# Patient Record
Sex: Male | Born: 1945
Health system: Southern US, Community
[De-identification: ages and names within clinical notes are randomized; demographics above are authoritative.]

## PROBLEM LIST (undated history)

## (undated) DIAGNOSIS — R918 Other nonspecific abnormal finding of lung field: Secondary | ICD-10-CM

## (undated) DIAGNOSIS — R911 Solitary pulmonary nodule: Secondary | ICD-10-CM

## (undated) DIAGNOSIS — C3490 Malignant neoplasm of unspecified part of unspecified bronchus or lung: Secondary | ICD-10-CM

## (undated) DIAGNOSIS — R112 Nausea with vomiting, unspecified: Secondary | ICD-10-CM

## (undated) DIAGNOSIS — Z9889 Other specified postprocedural states: Secondary | ICD-10-CM

## (undated) DIAGNOSIS — I509 Heart failure, unspecified: Secondary | ICD-10-CM

## (undated) DIAGNOSIS — J449 Chronic obstructive pulmonary disease, unspecified: Secondary | ICD-10-CM

## (undated) HISTORY — PX: BACK SURGERY: SHX140

## (undated) HISTORY — DX: Solitary pulmonary nodule: R91.1

## (undated) HISTORY — DX: Other nonspecific abnormal finding of lung field: R91.8

## (undated) HISTORY — DX: Malignant neoplasm of unspecified part of unspecified bronchus or lung: C34.90

---

## 2010-07-06 ENCOUNTER — Other Ambulatory Visit: Payer: Self-pay | Admitting: Family Medicine

## 2010-07-06 DIAGNOSIS — R918 Other nonspecific abnormal finding of lung field: Secondary | ICD-10-CM

## 2010-07-12 ENCOUNTER — Encounter (HOSPITAL_COMMUNITY)
Admission: RE | Admit: 2010-07-12 | Discharge: 2010-07-12 | Disposition: A | Discharge status: Home / Self Care | Payer: Medicare Other | Source: Ambulatory Visit | Attending: Family Medicine | Admitting: Family Medicine

## 2010-07-12 DIAGNOSIS — J984 Other disorders of lung: Secondary | ICD-10-CM | POA: Insufficient documentation

## 2010-07-12 DIAGNOSIS — R918 Other nonspecific abnormal finding of lung field: Secondary | ICD-10-CM

## 2010-07-12 LAB — GLUCOSE, CAPILLARY: Glucose-Capillary: 118 mg/dL — ABNORMAL HIGH (ref 70–99)

## 2010-07-12 MED ORDER — FLUDEOXYGLUCOSE F - 18 (FDG) INJECTION
15.0000 | Freq: Once | INTRAVENOUS | Status: AC | PRN
  Administered 2010-07-12: 15 via INTRAVENOUS

## 2010-07-18 ENCOUNTER — Encounter: Payer: Self-pay | Admitting: Thoracic Surgery

## 2010-07-19 ENCOUNTER — Encounter (INDEPENDENT_AMBULATORY_CARE_PROVIDER_SITE_OTHER): Payer: Medicare Other | Admitting: Thoracic Surgery

## 2010-07-19 DIAGNOSIS — D381 Neoplasm of uncertain behavior of trachea, bronchus and lung: Secondary | ICD-10-CM

## 2010-07-20 NOTE — Letter (Signed)
July 19, 2010  Ernestina Penna, MD [address] [place], [place] [address]  Re:  KAY, RICCIUTI               DOB:  May 30, 1945  Dear Roe Coombs,  I appreciate the opportunity of seeing the patient.  This 65 year old patient was found to have a left lower lobe nodule that is spiculated and approximately 10 cm positive in size.  This was seen on CT scan. There was some other small nodules in the base of the left lower lobe and right lower lobe.  He underwent a PET scan and the left lower lobe nodule was positive, the other nodules being negative.  The 8-mm nodule had an uptake value of 4.6.  He is referred here for evaluation.  He still smokes quarter pack of cigarettes a day.  He has had no hemoptysis, fever, chills, or excessive sputum.  He says he can barely get up a flight of stairs.  MEDICATIONS: 1. He is on Celebrex 200 mg a day. 2. Spiriva 18 mcg a day. 3. Ventolin. 4. Cymbalta 30 mg daily. 5. Flexeril p.r.n. 6. Aspirin.  He has no allergies.  He has severe COPD.  FAMILY HISTORY:  Negative.  SOCIAL HISTORY:  Single, has four children.  Smokes one half to one pack a day, does not drink alcohol on a regular basis.  REVIEW OF SYSTEMS:  He has had some weight loss. CARDIAC:  He has severe shortness of breath.  No angina. PULMONARY:  See history of present illness. GI:  No reflux. GU:  No kidney disease, dysuria, or frequent urination. VASCULAR:  No claudication, DVT, TIAs. NEUROLOGICAL:  He has got headaches. MUSCULOSKELETAL:  Arthritis. PSYCHIATRIC:  Anxiety. EYE AND ENT:  No changes in eyesight or hearing. HEMATOLOGICAL:  No problems with bleeding, clotting disorders, or anemia.  On physical exam, he is a thin Caucasian male in no acute distress.  His blood pressure is 132/84, pulse 92, respirations 20, sats were 97%. Head, eyes, ears, nose, and throat are unremarkable.  Neck is supple without thyromegaly.  He has no supraclavicular or axillary adenopathy. Chest:   Distant breath sounds with some bilateral wheezes.  Heart: Regular sinus rhythm.  Abdomen is soft.  No hepatosplenomegaly. Extremities:  Pulses 2+.  There is no clubbing or edema. Neurologically, he is oriented x3.  His sensory and motor is intact. Cranial nerves intact.  I think that probably he has an early lung cancer.  The problem would be getting a diagnosis of this without the surgery.  I will plan to get pulmonary function tests with DLCO and a brain scan and then we will see him back again next week for further evaluation.  Ines Bloomer, M.D. Electronically Signed  DPB/MEDQ  D:  07/19/2010  T:  07/20/2010  Job:  [id number]  cc:   Belva Agee, Nurse Practitioner

## 2010-07-26 ENCOUNTER — Ambulatory Visit (HOSPITAL_COMMUNITY)
Admission: RE | Admit: 2010-07-26 | Discharge: 2010-07-26 | Disposition: A | Discharge status: Home / Self Care | Payer: Medicare Other | Source: Ambulatory Visit | Attending: Thoracic Surgery | Admitting: Thoracic Surgery

## 2010-07-26 ENCOUNTER — Ambulatory Visit (INDEPENDENT_AMBULATORY_CARE_PROVIDER_SITE_OTHER): Payer: Medicare Other | Admitting: Thoracic Surgery

## 2010-07-26 ENCOUNTER — Ambulatory Visit: Payer: Medicare Other | Admitting: Thoracic Surgery

## 2010-07-26 DIAGNOSIS — J984 Other disorders of lung: Secondary | ICD-10-CM | POA: Insufficient documentation

## 2010-07-26 DIAGNOSIS — D381 Neoplasm of uncertain behavior of trachea, bronchus and lung: Secondary | ICD-10-CM

## 2010-07-27 NOTE — Assessment & Plan Note (Signed)
OFFICE VISIT  ELIAB, CLOSSON DOB:  1946/01/05                                        July 26, 2010 CHART #:  [id number]  I saw the patient back today and his brain scan is negative and his PET scan shows that he had a spiculated nodule in the left lower lobe.  His pulmonary function test showed an FVC of 1.81 or 51% of predicted and FVC of 4.08 or 86% of predicted with diffusion capacity corrected 52%. He is a candidate for at least lobe segmentectomy or possible lobectomy and we will schedule him for this to be done on Aug 24, 2010, at [facility].  I appreciate the opportunity of seeing the patient.  Ines Bloomer, M.D. Electronically Signed  DPB/MEDQ  D:  07/26/2010  T:  07/27/2010  Job:  [id number]  cc:   Ernestina Penna, M.D.

## 2010-08-22 ENCOUNTER — Ambulatory Visit (HOSPITAL_COMMUNITY)
Admission: RE | Admit: 2010-08-22 | Discharge: 2010-08-22 | Disposition: A | Discharge status: Home / Self Care | Payer: Medicare Other | Source: Ambulatory Visit | Attending: Thoracic Surgery | Admitting: Thoracic Surgery

## 2010-08-22 ENCOUNTER — Other Ambulatory Visit: Payer: Self-pay | Admitting: Thoracic Surgery

## 2010-08-22 ENCOUNTER — Encounter (HOSPITAL_COMMUNITY)
Admission: RE | Admit: 2010-08-22 | Discharge: 2010-08-22 | Disposition: A | Discharge status: Home / Self Care | Payer: Medicare Other | Source: Ambulatory Visit | Attending: Thoracic Surgery | Admitting: Thoracic Surgery

## 2010-08-22 DIAGNOSIS — Z01812 Encounter for preprocedural laboratory examination: Secondary | ICD-10-CM | POA: Insufficient documentation

## 2010-08-22 DIAGNOSIS — Z01811 Encounter for preprocedural respiratory examination: Secondary | ICD-10-CM

## 2010-08-22 DIAGNOSIS — Z01810 Encounter for preprocedural cardiovascular examination: Secondary | ICD-10-CM | POA: Insufficient documentation

## 2010-08-22 DIAGNOSIS — Z01818 Encounter for other preprocedural examination: Secondary | ICD-10-CM | POA: Insufficient documentation

## 2010-08-22 DIAGNOSIS — J984 Other disorders of lung: Secondary | ICD-10-CM | POA: Insufficient documentation

## 2010-08-22 LAB — COMPREHENSIVE METABOLIC PANEL
ALT: 9 U/L (ref 0–53)
Alkaline Phosphatase: 73 U/L (ref 39–117)
BUN: 12 mg/dL (ref 6–23)
CO2: 23 mEq/L (ref 19–32)
GFR calc non Af Amer: >60 mL/min (ref >60)
Glucose, Bld: 107 mg/dL — ABNORMAL HIGH (ref 70–99)
Potassium: 4.1 mEq/L (ref 3.5–5.1)
Sodium: 138 mEq/L (ref 135–145)
Total Bilirubin: 0.4 mg/dL (ref 0.3–1.2)
Total Protein: 6.3 g/dL (ref 6.0–8.3)

## 2010-08-22 LAB — URINALYSIS, ROUTINE W REFLEX MICROSCOPIC
Glucose, UA: NEGATIVE mg/dL
Hgb urine dipstick: NEGATIVE
Specific Gravity, Urine: 1.012 (ref 1.005–1.030)
Urobilinogen, UA: 0.2 mg/dL (ref 0.0–1.0)

## 2010-08-22 LAB — CBC
Platelets: 129 10*3/uL — ABNORMAL LOW (ref 150–400)
RBC: 5.15 MIL/uL (ref 4.22–5.81)
WBC: 7.7 10*3/uL (ref 4.0–10.5)

## 2010-08-22 LAB — ABO/RH: ABO/RH(D): A POS

## 2010-08-22 LAB — SURGICAL PCR SCREEN: Staphylococcus aureus: NEGATIVE

## 2010-08-22 LAB — BLOOD GAS, ARTERIAL
Acid-Base Excess: 0.5 mmol/L (ref 0.0–2.0)
Bicarbonate: 24 mEq/L (ref 20.0–24.0)
O2 Saturation: 97.8 %
pCO2 arterial: 35.1 mmHg (ref 35.0–45.0)
pO2, Arterial: 88.8 mmHg (ref 80.0–100.0)

## 2010-08-22 LAB — PROTIME-INR
INR: 1.07 (ref 0.00–1.49)
Prothrombin Time: 14.1 seconds (ref 11.6–15.2)

## 2010-08-22 LAB — TYPE AND SCREEN: Antibody Screen: NEGATIVE

## 2010-08-22 NOTE — H&P (Signed)
Stephen Fitzpatrick, Stephen Fitzpatrick               ACCOUNT NO.:  [id number]  MEDICAL RECORD NO.:  [id number]           PATIENT TYPE:  I  LOCATION:  [place]                         FACILITY:  [facility]  PHYSICIAN:  Ines Bloomer, M.D. DATE OF BIRTH:  03-09-1946  DATE OF ADMISSION:  08/24/2010 DATE OF DISCHARGE:                             HISTORY & PHYSICAL   CHIEF COMPLAINT:  Left lower lobe lung mass.  HISTORY OF PRESENT ILLNESS:  This 65 year old patient is a long-term smoker who was found to have a left lower lobe mass 10 mm in size. Standard uptake value of 4.8 on a PET scan that measured 8-10 mm in size.  He has had no hemoptysis, fever, chills, or excessive sputum, but he says he can get up a flight of stairs without stopping, but just barely.  His pulmonary function tests showed an FVC of 4.08 or 80% predicted and FEV-1 of 1.81 or 51% predicted and a diffusion capacity of 52%.  MEDICATIONS: 1. Celebrex 200 mg daily. 2. Spiriva 18 mcg daily. 3. Ventolin. 4. Cymbalta 30 mg daily. 5. Flexeril. 6. Aspirin.  ALLERGIES:  He has had no allergies.  PAST MEDICAL HISTORY:  He has severe chronic obstructive pulmonary disease.  FAMILY HISTORY:  Noncontributory.  SOCIAL HISTORY:  He has 4 children.  He does smoke a half a pack to a pack of cigarettes a day and he is supposed to quit.  He does not drink alcohol on a regular basis.  REVIEW OF SYSTEMS:  He has some weight loss.  CARDIAC:  Severe shortness of breath.  No angina.  PULMONARY:  See history of present illness.  GI: No reflux.  GU:  No kidney disease, dysuria, or frequent urination. VASCULAR:  No claudication, DVT, or TIAs.  NEUROLOGICAL:  He has headaches.  MUSCULOSKELETAL:  Arthritis and psychiatric anxiety. EYES/ENT:  No changes in eyesight or hearing.  HEMATOLOGICAL:  No problems with bleeding, clotting disorders, or anemia.  PHYSICAL EXAMINATION:  GENERAL:  He is a Caucasian male, in no acute distress. VITAL SIGNS:  Blood  pressure was 132/80, pulse 90, respirations 18, and sats were 96%. HEAD:  Atraumatic. EYES:  Pupils are equal and reactive to light and accommodation. Extraocular movements are normal EARS:  Tympanic membranes are intact. NOSE:  There is no septal deviation. THROAT:  Without lesion.  Uvula is in midline. NECK:  Supple without thyromegaly.  There is no supraclavicular or axillary adenopathy. CHEST:  Clear to auscultation bilaterally.  She has bilateral wheezes with distant breath sounds. HEART:  Regular sinus rhythm.  No murmurs. ABDOMEN:  Soft with no hepatosplenomegaly. EXTREMITIES:  Pulses 2+.  There is no clubbing or edema. NEUROLOGICAL:  He is oriented x3.  Sensory and motor intact.  Cranial nerves intact.  IMPRESSION: 1. Left lower lobe mass, positive on PET. 2. History of tobacco abuse. 3. Mild-to-severe chronic obstructive pulmonary disease.  PLAN:  Left VATS, Wedge versus segmentectomy of left lower lobe lesion.     Ines Bloomer, M.D.     DPB/MEDQ  D:  08/21/2010  T:  08/22/2010  Job:  [id number]  Electronically Signed by  Jovita Gamma M.D. on 08/22/2010 12:03:15 PM

## 2010-08-24 ENCOUNTER — Inpatient Hospital Stay (HOSPITAL_COMMUNITY): Payer: Medicare Other

## 2010-08-24 ENCOUNTER — Inpatient Hospital Stay (HOSPITAL_COMMUNITY)
Admission: RE | Admit: 2010-08-24 | Discharge: 2010-09-02 | DRG: 164 | Disposition: A | Discharge status: Home Health | Payer: Medicare Other | Source: Ambulatory Visit | Attending: Thoracic Surgery | Admitting: Thoracic Surgery

## 2010-08-24 ENCOUNTER — Other Ambulatory Visit: Payer: Self-pay | Admitting: Thoracic Surgery

## 2010-08-24 DIAGNOSIS — I10 Essential (primary) hypertension: Secondary | ICD-10-CM | POA: Diagnosis not present

## 2010-08-24 DIAGNOSIS — F17200 Nicotine dependence, unspecified, uncomplicated: Secondary | ICD-10-CM | POA: Diagnosis present

## 2010-08-24 DIAGNOSIS — C3430 Malignant neoplasm of lower lobe, unspecified bronchus or lung: Principal | ICD-10-CM | POA: Diagnosis present

## 2010-08-24 DIAGNOSIS — J4489 Other specified chronic obstructive pulmonary disease: Secondary | ICD-10-CM | POA: Diagnosis present

## 2010-08-24 DIAGNOSIS — D381 Neoplasm of uncertain behavior of trachea, bronchus and lung: Secondary | ICD-10-CM

## 2010-08-24 DIAGNOSIS — F19951 Other psychoactive substance use, unspecified with psychoactive substance-induced psychotic disorder with hallucinations: Secondary | ICD-10-CM | POA: Diagnosis not present

## 2010-08-24 DIAGNOSIS — J449 Chronic obstructive pulmonary disease, unspecified: Secondary | ICD-10-CM | POA: Diagnosis present

## 2010-08-24 HISTORY — PX: OTHER SURGICAL HISTORY: SHX169

## 2010-08-24 HISTORY — DX: Chronic obstructive pulmonary disease, unspecified: J44.9

## 2010-08-25 ENCOUNTER — Inpatient Hospital Stay (HOSPITAL_COMMUNITY): Payer: Medicare Other

## 2010-08-25 LAB — POCT I-STAT 3, ART BLOOD GAS (G3+)
Acid-Base Excess: 1 mmol/L (ref 0.0–2.0)
O2 Saturation: 87 %
Patient temperature: 98.5
TCO2: 28 mmol/L (ref 0–100)
pCO2 arterial: 44.6 mmHg (ref 35.0–45.0)

## 2010-08-25 LAB — BASIC METABOLIC PANEL
BUN: 6 mg/dL (ref 6–23)
Calcium: 8.1 mg/dL — ABNORMAL LOW (ref 8.4–10.5)
Chloride: 100 mEq/L (ref 96–112)
Creatinine, Ser: 0.74 mg/dL (ref 0.4–1.5)
GFR calc Af Amer: >60 mL/min (ref >60)

## 2010-08-25 LAB — CBC
MCH: 28.1 pg (ref 26.0–34.0)
MCHC: 32.9 g/dL (ref 30.0–36.0)
MCV: 85.5 fL (ref 78.0–100.0)
Platelets: 110 10*3/uL — ABNORMAL LOW (ref 150–400)
RBC: 4.13 MIL/uL — ABNORMAL LOW (ref 4.22–5.81)
RDW: 13.8 % (ref 11.5–15.5)

## 2010-08-26 ENCOUNTER — Inpatient Hospital Stay (HOSPITAL_COMMUNITY): Payer: Medicare Other

## 2010-08-26 LAB — CBC
MCV: 85.5 fL (ref 78.0–100.0)
Platelets: 130 10*3/uL — ABNORMAL LOW (ref 150–400)
RBC: 4.55 MIL/uL (ref 4.22–5.81)
RDW: 13.7 % (ref 11.5–15.5)
WBC: 11 10*3/uL — ABNORMAL HIGH (ref 4.0–10.5)

## 2010-08-26 LAB — COMPREHENSIVE METABOLIC PANEL
AST: 20 U/L (ref 0–37)
Albumin: 2.8 g/dL — ABNORMAL LOW (ref 3.5–5.2)
Alkaline Phosphatase: 65 U/L (ref 39–117)
BUN: 7 mg/dL (ref 6–23)
Creatinine, Ser: 0.7 mg/dL (ref 0.4–1.5)
GFR calc Af Amer: >60 mL/min (ref >60)
Potassium: 3.9 mEq/L (ref 3.5–5.1)
Total Protein: 5.8 g/dL — ABNORMAL LOW (ref 6.0–8.3)

## 2010-08-27 ENCOUNTER — Inpatient Hospital Stay (HOSPITAL_COMMUNITY): Payer: Medicare Other

## 2010-08-28 ENCOUNTER — Inpatient Hospital Stay (HOSPITAL_COMMUNITY): Payer: Medicare Other

## 2010-08-28 NOTE — Op Note (Signed)
  NAMERONNALD, SHEDDEN               ACCOUNT NO.:  [id number]  MEDICAL RECORD NO.:  [id number]           PATIENT TYPE:  I  LOCATION:  2304                         FACILITY:  [facility]  PHYSICIAN:  Ines Bloomer, M.D. DATE OF BIRTH:  25-Sep-1945  DATE OF PROCEDURE: DATE OF DISCHARGE:                              OPERATIVE REPORT   PREOPERATIVE DIAGNOSES:  Left upper lobe mass, a left lower lobe nodule, severe chronic obstructive pulmonary disease.  POSTOPERATIVE DIAGNOSES:  Left upper lobe mass, a left lower lobe nodule, severe chronic obstructive pulmonary disease.  OPERATIONS PERFORMED:  Left video-assisted thoracic surgery, resection of left lower lobe lesion with node sampling.  SURGEON:  Ines Bloomer, MD  FIRST ASSISTANT:  Coral Ceo, PA-C  ANESTHESIA:  General.  This 65 year old smoker was found to have a left lower lobe lesion that was positive on pad.  It was in the superior segment.  His pulmonary function test was satisfactory for lobectomy but he had evidence of severe chronic obstructive pulmonary disease.  He underwent general anesthesia.  He was turned left lateral thoracotomy position, was prepped and draped in usual sterile manner.  Dual-lumen tube was inserted in the left lung was deflated.  Two trocar sites were made in the anterior and posterior axons seventh intercostal space.  Two trocars were inserted and then a third 4-5 cm incision was made at the fifth intercostal space.  The latissimus was partially divided.  The serratus was split and the lesion was palpated in the posterior and superior segment of the left lower lobe.  We then took a Games developer with a Seamguard reinforcement and fired it along the staple line and then came in laterally with Covidien stapler with Seamguard reinforcement and resected the lesion with at least 2.5 cm margins around it.  However at the end of the staple line immediately in the fissure, there was tearing in the  left upper lobe and we had to repair this with horizontal mattress pledgeted sutures with 3-0 Prolene and this reduced the air leak considerably.  We then went posteriorly and dissected out 5 lymph node superior from the aortopulmonary window.  Then down along the trachea we dissected a 11L node and then more medially a 4L node.  It has sent for analysis and we then put ProGEL and CoSeal  to the staple lines to decrease the air leak as much as possible.  Two chest tubes were placed, a 24 anteriorly and 28 posteriorly.  Single On-Q was inserted in the usual fashion.  The chest was closed with 2 pericostals, #1 Vicryl in the muscle layer, 3-0 Vicryl subcutaneous stitch.  The patient was returned to recovery room in stable condition.     Ines Bloomer, M.D.     DPB/MEDQ  D:  08/24/2010  T:  08/24/2010  Job:  [id number]  Electronically Signed by Jovita Gamma M.D. on 08/28/2010 02:19:00 PM

## 2010-08-29 ENCOUNTER — Inpatient Hospital Stay (HOSPITAL_COMMUNITY): Payer: Medicare Other

## 2010-08-30 ENCOUNTER — Inpatient Hospital Stay (HOSPITAL_COMMUNITY): Payer: Medicare Other

## 2010-08-30 LAB — COMPREHENSIVE METABOLIC PANEL
ALT: 9 U/L (ref 0–53)
AST: 15 U/L (ref 0–37)
Albumin: 2.5 g/dL — ABNORMAL LOW (ref 3.5–5.2)
Alkaline Phosphatase: 87 U/L (ref 39–117)
Glucose, Bld: 137 mg/dL — ABNORMAL HIGH (ref 70–99)
Potassium: 4.2 mEq/L (ref 3.5–5.1)
Sodium: 132 mEq/L — ABNORMAL LOW (ref 135–145)
Total Protein: 6.2 g/dL (ref 6.0–8.3)

## 2010-08-30 LAB — CBC
HCT: 41.8 % (ref 39.0–52.0)
RDW: 13.4 % (ref 11.5–15.5)
WBC: 23.8 10*3/uL — ABNORMAL HIGH (ref 4.0–10.5)

## 2010-08-31 ENCOUNTER — Inpatient Hospital Stay (HOSPITAL_COMMUNITY): Payer: Medicare Other

## 2010-08-31 ENCOUNTER — Encounter (HOSPITAL_COMMUNITY): Payer: Self-pay | Admitting: Radiology

## 2010-08-31 MED ORDER — IOHEXOL 300 MG/ML  SOLN
75.0000 mL | Freq: Once | INTRAMUSCULAR | Status: AC | PRN
  Administered 2010-08-31: 75 mL via INTRAVENOUS

## 2010-09-01 ENCOUNTER — Inpatient Hospital Stay (HOSPITAL_COMMUNITY): Payer: Medicare Other

## 2010-09-01 LAB — CBC
HCT: 34 % — ABNORMAL LOW (ref 39.0–52.0)
Hemoglobin: 11.4 g/dL — ABNORMAL LOW (ref 13.0–17.0)
MCH: 27.5 pg (ref 26.0–34.0)
RBC: 4.14 MIL/uL — ABNORMAL LOW (ref 4.22–5.81)

## 2010-09-01 LAB — COMPREHENSIVE METABOLIC PANEL
ALT: 9 U/L (ref 0–53)
AST: 15 U/L (ref 0–37)
CO2: 30 mEq/L (ref 19–32)
Chloride: 93 mEq/L — ABNORMAL LOW (ref 96–112)
GFR calc Af Amer: >60 mL/min (ref >60)
GFR calc non Af Amer: >60 mL/min (ref >60)
Glucose, Bld: 97 mg/dL (ref 70–99)
Sodium: 131 mEq/L — ABNORMAL LOW (ref 135–145)
Total Bilirubin: 0.4 mg/dL (ref 0.3–1.2)

## 2010-09-02 ENCOUNTER — Inpatient Hospital Stay (HOSPITAL_COMMUNITY): Payer: Medicare Other

## 2010-09-02 LAB — CBC
MCHC: 33.4 g/dL (ref 30.0–36.0)
Platelets: 250 10*3/uL (ref 150–400)
RDW: 13.7 % (ref 11.5–15.5)

## 2010-09-05 ENCOUNTER — Other Ambulatory Visit: Payer: Self-pay | Admitting: Thoracic Surgery

## 2010-09-05 DIAGNOSIS — D381 Neoplasm of uncertain behavior of trachea, bronchus and lung: Secondary | ICD-10-CM

## 2010-09-06 ENCOUNTER — Ambulatory Visit: Payer: Self-pay | Admitting: Thoracic Surgery

## 2010-09-06 NOTE — Discharge Summary (Signed)
Stephen Fitzpatrick, Stephen Fitzpatrick               ACCOUNT NO.:  [id number]  MEDICAL RECORD NO.:  [id number]           PATIENT TYPE:  I  LOCATION:  3309                         FACILITY:  [facility]  PHYSICIAN:  Ines Bloomer, M.D. DATE OF BIRTH:  1945-08-08  DATE OF ADMISSION:  08/24/2010 DATE OF DISCHARGE:  09/01/2010                              DISCHARGE SUMMARY   ADMITTING DIAGNOSES: 1. Left lower lobe lung mass. 2. History of chronic obstructive pulmonary disease. 3. History of tobacco abuse.  DISCHARGE DIAGNOSES: 1. Adenocarcinoma of the left lower lobe. 2. History of chronic obstructive pulmonary disease. 3. History of tobacco abuse.  PROCEDURE:  Left video-assisted thoracoscopic surgery, left mini thoracotomy, left lower lobe superior segmentectomy with lymph node dissection and On-Q placement by Dr. Edwyna Shell on Aug 24, 2010.  PATHOLOGY RESULTS:  Invasive well differentiated adenocarcinoma of the left lower lobe, margins negative for tumor.  Lymph nodes also negative for tumor.  HISTORY OF PRESENT ILLNESS:  This is a 65 year old Caucasian male who was initially seen and evaluated by Dr. Edwyna Shell in the office on July 19, 2010, regarding a left lower lobe nodule (that was positive on PET scan).  Specifically, the patient had an SUV max of 4.6 of the left lower lobe nodule which measured 7 mm and was spiculated on PET scan. There was no evidence of mediastinal metastasis or distant metastasis but there was right perihilar linear metabolic activity that was indeterminate and might be inflammatory.  The patient then underwent PFTs.  Results indicated an FVC of 1.81 or 51% of predicted, an FVC of 4.08 or 86% of predicted and a diffusion capacity corrected to 52%.  He was found to be a candidate for at least a left lower lobe segmentectomy and a possible lobectomy (if needed).  Potential risks, complications and benefits of the surgery were discussed with the patient who agreed to proceed  with surgery and was admitted to [facility] on Aug 24, 2010, in order to undergo a left mini thoracotomy, left lower lobe superior segmentectomy with lymph node dissection by Dr. Edwyna Shell.  BRIEF HOSPITAL COURSE STAY:  The patient remained afebrile and hemodynamically stable.  He was found initially to have a small air leak.  His A-line and Foley were removed early in his postop course.  He was transferred from Intensive Care into 3300 for further convalescence later on Aug 25, 2010.  He then developed a 2-3 air leak with cough.  As a result, chest tubes remained in for a couple of days.  His air leak did gradually diminish.  His posterior chest tube was removed on Aug 29, 2010.  Chest x-ray revealed a questionable tiny left apical pneumothorax with some subcutaneous emphysema on the left lateral chest wall, remaining chest tube was placed to [facility].  There was no obvious air leak.  His chest x-ray remained stable and the remaining chest tube was removed on Aug 31, 2010.  It should be noted that the patient did develop some confusion and hallucinations.  All narcotics had  been discontinued and he was given either Tylenol or Ultram p.r.n. for pain.  He  was  initially placed on Xanax but that was discontinued.  He was then placed on Ativan.  He remained anxious and confused.  The Ativan was changed to p.r.n. and he was given Haldol; however, he still remained pleasantly confused and with hallucinations at times.    He has no history of alcohol abuse and his neuro exam shows no focal deficits.  His last white count was found to be increased at 23,800 on 08/30/2010.  He remained afebrile.  No signs of wound infection and no GU complaints.  A CBC will be checked in the morning .  In addition, the patient did have tachycardia.  He was started on low-dose Lopressor, however, this needed to be increased today as he remained tachycardic.  He is currently on 25 mg p.o. two times daily and will  be discharged home on this.  Currently on postop day #7, he has been tolerating a diet.  He is ambulating with assistance.  He is passing flatus but has not had a bowel movement and will be given lactulose for this.  Provided he remains afeb, cxr remains stable and his is less confused and without hallucinations, he will be able to be discharged in the am or Saturday.  PHYSICAL EXAMINATION:  VITAL SIGNS:  Afebrile, heart rate 100, BP 129/94, O2 92% on room air. CARDIOVASCULAR:  Tachycardic. PULMONARY:  Decreased on left base, right lung is clear. ABDOMEN:  Soft, nontender.  Bowel sounds are present. EXTREMITIES:  No lower extremity edema.  Dressing on his wound is clean and dry.  Provided he remains afebrile and hemodynamically stable chest x-ray remains stable.  On pending morning round evaluation was surgically stable for discharge on Sep 01, 2010.  LATEST LABORATORY STUDIES:  CMP done on Aug 30, 2010, potassium 4.2, sodium 132, BUN and creatinine 18 and 0.72 respectively.  LFTs within normal limits.  CBC done on this date H ands H 14.1 and 41.1 respectively, white count of 23,800, platelet count 214,000.  Last chest x-ray done on Aug 31, 2010, showed no pneumothorax, underlying COPD, subcutaneous emphysema left chest wall as before.  DISCHARGE INSTRUCTIONS:  Diet: Regular diet.  Activity:  May walk up steps.  He may shower.  He is not to lift more than 10 pounds for 2 weeks.  He is not to drive until after 2 weeks.  He is to continue with his breathing exercise daily, needs to walk daily, and increase his frequency of duration as he tolerates.  FOLLOWUP APPOINTMENTS:  He is to see Dr. Edwyna Shell on Sep 06, 2010, at 1:15 p.m.  45 minutes prior to this office appointment a chest x-ray will be obtained.  Chest tube sutures will be removed at this appointment.  WOUND CARE:  The patient is to use soap and water on his wounds.  He is to contact the office if any wound problems  arise.  DISCHARGE MEDICATIONS:  At the time of this dictation include: 1. Lopressor 25 mg p.o. two times daily. 2. Ultram 50 mg 1-2 tablets p.o. q.6 h. p.r.n. pain. 3. Celebrex 200 mg p.o. daily. 4. Cyclobenzaprine 10 mg p.o. two times daily. 5. Cymbalta 30 mg p.o. daily. 6. Rolaids over-the-counter 1 tablet p.o. daily p.r.n. 7. Spiriva 18 mcg inhaled daily. 8. Ventolin inhaler 2 puffs inhaled four times daily p.r.n.     Doree Fudge, PA   ______________________________ Ines Bloomer, M.D.    DZ/MEDQ  D:  08/31/2010  T:  09/01/2010  Job:  [id number]  cc:   Ernestina Penna, M.D.  Electronically Signed by Doree Fudge PA on 09/01/2010 08:54:06 AM Electronically Signed by Jovita Gamma M.D. on 09/06/2010 10:54:14 AM

## 2010-09-07 ENCOUNTER — Ambulatory Visit (INDEPENDENT_AMBULATORY_CARE_PROVIDER_SITE_OTHER): Payer: Self-pay | Admitting: Thoracic Surgery

## 2010-09-07 ENCOUNTER — Other Ambulatory Visit: Payer: Self-pay | Admitting: Thoracic Surgery

## 2010-09-07 ENCOUNTER — Ambulatory Visit
Admission: RE | Admit: 2010-09-07 | Discharge: 2010-09-07 | Disposition: A | Discharge status: Home / Self Care | Payer: Medicare Other | Source: Ambulatory Visit | Attending: Thoracic Surgery | Admitting: Thoracic Surgery

## 2010-09-07 DIAGNOSIS — C3490 Malignant neoplasm of unspecified part of unspecified bronchus or lung: Secondary | ICD-10-CM

## 2010-09-07 DIAGNOSIS — D381 Neoplasm of uncertain behavior of trachea, bronchus and lung: Secondary | ICD-10-CM

## 2010-09-08 NOTE — Assessment & Plan Note (Signed)
OFFICE VISIT  GOVERNOR, MATOS DOB:  1945-08-06                                        Sep 07, 2010 CHART #:  [id number]  HISTORY:  The patient is a 65 year old smoker who underwent a left video- assisted thoracoscopic surgery with resection of a left lower lobe lesion and lymph node sampling done on Aug 24, 2010.  This revealed an invasive well-differentiated adenocarcinoma 1.1 cm with no lymphatic spread.  The margins were also negative and no lymphovascular invasion was identified.  Currently, he reports that he is feeling quite poorly. He has a poor appetite.  He has some constipation.  He denies fevers, chills or other constitutional symptoms.  He has a slightly productive cough.  It is yellow sputum.  He has had some drainage from a chest tube site, which is purulent in nature.  He reports a fairly poor appetite. He is drinking fluids without significant difficulty.  Chest x-ray was obtained on today's date.  Does reveal some increase in the left side fluid with increased airspace disease.  LABORATORY DATA:  A CBC was obtained on today's day with a white blood cell count of 26,500, H and H of 12.5 and 38.8  PHYSICAL EXAMINATION:  Vital Signs:  Blood pressure is 122/81, pulse 96, respirations 20, oxygen saturation is 94% on room air, temperature is 97.4.  General Appearance:  This is a somewhat cachectic-appearing, chronically ill male who is in no distress.  Pulmonary examination reveals decreased breath sounds on the left side compared to right, but only fair air exchange throughout.  Cardiac examination, regular rate and rhythm.  A chest tube site does have moderate purulent drainage, brown in color.  There is minimal surrounding erythema.  The chest incision itself is healing well.  Extremities:  No edema.  ASSESSMENT:  The patient does appear to have a chest tube tract site infection with possible pneumonia.  He has significant leukocytosis.   A discussed the overall situation with the patient and he is hoping to remain treated as an outpatient.  I discuss that he is showing moderate degree of failure to thrive.  Basically, I discussed that he is very borderline as to the ability to treat this as an outpatient.  He does wish to try.  I will place him on oral Avelox 400 mg p.o. daily for 7 days.  Additionally, I instructed him on some over-the-counter medications for constipation as well as Ensure oral supplements.  I told him that he needs to be in close contact with our office if he feels worse or is not improving with this treatment.  He is on a very tight observation, and we will see him again in the office on next Tuesday if not prior depending on how he feels.  Rowe Clack, P.A.-C.  Sherryll Burger  D:  09/07/2010  T:  09/08/2010  Job:  [id number]  cc:   Ines Bloomer, M.D.

## 2010-09-11 ENCOUNTER — Other Ambulatory Visit: Payer: Self-pay | Admitting: Thoracic Surgery

## 2010-09-11 DIAGNOSIS — D381 Neoplasm of uncertain behavior of trachea, bronchus and lung: Secondary | ICD-10-CM

## 2010-09-12 ENCOUNTER — Ambulatory Visit
Admission: RE | Admit: 2010-09-12 | Discharge: 2010-09-12 | Disposition: A | Discharge status: Home / Self Care | Payer: Medicare Other | Source: Ambulatory Visit | Attending: Thoracic Surgery | Admitting: Thoracic Surgery

## 2010-09-12 ENCOUNTER — Ambulatory Visit (INDEPENDENT_AMBULATORY_CARE_PROVIDER_SITE_OTHER): Payer: Self-pay | Admitting: Thoracic Surgery

## 2010-09-12 DIAGNOSIS — C3490 Malignant neoplasm of unspecified part of unspecified bronchus or lung: Secondary | ICD-10-CM

## 2010-09-12 DIAGNOSIS — D381 Neoplasm of uncertain behavior of trachea, bronchus and lung: Secondary | ICD-10-CM

## 2010-09-13 NOTE — Assessment & Plan Note (Signed)
OFFICE VISIT  Stephen Fitzpatrick, Stephen Fitzpatrick DOB:  11/19/1945                                        September 12, 2010 CHART #:  [id number]  Mr. Stephen Fitzpatrick came for followup today.  He is still draining purulent material from the site of the posterior chest tube.His chest x-ray shows a decreased in air fluids levels posteriorly which was probably has been drained through the chest tube site.  He was seen in the emergency room and probably his blood pressure was 134/79, pulse 92, respirations 20, and saturations 95%.  His chest tube sutures and was switch him for Avelox and doxycycline and planned to see him back again in 1 week with a chest x-ray.  Ines Bloomer, M.D. Electronically Signed  DPB/MEDQ  D:  09/12/2010  T:  09/13/2010  Job:  [id number]

## 2010-09-15 NOTE — Discharge Summary (Signed)
  NAMEHARBERT, FITTERER               ACCOUNT NO.:  [id number]  MEDICAL RECORD NO.:  [id number]           PATIENT TYPE:  LOCATION:                                 FACILITY:  PHYSICIAN:  Ines Bloomer, M.D. DATE OF BIRTH:  05-12-1945  DATE OF ADMISSION:  08/24/2010 DATE OF DISCHARGE:  09/02/2010                              DISCHARGE SUMMARY   ADDENDUM  The patient did have some postop confusion, this was being followed. His confusion was noted to have increased, and CT of the brain was obtained on Aug 31, 2010.  This showed no evidence of metastatic disease of the brain.  There are subtle areas of hypoattenuation involving left globus pallidus and the internal capsule likely representing age indeterminate ischemic lesions.  Otherwise, negative CT of the brain. The patient's mental status was followed and his confusion started to resolve and improve.  On Sep 02, 2010, the patient is currently alert and oriented to person, place, and time.  The patient has been up ambulating with assistance.  Vital signs have been followed, and he has remained afebrile in normal sinus rhythm.  Blood pressure is stable.  O2 sats greater than 87% on room air.  Incisions:  The patient does have one chest tube site that does have some bloody drainage.  There are no signs of cellulitis or purulent drainage noted.  All other incisions are clean, dry, and intact and healing well.  Lab work obtained on Sep 02, 2010, showed white blood cell count 23.7, hematocrit 12.1, hemoglobin 36.2, platelet count 250.  The patient is tentatively ready for discharge to home following evaluation by MD.  Please see dictated discharge summary for followup appointments as well as discharge instructions and discharge medications.     Sol Blazing, PA   ______________________________ Ines Bloomer, M.D.    KMD/MEDQ  D:  09/02/2010  T:  09/02/2010  Job:  [id number]  Electronically Signed by Cameron Proud PA on 09/12/2010 10:02:04 AM Electronically Signed by Jovita Gamma M.D. on 09/15/2010 02:23:27 PM

## 2010-09-19 ENCOUNTER — Other Ambulatory Visit: Payer: Self-pay | Admitting: Thoracic Surgery

## 2010-09-19 DIAGNOSIS — D381 Neoplasm of uncertain behavior of trachea, bronchus and lung: Secondary | ICD-10-CM

## 2010-09-20 ENCOUNTER — Ambulatory Visit (INDEPENDENT_AMBULATORY_CARE_PROVIDER_SITE_OTHER): Payer: Self-pay | Admitting: Thoracic Surgery

## 2010-09-20 ENCOUNTER — Ambulatory Visit
Admission: RE | Admit: 2010-09-20 | Discharge: 2010-09-20 | Disposition: A | Discharge status: Home / Self Care | Payer: Medicare Other | Source: Ambulatory Visit | Attending: Thoracic Surgery | Admitting: Thoracic Surgery

## 2010-09-20 DIAGNOSIS — C3490 Malignant neoplasm of unspecified part of unspecified bronchus or lung: Secondary | ICD-10-CM

## 2010-09-20 DIAGNOSIS — D381 Neoplasm of uncertain behavior of trachea, bronchus and lung: Secondary | ICD-10-CM

## 2010-09-21 NOTE — Assessment & Plan Note (Signed)
OFFICE VISIT  SAMARI, GORBY J DOB:  [birth year]                                        September 20, 2010 CHART #:  [id number]  His weight is 126, his blood pressure is 118/80, pulse 84 and respirations 18.  His chest x-ray still shows a small area of the nodules in the left that were re-excised, but this seems to be decreased in size, and I think we will hopefully go ahead and resolve, to finish up his antibiotics and we will see him back again in 1 week with another chest x-ray.  Ines Bloomer, M.D. Electronically Signed  DPB/MEDQ  D:  09/20/2010  T:  09/21/2010  Job:  [id number]

## 2010-09-25 ENCOUNTER — Other Ambulatory Visit: Payer: Self-pay | Admitting: Thoracic Surgery

## 2010-09-25 DIAGNOSIS — D381 Neoplasm of uncertain behavior of trachea, bronchus and lung: Secondary | ICD-10-CM

## 2010-09-26 ENCOUNTER — Ambulatory Visit
Admission: RE | Admit: 2010-09-26 | Discharge: 2010-09-26 | Disposition: A | Discharge status: Home / Self Care | Payer: Medicare Other | Source: Ambulatory Visit | Attending: Thoracic Surgery | Admitting: Thoracic Surgery

## 2010-09-26 ENCOUNTER — Ambulatory Visit (INDEPENDENT_AMBULATORY_CARE_PROVIDER_SITE_OTHER): Payer: Self-pay | Admitting: Thoracic Surgery

## 2010-09-26 DIAGNOSIS — D381 Neoplasm of uncertain behavior of trachea, bronchus and lung: Secondary | ICD-10-CM

## 2010-09-26 DIAGNOSIS — C3490 Malignant neoplasm of unspecified part of unspecified bronchus or lung: Secondary | ICD-10-CM

## 2010-09-27 NOTE — Assessment & Plan Note (Signed)
OFFICE VISIT  Stephen Fitzpatrick, Stephen Fitzpatrick DOB:  06-12-1945                                        September 26, 2010 CHART #:  [id number]  HISTORY:  The patient has seen again in follow-up on today's date.  His history is well documented in the chart.  He underwent a left video- assisted thoracoscopic surgery with resection of a left lower lobe lesion and lymph node sampling on Aug 24, 2010.  This was an invasive well-differentiated adenocarcinoma 1.1 cm with no lymphatic spread.  The margins were also negative and no lymphovascular invasion was identified.  He has been feeling poor since surgery but on today's date he continues to show substantial improvement compared to previous office visits.  He continues to struggle with his appetite and it remains poor. He does, however, manage to take in a fair amount of Ensure supplements as well as drinking Gatorade which appears to be managing his nutrition fairly well.  He also notes that constipation continues to be an issue. He has undergone a course of 2 antibiotics since discharge, Avelox for 7 days and then a course of doxycycline.  Currently, he is having no fevers.  He denies chills.  The incisions had no further drainage. There is no erythema or warmth associated with the incisions.  Chest x-ray was obtained on today's date.  It continues to show improvement in the airspace disease.  PHYSICAL EXAMINATION:  Vital Signs:  Blood pressure is 129/84, pulse is 78, respirations 16, oxygen saturation is 95% on room air.  General Appearance:  A well-developed adult male who still appears chronically ill and somewhat cachectic.  He does look better compared over time, however.  Pulmonary:  Clear breath sounds although somewhat diminished throughout airways.  Cardiac:  Regular rate and rhythm.  Abdomen:  Soft, nontender.  Extremities:  No edema.  Incision is well-healed without evidence of infection as are chest tube  sites.  ASSESSMENT:  The patient is making steady albeit slow progress in his recovery.  I have encouraged him to continue laxatives and also take Colace as a stool softener.  I have encouraged him to increase his diet as he tolerates.  It is fairly unrestricted at this point as he needs calories and his family is providing a variety of meals and hopefully this will improve.  Meanwhile, he will continue to use supplements Ensure nutrition.  It is not felt that we need to keep him on antibiotics at this time.  We will see him again in the office in 3 weeks for another check as well as p.r.n. as necessary.  Rowe Clack, P.A.-C.  Sherryll Burger  D:  09/26/2010  T:  09/27/2010  Job:  [id number]

## 2010-10-17 ENCOUNTER — Other Ambulatory Visit: Payer: Self-pay | Admitting: Thoracic Surgery

## 2010-10-17 DIAGNOSIS — D381 Neoplasm of uncertain behavior of trachea, bronchus and lung: Secondary | ICD-10-CM

## 2010-10-18 ENCOUNTER — Ambulatory Visit
Admission: RE | Admit: 2010-10-18 | Discharge: 2010-10-18 | Disposition: A | Discharge status: Home / Self Care | Payer: Medicare Other | Source: Ambulatory Visit | Attending: Thoracic Surgery | Admitting: Thoracic Surgery

## 2010-10-18 ENCOUNTER — Ambulatory Visit (INDEPENDENT_AMBULATORY_CARE_PROVIDER_SITE_OTHER): Payer: Self-pay | Admitting: Thoracic Surgery

## 2010-10-18 DIAGNOSIS — D381 Neoplasm of uncertain behavior of trachea, bronchus and lung: Secondary | ICD-10-CM

## 2010-10-18 DIAGNOSIS — C3490 Malignant neoplasm of unspecified part of unspecified bronchus or lung: Secondary | ICD-10-CM

## 2010-10-18 NOTE — Assessment & Plan Note (Signed)
OFFICE VISIT  Stephen, Fitzpatrick DOB:  May 06, 1945                                        October 18, 2010 CHART #:  [id number]  The patient has returned today.  His blood pressure is 125/82, pulse 96, respirations 20, and sats were 100%.  Incisions are well healed.  Chest x-ray showed normal postoperative changes.  He says he is still having some mild pain.  We gave him a refill for tramadol #60 and he is not sleeping well.  We gave him a prescription for pain medication.  He also complains of shortness of breath especially in the heat, suggested to see his medical doctor, to see if he needs to get back on inhalers.  I will plan to see him back again in 6 weeks with a chest x-ray.  Ines Bloomer, M.D. Electronically Signed  DPB/MEDQ  D:  10/18/2010  T:  10/18/2010  Job:  [id number]  cc:   Belva Agee, NP

## 2010-11-29 ENCOUNTER — Ambulatory Visit: Payer: Medicare Other | Admitting: Thoracic Surgery

## 2010-12-06 ENCOUNTER — Ambulatory Visit: Payer: Medicare Other | Admitting: Thoracic Surgery

## 2010-12-07 ENCOUNTER — Other Ambulatory Visit: Payer: Self-pay | Admitting: Thoracic Surgery

## 2010-12-07 DIAGNOSIS — C3430 Malignant neoplasm of lower lobe, unspecified bronchus or lung: Secondary | ICD-10-CM

## 2010-12-07 DIAGNOSIS — C3410 Malignant neoplasm of upper lobe, unspecified bronchus or lung: Secondary | ICD-10-CM

## 2010-12-12 DIAGNOSIS — R911 Solitary pulmonary nodule: Secondary | ICD-10-CM

## 2010-12-12 DIAGNOSIS — R918 Other nonspecific abnormal finding of lung field: Secondary | ICD-10-CM

## 2010-12-13 ENCOUNTER — Ambulatory Visit: Payer: Medicare Other | Admitting: Thoracic Surgery

## 2010-12-15 ENCOUNTER — Other Ambulatory Visit: Payer: Self-pay | Admitting: Thoracic Surgery

## 2010-12-15 DIAGNOSIS — C3430 Malignant neoplasm of lower lobe, unspecified bronchus or lung: Secondary | ICD-10-CM

## 2010-12-15 DIAGNOSIS — C3410 Malignant neoplasm of upper lobe, unspecified bronchus or lung: Secondary | ICD-10-CM

## 2010-12-19 ENCOUNTER — Ambulatory Visit (INDEPENDENT_AMBULATORY_CARE_PROVIDER_SITE_OTHER): Payer: Medicare Other | Admitting: Thoracic Surgery

## 2010-12-19 ENCOUNTER — Ambulatory Visit
Admission: RE | Admit: 2010-12-19 | Discharge: 2010-12-19 | Disposition: A | Discharge status: Home / Self Care | Payer: Medicare Other | Source: Ambulatory Visit | Attending: Thoracic Surgery | Admitting: Thoracic Surgery

## 2010-12-19 VITALS — BP 139/86 | HR 98 | Resp 16 | Ht 74.0 in | Wt 132.0 lb

## 2010-12-19 DIAGNOSIS — C3490 Malignant neoplasm of unspecified part of unspecified bronchus or lung: Secondary | ICD-10-CM

## 2010-12-19 DIAGNOSIS — C3430 Malignant neoplasm of lower lobe, unspecified bronchus or lung: Secondary | ICD-10-CM

## 2010-12-19 DIAGNOSIS — C3410 Malignant neoplasm of upper lobe, unspecified bronchus or lung: Secondary | ICD-10-CM

## 2010-12-19 NOTE — Progress Notes (Signed)
HPI the patient returns for followup. His hydropneumothorax has resolved. His incision is well healed we gave him a refill for Ambien and Ultram. He will followup with his medical doctor regarding his blood pressure medications. I have scheduled a followup CT scan in 2 months. I refer him to Dr. Cleone Slim for evaluation.   Current Outpatient Prescriptions  Medication Sig Dispense Refill  . albuterol (PROVENTIL HFA;VENTOLIN HFA) 108 (90 BASE) MCG/ACT inhaler Inhale 2 puffs into the lungs every 6 (six) hours as needed.        . celecoxib (CELEBREX) 200 MG capsule Take 200 mg by mouth daily.        . DULoxetine (CYMBALTA) 60 MG capsule Take 60 mg by mouth daily.        . metoprolol tartrate (LOPRESSOR) 25 MG tablet Take 25 mg by mouth 2 (two) times daily.        Marland Kitchen tiotropium (SPIRIVA) 18 MCG inhalation capsule Place 18 mcg into inhaler and inhale daily.        . traMADol (ULTRAM) 50 MG tablet Take 50 mg by mouth every 6 (six) hours as needed.           Review of Systems: No change  Physical Exam  Cardiovascular: Normal rate, regular rhythm, normal heart sounds and intact distal pulses.   Pulmonary/Chest: Effort normal and breath sounds normal. He has no wheezes.     Diagnostic Tests: Chest x-ray shows resolution of hydropneumothorax on the left   Impression: Status post left wedge resection for adenocarcinoma  Plan: Followup in 2 months with a CT scan

## 2011-01-15 ENCOUNTER — Ambulatory Visit (INDEPENDENT_AMBULATORY_CARE_PROVIDER_SITE_OTHER): Payer: Medicare Other | Admitting: Surgery

## 2011-01-19 ENCOUNTER — Encounter (INDEPENDENT_AMBULATORY_CARE_PROVIDER_SITE_OTHER): Payer: Self-pay | Admitting: Surgery

## 2011-09-01 ENCOUNTER — Emergency Department (HOSPITAL_COMMUNITY): Payer: Medicare Other

## 2011-09-01 ENCOUNTER — Encounter (HOSPITAL_COMMUNITY): Payer: Self-pay | Admitting: Emergency Medicine

## 2011-09-01 ENCOUNTER — Emergency Department (HOSPITAL_COMMUNITY)
Admission: EM | Admit: 2011-09-01 | Discharge: 2011-09-01 | Disposition: A | Discharge status: Home / Self Care | Payer: Medicare Other | Attending: Emergency Medicine | Admitting: Emergency Medicine

## 2011-09-01 DIAGNOSIS — J4489 Other specified chronic obstructive pulmonary disease: Secondary | ICD-10-CM | POA: Insufficient documentation

## 2011-09-01 DIAGNOSIS — R062 Wheezing: Secondary | ICD-10-CM | POA: Insufficient documentation

## 2011-09-01 DIAGNOSIS — R079 Chest pain, unspecified: Secondary | ICD-10-CM | POA: Insufficient documentation

## 2011-09-01 DIAGNOSIS — Z85118 Personal history of other malignant neoplasm of bronchus and lung: Secondary | ICD-10-CM | POA: Insufficient documentation

## 2011-09-01 DIAGNOSIS — J40 Bronchitis, not specified as acute or chronic: Secondary | ICD-10-CM

## 2011-09-01 DIAGNOSIS — R05 Cough: Secondary | ICD-10-CM

## 2011-09-01 DIAGNOSIS — R0602 Shortness of breath: Secondary | ICD-10-CM | POA: Insufficient documentation

## 2011-09-01 DIAGNOSIS — J449 Chronic obstructive pulmonary disease, unspecified: Secondary | ICD-10-CM | POA: Insufficient documentation

## 2011-09-01 DIAGNOSIS — R059 Cough, unspecified: Secondary | ICD-10-CM | POA: Insufficient documentation

## 2011-09-01 LAB — COMPREHENSIVE METABOLIC PANEL
ALT: <5 U/L (ref 0–53)
AST: 10 U/L (ref 0–37)
Calcium: 8.7 mg/dL (ref 8.4–10.5)
Creatinine, Ser: 1.06 mg/dL (ref 0.50–1.35)
GFR calc Af Amer: 82 mL/min — ABNORMAL LOW (ref >90)
GFR calc non Af Amer: 71 mL/min — ABNORMAL LOW (ref >90)
Sodium: 138 mEq/L (ref 135–145)
Total Protein: 7.1 g/dL (ref 6.0–8.3)

## 2011-09-01 LAB — URINALYSIS, ROUTINE W REFLEX MICROSCOPIC
Glucose, UA: NEGATIVE mg/dL
Hgb urine dipstick: NEGATIVE
Leukocytes, UA: NEGATIVE
Protein, ur: NEGATIVE mg/dL
Specific Gravity, Urine: 1.023 (ref 1.005–1.030)
Urobilinogen, UA: 0.2 mg/dL (ref 0.0–1.0)

## 2011-09-01 LAB — CBC
MCV: 82.5 fL (ref 78.0–100.0)
Platelets: 225 10*3/uL (ref 150–400)
RBC: 4.98 MIL/uL (ref 4.22–5.81)
RDW: 16.8 % — ABNORMAL HIGH (ref 11.5–15.5)
WBC: 9.4 10*3/uL (ref 4.0–10.5)

## 2011-09-01 MED ORDER — IPRATROPIUM BROMIDE 0.02 % IN SOLN
RESPIRATORY_TRACT | Status: AC
  Filled 2011-09-01: qty 2.5

## 2011-09-01 MED ORDER — IPRATROPIUM BROMIDE 0.02 % IN SOLN: 0.5000 mg | Freq: Once | RESPIRATORY_TRACT | Status: DC

## 2011-09-01 MED ORDER — IPRATROPIUM BROMIDE 0.02 % IN SOLN
0.5000 mg | Freq: Once | RESPIRATORY_TRACT | Status: AC
  Administered 2011-09-01: 0.5 mg via RESPIRATORY_TRACT
  Filled 2011-09-01: qty 2.5

## 2011-09-01 MED ORDER — DEXTROSE 5 % IV SOLN
500.0000 mg | INTRAVENOUS | Status: DC
  Administered 2011-09-01: 500 mg via INTRAVENOUS
  Filled 2011-09-01: qty 500

## 2011-09-01 MED ORDER — ALBUTEROL SULFATE HFA 108 (90 BASE) MCG/ACT IN AERS: 2.0000 | INHALATION_SPRAY | RESPIRATORY_TRACT | Status: DC | PRN

## 2011-09-01 MED ORDER — ALBUTEROL (5 MG/ML) CONTINUOUS INHALATION SOLN: 10.0000 mg/h | INHALATION_SOLUTION | RESPIRATORY_TRACT | Status: DC

## 2011-09-01 MED ORDER — DEXTROSE 5 % IV SOLN
1.0000 g | INTRAVENOUS | Status: DC
  Administered 2011-09-01: 1 g via INTRAVENOUS
  Filled 2011-09-01: qty 10

## 2011-09-01 MED ORDER — HYDROCOD POLST-CHLORPHEN POLST 10-8 MG/5ML PO LQCR: 5.0000 mL | Freq: Two times a day (BID) | ORAL | Status: DC | PRN

## 2011-09-01 MED ORDER — ALBUTEROL SULFATE (5 MG/ML) 0.5% IN NEBU
5.0000 mg | INHALATION_SOLUTION | Freq: Once | RESPIRATORY_TRACT | Status: AC
  Administered 2011-09-01: 5 mg via RESPIRATORY_TRACT
  Filled 2011-09-01: qty 1

## 2011-09-01 MED ORDER — AZITHROMYCIN 250 MG PO TABS: ORAL_TABLET | ORAL | Status: AC

## 2011-09-01 NOTE — ED Notes (Signed)
Pt given sprite to drink and taking without difficulty.

## 2011-09-01 NOTE — ED Notes (Signed)
[facility] will [facility] to transport pt home.  Pt was was unable to get in contact with daughter and/or any other family members.  Waiting for ptar.

## 2011-09-01 NOTE — ED Notes (Signed)
ZOX:WRUE<AV> Expected date:09/01/11<BR> Expected time: 5:47 AM<BR> Means of arrival:Ambulance<BR> Comments:<BR> Respiratory difficulties

## 2011-09-01 NOTE — ED Notes (Signed)
Pt reports calling EMS because he started coughing and running a fever.  Hx of lung cancer.

## 2011-09-01 NOTE — ED Notes (Addendum)
Per EMS: pt from home, pt c/o ShoB, mild mid chest pain, pt en route placed on 4L per n/c, O2 improved from 95 to 98%, pt reports no appetite for 4-5 days, used inhaler very sparingly, did not use it since 10am. EKG done en route, noted elevation of ST. Secondary to Lung Ca pt had complete left lung removed.

## 2011-09-01 NOTE — ED Notes (Signed)
Son and daughter came to pick up pt.  Cancelled ptar.

## 2011-09-01 NOTE — ED Provider Notes (Addendum)
History     CSN: [id number]  Arrival date & time 09/01/11  4098   First MD Initiated Contact with Patient 09/01/11 0700      Chief Complaint  Patient presents with  . Shortness of Breath  . Chest Pain    (Consider location/radiation/quality/duration/timing/severity/associated sxs/prior treatment) Patient is a 66 y.o. male presenting with shortness of breath and chest pain. The history is provided by the patient.  Shortness of Breath  Associated symptoms include chest pain and shortness of breath. Pertinent negatives include no fever, no rhinorrhea and no sore throat.  Chest Pain Primary symptoms include shortness of breath. Pertinent negatives for primary symptoms include no fever, no palpitations, no abdominal pain and no vomiting.   pt w hx lung ca (pt states resolved after prior partial pneumonectomy, no current rx), copd c/o sob and non productive cough for past few days. States increased wheezing/sob. Says w coughing spells, mid chest pain, dull, worse w coughing. No associated nv or diaphoresis. Denies leg pain or swelling. No dvt or pe hx. No pleuritic pain. Denies hx cad. No exertional cp or discomfort. No orthopnea or pnd. No hx chf. Pt denies change in meds. w cpd, uses albuterol neb prn, had not yet used today, uses home o2 3.5 liter ns continuously. Denies any recent admit for same. Denies recent change in meds.   Past Medical History  Diagnosis Date  . COPD (chronic obstructive pulmonary disease)   . Nodule of left lung   . Mass of lung   . Adenocarcinoma of lung     Past Surgical History  Procedure Date  . Left video-assisted thoracic surgery, resection 08/24/10    Dr Edwyna Shell    No family history on file.  History  Substance Use Topics  . Smoking status: Former Smoker    Types: Cigarettes    Quit date: 08/24/2010  . Smokeless tobacco: Never Used  . Alcohol Use: Not on file      Review of Systems  Constitutional: Negative for fever and chills.  HENT:  Negative for sore throat, rhinorrhea and neck pain.   Eyes: Negative for redness.  Respiratory: Positive for shortness of breath.   Cardiovascular: Positive for chest pain. Negative for palpitations and leg swelling.  Gastrointestinal: Negative for vomiting, abdominal pain and diarrhea.  Genitourinary: Negative for flank pain.  Musculoskeletal: Negative for back pain.  Skin: Negative for rash.  Neurological: Negative for headaches.  Hematological: Does not bruise/bleed easily.  Psychiatric/Behavioral: Negative for confusion.    Allergies  Review of patient's allergies indicates no known allergies.  Home Medications   Current Outpatient Rx  Name Route Sig Dispense Refill  . ALBUTEROL SULFATE HFA 108 (90 BASE) MCG/ACT IN AERS Inhalation Inhale 2 puffs into the lungs every 6 (six) hours as needed.      . ALBUTEROL SULFATE (2.5 MG/3ML) 0.083% IN NEBU Nebulization Take 2.5 mg by nebulization every 6 (six) hours as needed. Shortness of breath    . ALPRAZOLAM 0.5 MG PO TABS Oral Take 0.5 mg by mouth daily.    Marland Kitchen FLUTICASONE PROPIONATE 50 MCG/ACT NA SUSP Nasal Place 2 sprays into the nose daily.    Marland Kitchen FLUTICASONE-SALMETEROL 100-50 MCG/DOSE IN AEPB Inhalation Inhale 1 puff into the lungs every 12 (twelve) hours.    Marland Kitchen GABAPENTIN 600 MG PO TABS Oral Take 600 mg by mouth 3 (three) times daily.    Marland Kitchen METOCLOPRAMIDE HCL 5 MG PO TABS Oral Take 5 mg by mouth 4 (four) times daily.    Marland Kitchen  METOPROLOL TARTRATE 25 MG PO TABS Oral Take 25 mg by mouth 2 (two) times daily.      Marland Kitchen ONDANSETRON HCL 4 MG PO TABS Oral Take 4 mg by mouth 3 (three) times daily.    . OXYCODONE HCL 20 MG PO TABS Oral Take 20 mg by mouth every 4 (four) hours as needed.    . ROFLUMILAST 500 MCG PO TABS Oral Take 500 mcg by mouth daily.    Marland Kitchen SALMETEROL XINAFOATE 50 MCG/DOSE IN AEPB Inhalation Inhale 1 puff into the lungs 2 (two) times daily.    . THEOPHYLLINE ER 200 MG PO TB12 Oral Take 200 mg by mouth 2 (two) times daily.    Marland Kitchen TIOTROPIUM  BROMIDE MONOHYDRATE 18 MCG IN CAPS Inhalation Place 18 mcg into inhaler and inhale daily.      Marland Kitchen TIZANIDINE HCL 4 MG PO TABS Oral Take 4 mg by mouth every 8 (eight) hours.      BP 114/73  Pulse 77  Temp(Src) 99.8 F (37.7 C) (Oral)  Resp 24  SpO2 98%  Physical Exam  Nursing note and vitals reviewed. Constitutional: He is oriented to person, place, and time. He appears well-developed and well-nourished. No distress.  HENT:  Head: Atraumatic.  Mouth/Throat: Oropharynx is clear and moist.  Eyes: Pupils are equal, round, and reactive to light.  Neck: Neck supple. No JVD present. No tracheal deviation present.  Cardiovascular: Normal rate, regular rhythm, normal heart sounds and intact distal pulses.  Exam reveals no gallop and no friction rub.   No murmur heard. Pulmonary/Chest: Effort normal. No accessory muscle usage. No respiratory distress. He has wheezes.  Abdominal: Soft. Bowel sounds are normal. He exhibits no distension and no mass. There is no tenderness. There is no rebound and no guarding.  Musculoskeletal: Normal range of motion. He exhibits no edema and no tenderness.  Neurological: He is alert and oriented to person, place, and time.  Skin: Skin is warm and dry.  Psychiatric: He has a normal mood and affect.    ED Course  Procedures (including critical care time)   Labs Reviewed  COMPREHENSIVE METABOLIC PANEL  TROPONIN I  URINALYSIS, ROUTINE W REFLEX MICROSCOPIC  CBC   Dg Chest 2 View  09/01/2011  *RADIOLOGY REPORT*  Clinical Data: Shortness of breath.  History of lung cancer.  CHEST - 2 VIEW  Comparison: 12/19/2010  Findings: Normal heart size and pulmonary vascularity.  Diffuse pulmonary emphysema with scattered fibrosis in the lungs.  There is a focal scarring in the left mid lung posteriorly associated with an area of postsurgical change.  This could represent postoperative scarring, radiation change, or residual/recurrent mass lesion. There is no significant  change since the previous study.  No focal airspace consolidation in the lungs.  No blunting of costophrenic angles.  No pneumothorax.  Calcified granuloma in the right upper lung.  Scattered tiny nonspecific pulmonary nodules on the right are also stable.  IMPRESSION: No acute process demonstrated.  Again demonstrated and without change is focal scarring and postoperative change in the left mid lung posteriorly, emphysematous changes with scattered fibrosis, scattered calcified granulomas and nonspecific tiny nodules on the right.  Original Report Authenticated By: Marlon Pel, M.D.   Results for orders placed during the hospital encounter of 09/01/11  COMPREHENSIVE METABOLIC PANEL      Component Value Range   Sodium 138  135 - 145 (mEq/L)   Potassium 4.1  3.5 - 5.1 (mEq/L)   Chloride 100  96 -  112 (mEq/L)   CO2 30  19 - 32 (mEq/L)   Glucose, Bld 108 (*) 70 - 99 (mg/dL)   BUN 11  6 - 23 (mg/dL)   Creatinine, Ser 9.14  0.50 - 1.35 (mg/dL)   Calcium 8.7  8.4 - 78.2 (mg/dL)   Total Protein 7.1  6.0 - 8.3 (g/dL)   Albumin 2.9 (*) 3.5 - 5.2 (g/dL)   AST 10  0 - 37 (U/L)   ALT <5  0 - 53 (U/L)   Alkaline Phosphatase 68  39 - 117 (U/L)   Total Bilirubin 0.2 (*) 0.3 - 1.2 (mg/dL)   GFR calc non Af Amer 71 (*) >90 (mL/min)   GFR calc Af Amer 82 (*) >90 (mL/min)  TROPONIN I      Component Value Range   Troponin I <0.30  <0.30 (ng/mL)  URINALYSIS, ROUTINE W REFLEX MICROSCOPIC      Component Value Range   Color, Urine YELLOW  YELLOW    APPearance CLEAR  CLEAR    Specific Gravity, Urine 1.023  1.005 - 1.030    pH 5.5  5.0 - 8.0    Glucose, UA NEGATIVE  NEGATIVE (mg/dL)   Hgb urine dipstick NEGATIVE  NEGATIVE    Bilirubin Urine NEGATIVE  NEGATIVE    Ketones, ur NEGATIVE  NEGATIVE (mg/dL)   Protein, ur NEGATIVE  NEGATIVE (mg/dL)   Urobilinogen, UA 0.2  0.0 - 1.0 (mg/dL)   Nitrite NEGATIVE  NEGATIVE    Leukocytes, UA NEGATIVE  NEGATIVE   CBC      Component Value Range   WBC 9.4  4.0 -  10.5 (K/uL)   RBC 4.98  4.22 - 5.81 (MIL/uL)   Hemoglobin 12.8 (*) 13.0 - 17.0 (g/dL)   HCT 95.6  21.3 - 08.6 (%)   MCV 82.5  78.0 - 100.0 (fL)   MCH 25.7 (*) 26.0 - 34.0 (pg)   MCHC 31.1  30.0 - 36.0 (g/dL)   RDW 57.8 (*) 46.9 - 15.5 (%)   Platelets 225  150 - 400 (K/uL)   Dg Chest 2 View  09/01/2011  *RADIOLOGY REPORT*  Clinical Data: Shortness of breath.  History of lung cancer.  CHEST - 2 VIEW  Comparison: 12/19/2010  Findings: Normal heart size and pulmonary vascularity.  Diffuse pulmonary emphysema with scattered fibrosis in the lungs.  There is a focal scarring in the left mid lung posteriorly associated with an area of postsurgical change.  This could represent postoperative scarring, radiation change, or residual/recurrent mass lesion. There is no significant change since the previous study.  No focal airspace consolidation in the lungs.  No blunting of costophrenic angles.  No pneumothorax.  Calcified granuloma in the right upper lung.  Scattered tiny nonspecific pulmonary nodules on the right are also stable.  IMPRESSION: No acute process demonstrated.  Again demonstrated and without change is focal scarring and postoperative change in the left mid lung posteriorly, emphysematous changes with scattered fibrosis, scattered calcified granulomas and nonspecific tiny nodules on the right.  Original Report Authenticated By: Marlon Pel, M.D.        MDM  Labs. Cxr. Monitor. Pulse ox. Oxygen via [place] applied.   Reviewed nursing notes and prior charts for additional history.     Date: 09/01/2011  Rate: 82  Rhythm: normal sinus rhythm  QRS Axis: normal  Intervals: normal  ST/T Wave abnormalities: nonspecific ST/T changes  Conduction Disutrbances:none  Narrative Interpretation:   Old EKG Reviewed: unchanged   Recheck no increased  wob. Pt breathing comfortably. Occasion coughing episodes. Pulse ox 100% on his normal oxygen.   ecg c/w baseline, trop neg, no cp or discomfort x  associated w coughing spell, pt requests pain med for same.   Given worsening cough, copd, t 99.8 ?fever, will rx bronchitis, possibly early/starting pna. Rocephin and zitrhomax in ed.   Discussed need close pcp f/u.  Informed of tiny lung nodules noted on cxr and need pcp f/u.   Suzi Roots, MD 09/01/11 4098  Suzi Roots, MD 09/01/11 [phone]

## 2011-09-01 NOTE — ED Notes (Signed)
Respiratory notified of orders for nebs.

## 2011-09-01 NOTE — Discharge Instructions (Signed)
Take antibiotic as prescribed. Use albuterol inhaler/nebulizer treatment every 4 hours as need. You may take tussionex as need for cough - may make drowsy, no driving when taking.  Follow up with primary care doctor in the next couple days for recheck.  Return to ER if worse, difficulty breathing, chest pain, high fevers, weak/faint, other concern.  On your chest xray note was made of small lung nodules - follow up with primary care doctor in coming week.      Chronic Obstructive Pulmonary Disease Chronic obstructive pulmonary disease (COPD) is a condition in which airflow from the lungs is restricted. The lungs can never return to normal, but there are measures you can take which will improve them and make you feel better. CAUSES   Smoking.   Exposure to secondhand smoke.   Breathing in irritants (pollution, cigarette smoke, strong smells, aerosol sprays, paint fumes).   History of lung infections.  TREATMENT  Treatment focuses on making you comfortable (supportive care). Your caregiver may prescribe medications (inhaled or pills) to help improve your breathing. HOME CARE INSTRUCTIONS   If you smoke, stop smoking.   Avoid exposure to smoke, chemicals, and fumes that aggravate your breathing.   Take antibiotic medicines as directed by your caregiver.   Avoid medicines that dry up your system and slow down the elimination of secretions (antihistamines and cough syrups). This decreases respiratory capacity and may lead to infections.   Drink enough water and fluids to keep your urine clear or pale yellow. This loosens secretions.   Use humidifiers at home and at your bedside if they do not make breathing difficult.   Receive all protective vaccines your caregiver suggests, especially pneumococcal and influenza.   Use home oxygen as suggested.   Stay active. Exercise and physical activity will help maintain your ability to do things you want to do.   Eat a healthy diet.    SEEK MEDICAL CARE IF:   You develop pus-like mucus (sputum).   Breathing is more labored or exercise becomes difficult to do.   You are running out of the medicine you take for your breathing.  SEEK IMMEDIATE MEDICAL CARE IF:   You have a rapid heart rate.   You have agitation, confusion, tremors, or are in a stupor (family members may need to observe this).   It becomes difficult to breathe.   You develop chest pain.   You have a fever.  MAKE SURE YOU:   Understand these instructions.   Will watch your condition.   Will get help right away if you are not doing well or get worse.  Document Released: 01/03/2005 Document Revised: 03/15/2011 Document Reviewed: 05/26/2010 [facility] Patient Information 2012 [place], [place].       Bronchitis Bronchitis is the body's way of reacting to injury and/or infection (inflammation) of the bronchi. Bronchi are the air tubes that extend from the windpipe into the lungs. If the inflammation becomes severe, it may cause shortness of breath. CAUSES  Inflammation may be caused by:  A virus.   Germs (bacteria).   Dust.   Allergens.   Pollutants and many other irritants.  The cells lining the bronchial tree are covered with tiny hairs (cilia). These constantly beat upward, away from the lungs, toward the mouth. This keeps the lungs free of pollutants. When these cells become too irritated and are unable to do their job, mucus begins to develop. This causes the characteristic cough of bronchitis. The cough clears the lungs when the cilia  are unable to do their job. Without either of these protective mechanisms, the mucus would settle in the lungs. Then you would develop pneumonia. Smoking is a common cause of bronchitis and can contribute to pneumonia. Stopping this habit is the single most important thing you can do to help yourself. TREATMENT   Your caregiver may prescribe an antibiotic if the cough is caused by bacteria. Also,  medicines that open up your airways make it easier to breathe. Your caregiver may also recommend or prescribe an expectorant. It will loosen the mucus to be coughed up. Only take over-the-counter or prescription medicines for pain, discomfort, or fever as directed by your caregiver.   Removing whatever causes the problem (smoking, for example) is critical to preventing the problem from getting worse.   Cough suppressants may be prescribed for relief of cough symptoms.   Inhaled medicines may be prescribed to help with symptoms now and to help prevent problems from returning.   For those with recurrent (chronic) bronchitis, there may be a need for steroid medicines.  SEEK IMMEDIATE MEDICAL CARE IF:   During treatment, you develop more pus-like mucus (purulent sputum).   You have a fever.   Your baby is older than 3 months with a rectal temperature of 102 F (38.9 C) or higher.   Your baby is 62 months old or younger with a rectal temperature of 100.4 F (38 C) or higher.   You become progressively more ill.   You have increased difficulty breathing, wheezing, or shortness of breath.  It is necessary to seek immediate medical care if you are elderly or sick from any other disease. MAKE SURE YOU:   Understand these instructions.   Will watch your condition.   Will get help right away if you are not doing well or get worse.  Document Released: 03/26/2005 Document Revised: 03/15/2011 Document Reviewed: 02/03/2008 [facility] Patient Information 2012 [place], [place].      Pulmonary Nodule A pulmonary (lung) nodule is small, round growth in the lung. The size of a pulmonary nodule can be as small as a pencil eraser (1/5 inch or 4 millmeters) to a little bigger than your biggest toenail (1 inch or 25 millimeters). A pulmonary nodule is usually an unplanned finding. It may be found on a chest X-ray or a computed tomography (CT) scan when you have imaging tests of your lungs done. When a  pulmonary nodule is found, tests will be done to determine if the nodule is benign (not cancerous) or malignant (cancerous). Follow-up treatment or testing is based on the size of the pulmonary nodule and your risk of getting lung cancer.  CAUSES Causes of pulmonary nodules can vary.  Benign pulmonary nodules  can be caused from different things. Some of these things include:  Infection. This can be a common cause of a benign pulmonary nodule. The infection may be active (a current infection) or an old infection that is no longer active. Three types of infections can cause a pulmonary nodule. These are:   Bacterial Infection.   Fungal infection.   Viral Infections.   Hematoma. This is a bruise in the lung. A hematoma can happen from an injury to your chest.   Some common diseases can lead to benign pulmonary nodules. For example, rheumatoid arthritis can be a cause of a pulmonary nodule.   Other unusual things can cause a benign pulmonary nodule. These can include:   Having had tuberculosis.   Rare diseases, such as a lung cyst.  Malignant pulmonary nodules.  These are cancerous growths. The cancer may have:  Started in the lung. Some lung cancers first detected as a pulmonary nodule.   Spread to the lung from cancer somewhere else in the body. This is called metastatic cancer.   Certain risk factors make a cancerous pulmonary nodule more likely. They include:   Age. As people get older, a pulmonary nodule is more likely to be cancerous.   Cancer history. If one of your immediate family members has had cancer, you have a higher risk of developing cancer.   Smoking. This includes people who currently smoke and those who have quit.  DIAGNOSIS To diagnose whether a pulmonary nodule is benign or malignant, a variety of tests will be done. This includes things such as:  Health history. Questions regarding your current health, past health, and family health will be asked.   Blood  tests. Results of blood work can show:   Tumor markers for cancer.   Any type of infection.   A skin test called a tuberculin (TB) test may be done. This test can tell if you have been exposed to the germ that causes tuberculosis.   Imaging tests. These take pictures of your lungs. Types of imaging tests include:   Chest X-ray. This can help in several ways. An X-ray gives a close-up look at the pulmonary nodule. A new X-ray can be compared with any X-rays you have had in the past.    Computed tomography  (CT) scan. This test shows smaller pulmonary nodules more clearly than an X-ray.   Positron emission tomography  (PET) scan. This is a test that uses a radioactive substance to identify a pulmonary nodule. A safe amount of radioactive substance is injected into the blood stream. Then, the scan takes a picture of the pulmonary nodule. A malignant pulmonary nodule will absorb the substance faster than a benign pulmonary nodule. The radioactive substance is eliminated from your body in your urine.   Biopsy.  This removes a tiny piece of the pulmonary nodule so it can be checked under a microscope. Medicine will be given to help keep you relaxed and pain free when a biopsy is done. Types of biopsies include:   Bronchoscopy . This is a surgical procedure. It can be used for pulmonary nodules that are close to the airways in the lung. It uses a scope (a thin tube) with a tiny camera and light on the end. The scope is put in the windpipe. Your caregiver can then see inside the lung. A tiny tool put through the scope is used to take a small sample of the pulmonary nodule tissue.   Transthoracic needle aspiration . This method is used if the pulmonary nodule is far away from the air passages in the lung. A long, thin needle is put through the chest into the lung nodule. A CT scan is done at the same time which can make it easier to locate the pulmonary nodule.   Surgical lung biopsy . This is a  surgical procedure in which the pulmonary nodule is removed. This is usually recommended when the pulmonary nodule is most likely malignant or a biopsy cannot be obtained by either bronchoscopy or transthoracic needle aspiration.  PULMONARY NODULE FOLLOW-UP RECOMMENDATIONS The frequency of pulmonary nodule follow-up is based on your risk factors and size of the pulmonary nodule. If your caregiver suspects the pulmonary nodule is cancerous or the pulmonary nodule changes during any of the follow-up CT scans,  additional testing or biopsies will be done.   If you have no or low risk of getting lung cancer (non-smoker, no personal cancer history), recommended follow-up is based on the following pulmonary nodule size:   A pulmonary nodule that is < 4 mm does not require any follow-up.   A pulmonary nodule that is 4 to 6 mm should be re-imaged by CT scan in 12 months.   A pulmonary nodule that is 6 to 8 mm should be re-imaged by CT scan at 6 to 12 months and then again at 18 to 24 months if no change in size.   A pulmonary nodule > 8 mm in size should be followed closely and re-imaged by CT scan at 3, 9, and 24 months.    If you are at risk of getting lung cancer (current or former smoker, family history of cancer), recommended follow-up is based on the following pulmonary nodule size:   A pulmonary nodule that is < 4 mm in size should be re-imaged by CT scan in 12 months.   A pulmonary nodule that is 4 to 6 mm in size should be re-imaged by CT scan at 6 to 12 months and again at 18 to 24 months.   A pulmonary nodule that is 6 to 8 mm in size should be re-imaged by CT scan at 3, 9, and 24 months.   A pulmonary nodule > 8 mm in size should be followed closely and re-imaged by CT scan at 3, 9, and 24 months.  SEEK MEDICAL CARE IF: While waiting for test results to determine what type of pulmonary nodule you have, be sure to contact your caregiver if you:  Have trouble breathing when you are  active.   Feel sick or unusually tired.   Do not feel like eating.   Lose weight without trying to.   Develop chills or night sweats.   Mild or moderate fevers generally have no long-term effects and often do not require treatment. There are a few exceptions (see below).  SEEK IMMEDIATE MEDICAL CARE IF:  You cannot catch your breath or you begin wheezing.   You cannot stop coughing.   You cough up blood.   You feel like you are going to pass out or become dizzy.   You have sudden chest pain.   You have a fever or persistent symptoms for more than 72 hours.   You have a fever and your symptoms suddenly get worse.  MAKE SURE YOU   Understand these instructions.   Will watch your condition.   Will get help right away if you are not doing well or get worse.  Document Released: 01/21/2009 Document Revised: 03/15/2011 Document Reviewed: 01/21/2009 [facility] Patient Information 2012 [place], [place].

## 2011-09-01 NOTE — ED Notes (Signed)
IV antibiotic still infusing

## 2011-09-01 NOTE — ED Notes (Signed)
Patient transported to X-ray 

## 2012-02-11 DIAGNOSIS — R599 Enlarged lymph nodes, unspecified: Secondary | ICD-10-CM

## 2012-02-11 DIAGNOSIS — Z87891 Personal history of nicotine dependence: Secondary | ICD-10-CM

## 2012-02-11 DIAGNOSIS — C3490 Malignant neoplasm of unspecified part of unspecified bronchus or lung: Secondary | ICD-10-CM

## 2012-02-11 DIAGNOSIS — R634 Abnormal weight loss: Secondary | ICD-10-CM

## 2012-02-21 ENCOUNTER — Inpatient Hospital Stay (HOSPITAL_COMMUNITY)
Admission: EM | Admit: 2012-02-21 | Discharge: 2012-02-28 | DRG: 193 | Disposition: A | Discharge status: Home / Self Care | Payer: Medicare Other | Attending: Internal Medicine | Admitting: Internal Medicine

## 2012-02-21 ENCOUNTER — Emergency Department (HOSPITAL_COMMUNITY): Payer: Medicare Other

## 2012-02-21 ENCOUNTER — Encounter (HOSPITAL_COMMUNITY): Payer: Self-pay | Admitting: Emergency Medicine

## 2012-02-21 DIAGNOSIS — C3430 Malignant neoplasm of lower lobe, unspecified bronchus or lung: Secondary | ICD-10-CM | POA: Diagnosis present

## 2012-02-21 DIAGNOSIS — D630 Anemia in neoplastic disease: Secondary | ICD-10-CM | POA: Diagnosis present

## 2012-02-21 DIAGNOSIS — J189 Pneumonia, unspecified organism: Principal | ICD-10-CM | POA: Diagnosis present

## 2012-02-21 DIAGNOSIS — J9600 Acute respiratory failure, unspecified whether with hypoxia or hypercapnia: Secondary | ICD-10-CM | POA: Diagnosis present

## 2012-02-21 DIAGNOSIS — R000 Tachycardia, unspecified: Secondary | ICD-10-CM | POA: Diagnosis present

## 2012-02-21 DIAGNOSIS — R0602 Shortness of breath: Secondary | ICD-10-CM

## 2012-02-21 DIAGNOSIS — C3490 Malignant neoplasm of unspecified part of unspecified bronchus or lung: Secondary | ICD-10-CM

## 2012-02-21 DIAGNOSIS — J4489 Other specified chronic obstructive pulmonary disease: Secondary | ICD-10-CM | POA: Diagnosis present

## 2012-02-21 DIAGNOSIS — J449 Chronic obstructive pulmonary disease, unspecified: Secondary | ICD-10-CM | POA: Diagnosis present

## 2012-02-21 DIAGNOSIS — R911 Solitary pulmonary nodule: Secondary | ICD-10-CM

## 2012-02-21 DIAGNOSIS — D72829 Elevated white blood cell count, unspecified: Secondary | ICD-10-CM | POA: Diagnosis present

## 2012-02-21 HISTORY — DX: Heart failure, unspecified: I50.9

## 2012-02-21 HISTORY — DX: Other specified postprocedural states: Z98.890

## 2012-02-21 HISTORY — DX: Other specified postprocedural states: R11.2

## 2012-02-21 LAB — CBC WITH DIFFERENTIAL/PLATELET
Basophils Absolute: 0 10*3/uL (ref 0.0–0.1)
Basophils Relative: 0 % (ref 0–1)
HCT: 40.7 % (ref 39.0–52.0)
Lymphocytes Relative: 4 % — ABNORMAL LOW (ref 12–46)
MCHC: 33.4 g/dL (ref 30.0–36.0)
Monocytes Absolute: 0.9 10*3/uL (ref 0.1–1.0)
Neutro Abs: 9.8 10*3/uL — ABNORMAL HIGH (ref 1.7–7.7)
Platelets: 183 10*3/uL (ref 150–400)
RDW: 15.4 % (ref 11.5–15.5)
WBC: 11.2 10*3/uL — ABNORMAL HIGH (ref 4.0–10.5)

## 2012-02-21 LAB — MAGNESIUM: Magnesium: 2.5 mg/dL (ref 1.5–2.5)

## 2012-02-21 LAB — EXPECTORATED SPUTUM ASSESSMENT W GRAM STAIN, RFLX TO RESP C

## 2012-02-21 LAB — URINE MICROSCOPIC-ADD ON

## 2012-02-21 LAB — COMPREHENSIVE METABOLIC PANEL
ALT: 11 U/L (ref 0–53)
AST: 23 U/L (ref 0–37)
Albumin: 2.9 g/dL — ABNORMAL LOW (ref 3.5–5.2)
Calcium: 8.9 mg/dL (ref 8.4–10.5)
Chloride: 99 mEq/L (ref 96–112)
Creatinine, Ser: 0.8 mg/dL (ref 0.50–1.35)
Sodium: 136 mEq/L (ref 135–145)

## 2012-02-21 LAB — URINALYSIS, ROUTINE W REFLEX MICROSCOPIC
Glucose, UA: NEGATIVE mg/dL
Hgb urine dipstick: NEGATIVE
Specific Gravity, Urine: 1.024 (ref 1.005–1.030)

## 2012-02-21 LAB — STREP PNEUMONIAE URINARY ANTIGEN: Strep Pneumo Urinary Antigen: POSITIVE — AB

## 2012-02-21 MED ORDER — ENOXAPARIN SODIUM 30 MG/0.3ML ~~LOC~~ SOLN
30.0000 mg | SUBCUTANEOUS | Status: DC
  Administered 2012-02-22 – 2012-02-24 (×3): 30 mg via SUBCUTANEOUS
  Filled 2012-02-21 (×5): qty 0.3

## 2012-02-21 MED ORDER — METOCLOPRAMIDE HCL 5 MG PO TABS
5.0000 mg | ORAL_TABLET | Freq: Four times a day (QID) | ORAL | Status: DC
  Administered 2012-02-21 – 2012-02-28 (×27): 5 mg via ORAL
  Filled 2012-02-21 (×29): qty 1

## 2012-02-21 MED ORDER — ONDANSETRON HCL 4 MG PO TABS
4.0000 mg | ORAL_TABLET | Freq: Three times a day (TID) | ORAL | Status: DC
  Administered 2012-02-21 – 2012-02-28 (×21): 4 mg via ORAL
  Filled 2012-02-21 (×23): qty 1

## 2012-02-21 MED ORDER — IPRATROPIUM BROMIDE 0.02 % IN SOLN
0.5000 mg | Freq: Once | RESPIRATORY_TRACT | Status: AC
  Administered 2012-02-21: 0.5 mg via RESPIRATORY_TRACT
  Filled 2012-02-21: qty 2.5

## 2012-02-21 MED ORDER — DEXTROSE 5 % IV SOLN
1.0000 g | Freq: Once | INTRAVENOUS | Status: AC
  Administered 2012-02-21: 1 g via INTRAVENOUS
  Filled 2012-02-21: qty 10

## 2012-02-21 MED ORDER — ROFLUMILAST 500 MCG PO TABS
500.0000 ug | ORAL_TABLET | Freq: Every day | ORAL | Status: DC
  Administered 2012-02-21 – 2012-02-28 (×8): 500 ug via ORAL
  Filled 2012-02-21 (×8): qty 1

## 2012-02-21 MED ORDER — GABAPENTIN 600 MG PO TABS
600.0000 mg | ORAL_TABLET | Freq: Three times a day (TID) | ORAL | Status: DC
  Administered 2012-02-21: 600 mg via ORAL
  Filled 2012-02-21 (×4): qty 1

## 2012-02-21 MED ORDER — OXYCODONE HCL 5 MG PO TABS
20.0000 mg | ORAL_TABLET | ORAL | Status: DC | PRN
  Administered 2012-02-21 – 2012-02-26 (×7): 20 mg via ORAL
  Filled 2012-02-21 (×7): qty 4

## 2012-02-21 MED ORDER — ALBUTEROL SULFATE HFA 108 (90 BASE) MCG/ACT IN AERS: 2.0000 | INHALATION_SPRAY | RESPIRATORY_TRACT | Status: DC | PRN

## 2012-02-21 MED ORDER — HYDROCOD POLST-CHLORPHEN POLST 10-8 MG/5ML PO LQCR
5.0000 mL | Freq: Two times a day (BID) | ORAL | Status: DC | PRN
  Administered 2012-02-22 – 2012-02-23 (×4): 5 mL via ORAL
  Filled 2012-02-21 (×4): qty 5

## 2012-02-21 MED ORDER — TIOTROPIUM BROMIDE MONOHYDRATE 18 MCG IN CAPS
18.0000 ug | ORAL_CAPSULE | Freq: Every day | RESPIRATORY_TRACT | Status: DC
  Administered 2012-02-21 – 2012-02-28 (×8): 18 ug via RESPIRATORY_TRACT
  Filled 2012-02-21: qty 5

## 2012-02-21 MED ORDER — ALBUTEROL SULFATE (5 MG/ML) 0.5% IN NEBU: 2.5000 mg | INHALATION_SOLUTION | RESPIRATORY_TRACT | Status: DC | PRN

## 2012-02-21 MED ORDER — ALPRAZOLAM 0.5 MG PO TABS
0.5000 mg | ORAL_TABLET | Freq: Every day | ORAL | Status: DC
  Administered 2012-02-22 – 2012-02-28 (×7): 0.5 mg via ORAL
  Filled 2012-02-21 (×7): qty 1

## 2012-02-21 MED ORDER — FLUTICASONE PROPIONATE 50 MCG/ACT NA SUSP
2.0000 | Freq: Every day | NASAL | Status: DC
  Administered 2012-02-21 – 2012-02-28 (×8): 2 via NASAL

## 2012-02-21 MED ORDER — FLUTICASONE PROPIONATE 50 MCG/ACT NA SUSP
2.0000 | Freq: Every day | NASAL | Status: DC
  Filled 2012-02-21: qty 16

## 2012-02-21 MED ORDER — MOMETASONE FURO-FORMOTEROL FUM 100-5 MCG/ACT IN AERO
2.0000 | INHALATION_SPRAY | Freq: Two times a day (BID) | RESPIRATORY_TRACT | Status: DC
  Administered 2012-02-22 – 2012-02-28 (×13): 2 via RESPIRATORY_TRACT
  Filled 2012-02-21: qty 8.8

## 2012-02-21 MED ORDER — TIZANIDINE HCL 4 MG PO TABS
4.0000 mg | ORAL_TABLET | Freq: Three times a day (TID) | ORAL | Status: DC
  Administered 2012-02-21 – 2012-02-28 (×21): 4 mg via ORAL
  Filled 2012-02-21 (×26): qty 1

## 2012-02-21 MED ORDER — DEXTROSE 5 % IV SOLN
1.0000 g | INTRAVENOUS | Status: DC
  Administered 2012-02-22 – 2012-02-25 (×4): 1 g via INTRAVENOUS
  Filled 2012-02-21 (×6): qty 10

## 2012-02-21 MED ORDER — ALBUTEROL SULFATE (5 MG/ML) 0.5% IN NEBU
5.0000 mg | INHALATION_SOLUTION | Freq: Once | RESPIRATORY_TRACT | Status: AC
  Administered 2012-02-21: 5 mg via RESPIRATORY_TRACT
  Filled 2012-02-21: qty 1

## 2012-02-21 MED ORDER — SALMETEROL XINAFOATE 50 MCG/DOSE IN AEPB
1.0000 | INHALATION_SPRAY | Freq: Two times a day (BID) | RESPIRATORY_TRACT | Status: DC
  Filled 2012-02-21: qty 0

## 2012-02-21 MED ORDER — DEXTROSE 5 % IV SOLN
500.0000 mg | Freq: Once | INTRAVENOUS | Status: AC
  Administered 2012-02-21: 500 mg via INTRAVENOUS
  Filled 2012-02-21: qty 500

## 2012-02-21 MED ORDER — METOPROLOL TARTRATE 25 MG PO TABS
25.0000 mg | ORAL_TABLET | Freq: Two times a day (BID) | ORAL | Status: DC
  Administered 2012-02-21 – 2012-02-28 (×14): 25 mg via ORAL
  Filled 2012-02-21 (×15): qty 1

## 2012-02-21 MED ORDER — SODIUM CHLORIDE 0.9 % IV SOLN
INTRAVENOUS | Status: AC
  Administered 2012-02-21: 20:00:00 via INTRAVENOUS

## 2012-02-21 MED ORDER — DEXTROSE 5 % IV SOLN
500.0000 mg | INTRAVENOUS | Status: DC
  Administered 2012-02-22 – 2012-02-25 (×4): 500 mg via INTRAVENOUS
  Filled 2012-02-21 (×6): qty 500

## 2012-02-21 MED ORDER — THEOPHYLLINE ER 200 MG PO TB12
200.0000 mg | ORAL_TABLET | Freq: Two times a day (BID) | ORAL | Status: DC
  Administered 2012-02-21 – 2012-02-28 (×14): 200 mg via ORAL
  Filled 2012-02-21 (×15): qty 1

## 2012-02-21 MED ORDER — ACETAMINOPHEN 325 MG PO TABS
650.0000 mg | ORAL_TABLET | Freq: Once | ORAL | Status: AC
  Administered 2012-02-21: 650 mg via ORAL
  Filled 2012-02-21: qty 2

## 2012-02-21 MED ORDER — SODIUM CHLORIDE 0.9 % IV BOLUS (SEPSIS)
500.0000 mL | Freq: Once | INTRAVENOUS | Status: AC
  Administered 2012-02-21: 500 mL via INTRAVENOUS

## 2012-02-21 NOTE — ED Notes (Signed)
Report given to [facility], RN-transfer to 1430

## 2012-02-21 NOTE — ED Notes (Signed)
Per EMS/Patient, has had cold symptoms for 4 days, symptoms increased this am-no relief from neb treatments

## 2012-02-21 NOTE — ED Notes (Signed)
ZOX:WRUE<AV> Expected date:02/21/12<BR> Expected time: 1:20 PM<BR> Means of arrival:Ambulance<BR> Comments:<BR> Elderly/resp. distress

## 2012-02-21 NOTE — ED Notes (Signed)
Patient still unable to provide urine sample. Urinal at bedside.

## 2012-02-21 NOTE — ED Notes (Signed)
Pt sts he is getting nauseous.

## 2012-02-21 NOTE — ED Provider Notes (Addendum)
History     CSN: [id number]  Arrival date & time 02/21/12  1335   First MD Initiated Contact with Patient 02/21/12 1345      Chief Complaint  Patient presents with  . Shortness of Breath    (Consider location/radiation/quality/duration/timing/severity/associated sxs/prior treatment) Patient is a 66 y.o. male presenting with shortness of breath. The history is provided by the patient.  Shortness of Breath  The current episode started 3 to 5 days ago. The onset was gradual. The problem occurs continuously. The problem has been gradually worsening. The problem is severe. Relieved by: Albuterol is not helping. The symptoms are aggravated by activity. Associated symptoms include a fever, cough, shortness of breath and wheezing. The cough is productive (Green sputum). Nothing relieves the cough. He has had intermittent steroid use. He has had prior hospitalizations. Past medical history comments: Past history of lung cancer status post resection. Urine output has been normal. There were no sick contacts. Recently, medical care has been given by EMS.    Past Medical History  Diagnosis Date  . COPD (chronic obstructive pulmonary disease)   . Nodule of left lung   . Mass of lung   . Adenocarcinoma of lung   . CHF (congestive heart failure)     Past Surgical History  Procedure Date  . Left video-assisted thoracic surgery, resection 08/24/10    Dr Edwyna Shell    No family history on file.  History  Substance Use Topics  . Smoking status: Former Smoker    Types: Cigarettes    Quit date: 08/24/2010  . Smokeless tobacco: Never Used  . Alcohol Use: Not on file      Review of Systems  Constitutional: Positive for fever.  Respiratory: Positive for cough, shortness of breath and wheezing.   All other systems reviewed and are negative.    Allergies  Review of patient's allergies indicates no known allergies.  Home Medications   Current Outpatient Rx  Name  Route  Sig  Dispense   Refill  . ALBUTEROL SULFATE HFA 108 (90 BASE) MCG/ACT IN AERS   Inhalation   Inhale 2 puffs into the lungs every 6 (six) hours as needed.           . ALBUTEROL SULFATE HFA 108 (90 BASE) MCG/ACT IN AERS   Inhalation   Inhale 2 puffs into the lungs every 4 (four) hours as needed for wheezing.   1 Inhaler   3   . ALBUTEROL SULFATE (2.5 MG/3ML) 0.083% IN NEBU   Nebulization   Take 2.5 mg by nebulization every 6 (six) hours as needed. Shortness of breath         . ALPRAZOLAM 0.5 MG PO TABS   Oral   Take 0.5 mg by mouth daily.         Marland Kitchen HYDROCOD POLST-CPM POLST ER 10-8 MG/5ML PO LQCR   Oral   Take 5 mLs by mouth every 12 (twelve) hours as needed.   140 mL   0   . FLUTICASONE PROPIONATE 50 MCG/ACT NA SUSP   Nasal   Place 2 sprays into the nose daily.         Marland Kitchen FLUTICASONE-SALMETEROL 100-50 MCG/DOSE IN AEPB   Inhalation   Inhale 1 puff into the lungs every 12 (twelve) hours.         Marland Kitchen GABAPENTIN 600 MG PO TABS   Oral   Take 600 mg by mouth 3 (three) times daily.         Marland Kitchen  METOCLOPRAMIDE HCL 5 MG PO TABS   Oral   Take 5 mg by mouth 4 (four) times daily.         Marland Kitchen METOPROLOL TARTRATE 25 MG PO TABS   Oral   Take 25 mg by mouth 2 (two) times daily.           Marland Kitchen ONDANSETRON HCL 4 MG PO TABS   Oral   Take 4 mg by mouth 3 (three) times daily.         . OXYCODONE HCL 20 MG PO TABS   Oral   Take 20 mg by mouth every 4 (four) hours as needed.         . ROFLUMILAST 500 MCG PO TABS   Oral   Take 500 mcg by mouth daily.         Marland Kitchen SALMETEROL XINAFOATE 50 MCG/DOSE IN AEPB   Inhalation   Inhale 1 puff into the lungs 2 (two) times daily.         . THEOPHYLLINE ER 200 MG PO TB12   Oral   Take 200 mg by mouth 2 (two) times daily.         Marland Kitchen TIOTROPIUM BROMIDE MONOHYDRATE 18 MCG IN CAPS   Inhalation   Place 18 mcg into inhaler and inhale daily.           Marland Kitchen TIZANIDINE HCL 4 MG PO TABS   Oral   Take 4 mg by mouth every 8 (eight) hours.            BP 153/104  Pulse 117  Temp 99.4 F (37.4 C) (Oral)  Resp 21  SpO2 96%  Physical Exam  Nursing note and vitals reviewed. Constitutional: He is oriented to person, place, and time. He appears well-developed and well-nourished. No distress.  HENT:  Head: Normocephalic and atraumatic.  Mouth/Throat: Oropharynx is clear and moist.  Eyes: Conjunctivae normal and EOM are normal. Pupils are equal, round, and reactive to light.  Neck: Normal range of motion. Neck supple.  Cardiovascular: Regular rhythm and intact distal pulses.  Tachycardia present.   No murmur heard. Pulmonary/Chest: Tachypnea noted. No respiratory distress. He has decreased breath sounds. He has no wheezes. He has rhonchi in the left middle field and the left lower field. He has no rales.  Abdominal: Soft. He exhibits no distension. There is no tenderness. There is no rebound and no guarding.  Musculoskeletal: Normal range of motion. He exhibits no edema and no tenderness.  Neurological: He is alert and oriented to person, place, and time.  Skin: Skin is warm and dry. No rash noted. No erythema.  Psychiatric: He has a normal mood and affect. His behavior is normal.    ED Course  Procedures (including critical care time)  Labs Reviewed  CBC WITH DIFFERENTIAL - Abnormal; Notable for the following:    WBC 11.2 (*)     Neutrophils Relative 88 (*)     Neutro Abs 9.8 (*)     Lymphocytes Relative 4 (*)     Lymphs Abs 0.5 (*)     All other components within normal limits  COMPREHENSIVE METABOLIC PANEL - Abnormal; Notable for the following:    Glucose, Bld 131 (*)     Albumin 2.9 (*)     All other components within normal limits  URINALYSIS, ROUTINE W REFLEX MICROSCOPIC   Dg Chest 2 View  02/21/2012  *RADIOLOGY REPORT*  Clinical Data: SOB; cough x 4 days; hx of lung CA, part of  lung removed; smoker; HTN;  CHEST - 2 VIEW  Comparison: 09/01/2011  Findings: There is irregular scarring and nodularity in the vicinity of  the superior segment left lower lobe.  This is in the vicinity or of the prior wedge resection and tumor.  Patchy opacity and interstitial accentuation noted in the left lower lobe, with mild lingular scarring.  Underlying emphysema noted.  Mild nodularity noted projecting over the right lung base, including a 7 mm nodule which is projecting adjacent to the right tenth rib.  Cardiac and mediastinal contours appear unremarkable.  IMPRESSION:  1.  Nodular density posteriorly in the superior segment left lower lobe in the vicinity of the wedge resection, with adjacent radiating linear opacities.  This probably represents postoperative scarring and is relatively similar to the prior exam.  Certainly residual tumor in this vicinity cannot be excluded. 2.  Indistinct patchy primarily interstitial opacities in the left lower lobe, query early bronchopneumonia. Follow-up to ensure clearance recommended. 3.  Scattered small nodules in the right lung, similar to prior. These would likely be better followed by periodic CT scan in this patient with history lung cancer with regard to stability and growth.   Original Report Authenticated By: Gaylyn Rong, M.D.      Date: 02/21/2012  Rate: 119  Rhythm: sinus tachycardia  QRS Axis: normal  Intervals: normal  ST/T Wave abnormalities: normal  Conduction Disutrbances:none  Narrative Interpretation:   Old EKG Reviewed: unchanged   1. Community acquired pneumonia       MDM   Patient with shortness of breath, productive cough and fever for the last 4 days. He has a history of lung cancer status post resection. He uses albuterol regularly but states in the last few days it is not helping with shortness of breath. Patient is tachycardic here and is febrile. He was satting 94% on 2 L. He does not normally wear oxygen at home. CBC, CMP, UA pending. Patient given albuterol and Atrovent a chest x-ray pending. Concern for infection versus COPD exacerbation  3:18  PM Patient with evidence of new pneumonia. He was treated with azithro and rocephin for CAP and will admit for further care.       Gwyneth Sprout, MD 02/21/12 1524  Gwyneth Sprout, MD 02/21/12 1536

## 2012-02-21 NOTE — H&P (Signed)
[facility] History and Physical  Stephen Fitzpatrick ZOX:[id number] DOB: 04/12/1945 DOA: 02/21/2012  Referring physician: ED physician PCP: Rudi Heap, MD   Chief Complaint: Shortness of breath  HPI:  Pt is 66 yo male who presents with main concern of progressively worsening shortness of breath, that started 4-5 days prior to admission and has been associated with green sputum production, fevers, chills, exertional dyspnea, poor oral intake. Pt denies recent similar episode but endorses history of lung cancer. Pt denies chest pain, abdominal or urinary concerns, no recent sicknesses or hospitalizations, no recent travelling. In ED, CXR indicated PNA and follow up imaging recommended to ensure resolution of PNA.   Assessment and Plan:  Principal Problem:  *Shortness of breath - this is most likely secondary to community acquired PNA - PNA order set used on admission and pt started on Zithromax and Rocephin - gram stain and culture of the sputum also ordered as well urine legionella and strep   - continue providing supportive care, analgesia as needed, oxygen as needed, incentive spirometry when awake - nebulizer scheduled and as needed Active Problems:  PNA (pneumonia) - community acquired - continue ABX as noted above  Leukocytosis - secondary to PNA - antibiotics started - CBC in AM  Adenocarcinoma of Left Lower Lobe lung - in the past and apparently in remission   Radiological Exams on Admission: Dg Chest 2 View 02/21/2012    IMPRESSION:   1.  Nodular density posteriorly in the superior segment left lower lobe in the vicinity of the wedge resection, with adjacent radiating linear opacities.  This probably represents postoperative scarring and is relatively similar to the prior exam.  Certainly residual tumor in this vicinity cannot be excluded.  2.  Indistinct patchy primarily interstitial opacities in the left lower lobe, query early bronchopneumonia. Follow-up to  ensure clearance recommended.  3.  Scattered small nodules in the right lung, similar to prior. These would likely be better followed by periodic CT scan in this patient with history lung cancer with regard to stability and growth.    Code Status: Full Family Communication: Pt at bedside Disposition Plan: Admit to telemetry unit   Review of Systems:  Constitutional: Positive for fever, chills and malaise/fatigue. Negative for diaphoresis.  HENT: Negative for hearing loss, ear pain, nosebleeds, congestion, sore throat, neck pain, tinnitus and ear discharge.   Eyes: Negative for blurred vision, double vision, photophobia, pain, discharge and redness.  Respiratory: Positive for cough, sputum production, shortness of breath, negative for wheezing and stridor.   Cardiovascular: Negative for chest pain, palpitations, orthopnea, claudication and leg swelling.  Gastrointestinal: Negative for nausea, vomiting and abdominal pain. Negative for heartburn, constipation, blood in stool and melena.  Genitourinary: Negative for dysuria, urgency, frequency, hematuria and flank pain.  Musculoskeletal: Negative for myalgias, back pain, joint pain and falls.  Skin: Negative for itching and rash.  Neurological: Negative for dizziness and weakness. Negative for tingling, tremors, sensory change, speech change, focal weakness, loss of consciousness and headaches.  Endo/Heme/Allergies: Negative for environmental allergies and polydipsia. Does not bruise/bleed easily.  Psychiatric/Behavioral: Negative for suicidal ideas. The patient is not nervous/anxious.      Past Medical History  Diagnosis Date  . COPD (chronic obstructive pulmonary disease)   . Nodule of left lung   . Mass of lung   . Adenocarcinoma of lung   . CHF (congestive heart failure)   . PONV (postoperative nausea and vomiting)     Past Surgical History  Procedure Date  .  Left video-assisted thoracic surgery, resection 08/24/10    Dr Edwyna Shell     Social History:  reports that he quit smoking about 17 months ago. His smoking use included Cigarettes. He has never used smokeless tobacco. He reports that he does not drink alcohol or use illicit drugs.  No Known Allergies  No family history of cancers or heart disease.   Medication Sig  albuterol (PROVENTIL HFA;VENTOLIN HFA) 108 (90 BASE) MCG/ACT inhaler Inhale 2 puffs into the lungs every 4 (four) hours as needed for wheezing.  albuterol (PROVENTIL) (2.5 MG/3ML) 0.083% Take 2.5 mg every 6 (six) hours as needed.   ALPRAZolam (XANAX) 0.5 MG tablet Take 0.5 mg by mouth daily.  chlorpheniramine-HYDROcodone  Take 5 mLs by mouth every 12 hours as needed.  fluticasone (FLONASE) 50 MCG/ACT nasal spray Place 2 sprays into the nose daily.  Fluticasone-Salmeterol (ADVAIR)  Inhale 1 puff into the lungs every 12 (twelve) hours.  gabapentin (NEURONTIN) 600 MG tablet Take 600 mg by mouth 3 (three) times daily.  metoCLOPramide (REGLAN) 5 MG tablet Take 5 mg by mouth 4 (four) times daily.  metoprolol tartrate (LOPRESSOR) 25 MG tablet Take 25 mg by mouth 2 (two) times daily.    ondansetron (ZOFRAN) 4 MG tablet Take 4 mg by mouth 3 (three) times daily.  Oxycodone HCl 20 MG TABS Take 20 mg by mouth every 4 hours as needed.  roflumilast (DALIRESP) 500 MCG TABS tablet Take 500 mcg by mouth daily.  salmeterol 50 MCG/DOSE diskus inhaler Inhale 1 puff into the lungs 2 (two) times daily.  theophylline (THEODUR) 200 MG 12 hr tablet Take 200 mg by mouth 2 (two) times daily.  tiotropium (SPIRIVA) 18 MCG inhalation capsule Place 18 mcg into inhaler and inhale daily.    tiZANidine (ZANAFLEX) 4 MG tablet Take 4 mg by mouth every 8 (eight) hours.    Physical Exam: Filed Vitals:   02/21/12 1343 02/21/12 1500  BP: 153/104   Pulse: 117   Temp: 99.4 F (37.4 C) 101.1 F (38.4 C)  TempSrc: Oral Rectal  Resp: 21   SpO2: 96%     Physical Exam  Constitutional: Appears well-developed and well-nourished. No  distress. Febrile HENT: Normocephalic. External right and left ear normal. Oropharynx is clear and moist.  Eyes: Conjunctivae and EOM are normal. PERRLA, no scleral icterus.  Neck: Normal ROM. Neck supple. No JVD. No tracheal deviation. No thyromegaly.  CVS: Regular rhythm, tachycardic, S1/S2 +, no murmurs, no gallops, no carotid bruit.  Pulmonary: Effort and breath sounds normal, scattered rales L>R. No wheezing  Abdominal: Soft. BS +,  no distension, tenderness, rebound or guarding.  Musculoskeletal: Normal range of motion. No edema and no tenderness.  Lymphadenopathy: No lymphadenopathy noted, cervical, inguinal. Neuro: Alert. Normal reflexes, muscle tone coordination. No cranial nerve deficit. Skin: Skin is warm and dry. No rash noted. Not diaphoretic. No erythema. No pallor.  Psychiatric: Normal mood and affect. Behavior, judgment, thought content normal.   Labs on Admission:  Basic Metabolic Panel:  Lab 02/21/12 1610  NA 136  K 4.2  CL 99  CO2 28  GLUCOSE 131*  BUN 12  CREATININE 0.80  CALCIUM 8.9  MG --  PHOS --   Liver Function Tests:  Lab 02/21/12 1415  AST 23  ALT 11  ALKPHOS 113  BILITOT 0.4  PROT 6.8  ALBUMIN 2.9*   CBC:  Lab 02/21/12 1415  WBC 11.2*  NEUTROABS 9.8*  HGB 13.6  HCT 40.7  MCV 80.3  PLT 183  EKG: Normal sinus rhythm, no ST/T wave changes  Debbora Presto, MD  [facility] Pager [phone]  If 7PM-7AM, please contact night-coverage [email or web address] Password [email or web address] 02/21/2012, 4:10 PM

## 2012-02-22 LAB — CBC
Hemoglobin: 11.9 g/dL — ABNORMAL LOW (ref 13.0–17.0)
RBC: 4.58 MIL/uL (ref 4.22–5.81)

## 2012-02-22 LAB — BASIC METABOLIC PANEL
GFR calc Af Amer: >90 mL/min (ref >90)
GFR calc non Af Amer: >90 mL/min (ref >90)
Potassium: 3.4 mEq/L — ABNORMAL LOW (ref 3.5–5.1)
Sodium: 133 mEq/L — ABNORMAL LOW (ref 135–145)

## 2012-02-22 LAB — LEGIONELLA ANTIGEN, URINE

## 2012-02-22 LAB — EXPECTORATED SPUTUM ASSESSMENT W GRAM STAIN, RFLX TO RESP C

## 2012-02-22 MED ORDER — POTASSIUM CHLORIDE CRYS ER 20 MEQ PO TBCR
40.0000 meq | EXTENDED_RELEASE_TABLET | Freq: Once | ORAL | Status: AC
  Administered 2012-02-22: 40 meq via ORAL
  Filled 2012-02-22: qty 2

## 2012-02-22 MED ORDER — GABAPENTIN 300 MG PO CAPS
600.0000 mg | ORAL_CAPSULE | Freq: Three times a day (TID) | ORAL | Status: DC
  Administered 2012-02-22 – 2012-02-28 (×19): 600 mg via ORAL
  Filled 2012-02-22 (×21): qty 2

## 2012-02-22 NOTE — Progress Notes (Signed)
Patient ID: Stephen Fitzpatrick, male   DOB: 1945-11-28, 66 y.o.   MRN: [id number]  [facility] PROGRESS NOTE  Stephen Fitzpatrick:[id number] DOB: Jun 13, 1945 DOA: 02/21/2012 PCP: Rudi Heap, MD  Brief narrative:  Pt is 66 yo male who presents with main concern of progressively worsening shortness of breath, that started 4-5 days prior to admission and has been associated with green sputum production, fevers, chills, exertional dyspnea, poor oral intake. Pt denies recent similar episode but endorses history of lung cancer. Pt denies chest pain, abdominal or urinary concerns, no recent sicknesses or hospitalizations, no recent travelling. In ED, CXR indicated PNA and follow up imaging recommended to ensure resolution of PNA.   Assessment and Plan:  Principal Problem:  *Shortness of breath  - this is most likely secondary to community acquired PNA - PNA order set used on admission and pt started on Zithromax and Rocephin  - gram stain and culture of the sputum also ordered as well urine legionella and strep, still pending to date - continue providing supportive care, analgesia as needed, oxygen as needed, incentive spirometry when awake  - nebulizer scheduled and as needed  Active Problems:  PNA (pneumonia)  - community acquired  - continue ABX as noted above, Zithromax and Rocephin Leukocytosis  - secondary to PNA - antibiotics started  - this is now resolved  - CBC in AM  Adenocarcinoma of Left Lower Lobe lung  - in the past and apparently in remission   Radiological Exams on Admission:  Dg Chest 2 View  02/21/2012  IMPRESSION:  1. Nodular density posteriorly in the superior segment left lower lobe in the vicinity of the wedge resection, with adjacent radiating linear opacities. This probably represents postoperative scarring and is relatively similar to the prior exam. Certainly residual tumor in this vicinity cannot be excluded.  2. Indistinct patchy primarily interstitial  opacities in the left lower lobe, query early bronchopneumonia. Follow-up to ensure clearance recommended.  3. Scattered small nodules in the right lung, similar to prior. These would likely be better followed by periodic CT scan in this patient with history lung cancer with regard to stability and growth.   Antibiotics  Zithromax 11/14 -->  Rocephin 11/14 -->  Consultants:  None  Code Status: Full  Family Communication: Pt at bedside  Disposition Plan: Admit to telemetry unit   HPI/Subjective: No events overnight.   Objective: Filed Vitals:   02/22/12 0425 02/22/12 0500 02/22/12 0837 02/22/12 0939  BP: 136/79     Pulse: 90     Temp: 98.6 F (37 C)     TempSrc: Oral     Resp: 20     Height:      Weight:  56.6 kg (124 lb 12.5 oz)    SpO2: 96%  95% 96%    Intake/Output Summary (Last 24 hours) at 02/22/12 1331 Last data filed at 02/22/12 0708  Gross per 24 hour  Intake 1308.33 ml  Output    650 ml  Net 658.33 ml    Exam:   General:  Pt is alert, follows commands appropriately, not in acute distress  Cardiovascular: Regular rate and rhythm, S1/S2, no murmurs, no rubs, no gallops  Respiratory: Clear to auscultation bilaterally, rhonchi L>R, mostly at bases  Abdomen: Soft, non tender, non distended, bowel sounds present, no guarding  Extremities: No edema, pulses DP and PT palpable bilaterally  Neuro: Grossly nonfocal  Data Reviewed: Basic Metabolic Panel:  Lab 02/22/12 9562 02/21/12 1415  NA 133*  136  K 3.4* 4.2  CL 97 99  CO2 29 28  GLUCOSE 97 131*  BUN 12 12  CREATININE 0.73 0.80  CALCIUM 8.1* 8.9  MG -- 2.5  PHOS -- 3.1   Liver Function Tests:  Lab 02/21/12 1415  AST 23  ALT 11  ALKPHOS 113  BILITOT 0.4  PROT 6.8  ALBUMIN 2.9*   CBC:  Lab 02/22/12 0430 02/21/12 1415  WBC 9.8 11.2*  NEUTROABS -- 9.8*  HGB 11.9* 13.6  HCT 37.6* 40.7  MCV 82.1 80.3  PLT 194 183    Recent Results (from the past 240 hour(s))  CULTURE, BLOOD  (ROUTINE X 2)     Status: Normal (Preliminary result)   Collection Time   02/21/12  4:25 PM      Component Value Range Status Comment   Specimen Description BLOOD LEFT HAND   Final    Special Requests BOTTLES DRAWN AEROBIC AND ANAEROBIC 5CC   Final    Culture  Setup Time 02/21/2012 22:19   Final    Culture     Final    Value:        BLOOD CULTURE RECEIVED NO GROWTH TO DATE CULTURE WILL BE HELD FOR 5 DAYS BEFORE ISSUING A FINAL NEGATIVE REPORT   Report Status PENDING   Incomplete   CULTURE, BLOOD (ROUTINE X 2)     Status: Normal (Preliminary result)   Collection Time   02/21/12  4:35 PM      Component Value Range Status Comment   Specimen Description BLOOD RIGHT ARM   Final    Special Requests BOTTLES DRAWN AEROBIC AND ANAEROBIC 5CC   Final    Culture  Setup Time 02/21/2012 22:19   Final    Culture     Final    Value:        BLOOD CULTURE RECEIVED NO GROWTH TO DATE CULTURE WILL BE HELD FOR 5 DAYS BEFORE ISSUING A FINAL NEGATIVE REPORT   Report Status PENDING   Incomplete   CULTURE, EXPECTORATED SPUTUM-ASSESSMENT     Status: Normal   Collection Time   02/21/12  6:12 PM      Component Value Range Status Comment   Specimen Description SPUTUM   Final    Special Requests Immunocompromised   Final    Sputum evaluation     Final    Value: MICROSCOPIC FINDINGS SUGGEST THAT THIS SPECIMEN IS NOT REPRESENTATIVE OF LOWER RESPIRATORY SECRETIONS. PLEASE RECOLLECT.     RESULTS CALLED TO AND VERIFIED WITH Zachery Dakins [id number] @ 2001 BY J SCOTTON   Report Status 02/21/2012 FINAL   Final   CULTURE, EXPECTORATED SPUTUM-ASSESSMENT     Status: Normal   Collection Time   02/22/12  8:40 AM      Component Value Range Status Comment   Specimen Description SPUTUM   Final    Special Requests Normal   Final    Sputum evaluation     Final    Value: THIS SPECIMEN IS ACCEPTABLE. RESPIRATORY CULTURE REPORT TO FOLLOW.   Report Status 02/22/2012 FINAL   Final      Scheduled Meds:   . ALPRAZolam  0.5 mg Oral  Daily  . azithromycin  500 mg Intravenous Q24H  . cefTRIAXone  IV  1 g Intravenous Q24H  . enoxaparin  30 mg Subcutaneous Q24H  . fluticasone  2 spray Each Nare Daily  . gabapentin  600 mg Oral TID  . metoCLOPramide  5 mg Oral QID  . metoprolol  tartrate  25 mg Oral BID  . mometasone-formoterol  2 puff Inhalation BID  . ondansetron  4 mg Oral TID  . roflumilast  500 mcg Oral Daily  . theophylline  200 mg Oral BID  . tiotropium  18 mcg Inhalation Daily  . tiZANidine  4 mg Oral Q8H   Continuous Infusions:    Debbora Presto, MD  [facility] Pager [phone]  If 7PM-7AM, please contact night-coverage [email or web address] Password [email or web address] 02/22/2012, 1:31 PM   LOS: 1 day

## 2012-02-22 NOTE — Progress Notes (Signed)
INITIAL ADULT NUTRITION ASSESSMENT Date: 02/22/2012   Time: 12:55 PM Reason for Assessment: MST  INTERVENTION: 1.  General healthful diet; encourage intake of food and beverages 2.  Supplements; Ensure BID, MVI once daily 3.  Nutrition-related medication; question whether pt would benefit from appetite stimulant due to poor wt hx with demonstrated inability to wt gain without steroids.   DOCUMENTATION CODES Per approved criteria  -Severe malnutrition in the context of chronic illness     ASSESSMENT: Male 66 y.o.  Dx: Shortness of breath  Hx:  Past Medical History  Diagnosis Date  . COPD (chronic obstructive pulmonary disease)   . Nodule of left lung   . Mass of lung   . Adenocarcinoma of lung   . CHF (congestive heart failure)   . PONV (postoperative nausea and vomiting)    Past Surgical History  Procedure Date  . Left video-assisted thoracic surgery, resection 08/24/10    Dr Edwyna Shell    Related Meds:  Scheduled Meds:   . [COMPLETED] sodium chloride   Intravenous STAT  . [COMPLETED] acetaminophen  650 mg Oral Once  . [COMPLETED] albuterol  5 mg Nebulization Once  . ALPRAZolam  0.5 mg Oral Daily  . [COMPLETED] azithromycin  500 mg Intravenous Once  . azithromycin  500 mg Intravenous Q24H  . [COMPLETED] cefTRIAXone (ROCEPHIN)  IV  1 g Intravenous Once  . cefTRIAXone (ROCEPHIN)  IV  1 g Intravenous Q24H  . enoxaparin  30 mg Subcutaneous Q24H  . fluticasone  2 spray Each Nare Daily  . gabapentin  600 mg Oral TID  . [COMPLETED] ipratropium  0.5 mg Nebulization Once  . metoCLOPramide  5 mg Oral QID  . metoprolol tartrate  25 mg Oral BID  . mometasone-formoterol  2 puff Inhalation BID  . ondansetron  4 mg Oral TID  . roflumilast  500 mcg Oral Daily  . [COMPLETED] sodium chloride  500 mL Intravenous Once  . theophylline  200 mg Oral BID  . tiotropium  18 mcg Inhalation Daily  . tiZANidine  4 mg Oral Q8H  . [DISCONTINUED] fluticasone  2 spray Each Nare Daily  .  [DISCONTINUED] gabapentin  600 mg Oral TID  . [DISCONTINUED] salmeterol  1 puff Inhalation BID   Continuous Infusions:  PRN Meds:.albuterol, albuterol, chlorpheniramine-HYDROcodone, oxyCODONE   Ht: 6\' 4"  (193 cm)  Wt: 124 lb 12.5 oz (56.6 kg) (bed scale)  Ideal Wt: 86.3 kg % Ideal Wt: 65%  Usual Wt: difficult to determine, pt states 165 lbs, but has not weighed this in >1 yr % Usual Wt: 75%  Body mass index is 15.19 kg/(m^2).  Food/Nutrition Related Hx: variable appetite and intake PTA  Labs:  CMP     Component Value Date/Time   NA 133* 02/22/2012 0430   K 3.4* 02/22/2012 0430   CL 97 02/22/2012 0430   CO2 29 02/22/2012 0430   GLUCOSE 97 02/22/2012 0430   BUN 12 02/22/2012 0430   CREATININE 0.73 02/22/2012 0430   CALCIUM 8.1* 02/22/2012 0430   PROT 6.8 02/21/2012 1415   ALBUMIN 2.9* 02/21/2012 1415   AST 23 02/21/2012 1415   ALT 11 02/21/2012 1415   ALKPHOS 113 02/21/2012 1415   BILITOT 0.4 02/21/2012 1415   GFRNONAA >90 02/22/2012 0430   GFRAA >90 02/22/2012 0430    CBC    Component Value Date/Time   WBC 9.8 02/22/2012 0430   RBC 4.58 02/22/2012 0430   HGB 11.9* 02/22/2012 0430   HCT 37.6* 02/22/2012 0430  PLT 194 02/22/2012 0430   MCV 82.1 02/22/2012 0430   MCH 26.0 02/22/2012 0430   MCHC 31.6 02/22/2012 0430   RDW 16.0* 02/22/2012 0430   LYMPHSABS 0.5* 02/21/2012 1415   MONOABS 0.9 02/21/2012 1415   EOSABS 0.0 02/21/2012 1415   BASOSABS 0.0 02/21/2012 1415    Intake: "I'm eating good" Output:   Intake/Output Summary (Last 24 hours) at 02/22/12 1259 Last data filed at 02/22/12 0708  Gross per 24 hour  Intake 1308.33 ml  Output    650 ml  Net 658.33 ml    Diet Order: General  Supplements/Tube Feeding: none at this time  IVF:    Estimated Nutritional Needs:   Kcal: [id number] Protein: 85-100g Fluid: ~2.0 L/day  Pt admitted with shortness of breath, found to have community acquired PNA.  Pt with PMH of lung adenocarcinoma. Pt states  that he weighed 165 lbs prior to surgery for removal of lung mass on 08/24/2010.  He states that within the week, he had lost 40 lbs and at d/c weighed 125 lbs.  RD is unable to find documentation re: wt loss through chart review from admission during that time.  Since this wt loss, pt states his weight has been variable between 120-130 lbs depending on whether he is taking steroids.  He is unable to retain gain wt when not on steroids.  Pt states that PTA, his appetite was poor, however currently is improved.  Pt with moderate to severe wasting at temples and clavicles.  Pt with 6% wt change over the past year.  Per limited food recall provided by pt, RD estimates that pt is consuming <75% of estimated needs.  Pt qualifies for severe malnutrition of chronic illness. Pt denies need for education or counseling re: wt at this time.  He does not seem to think his wt and nutrition status are of issue and denies nutrition-related goals.  He is agreeable to resuming medication that would improve his appetite.  NUTRITION DIAGNOSIS: -Inadequate oral intake (NI-2.1).  Status: Ongoing  RELATED TO: nausea, poor appetite  AS EVIDENCE BY: pt report, physical findings on exam  MONITORING/EVALUATION(Goals): 1.  Food/[place]; pt to increase intake to >75% of meals  EDUCATION NEEDS: -Education needs addressed   Loyce Dys, MS RD LDN Clinical Inpatient Dietitian Pager: [phone] Weekend/After hours pager: [phone]

## 2012-02-22 NOTE — Progress Notes (Signed)
   CARE MANAGEMENT NOTE 02/22/2012  Patient:  Stephen Fitzpatrick, Stephen Fitzpatrick   Account Number:  [id number]  Date Initiated:  02/22/2012  Documentation initiated by:  Jiles Crocker  Subjective/Objective Assessment:   ADMITTED WITH SOB/ PNEUMONIA     Action/Plan:   PCP: Rudi Heap, MD  LIVES AT HOME WITH SPOUSE   Anticipated DC Date:  02/25/2012   Anticipated DC Plan:  HOME/SELF CARE      DC Planning Services  CM consult        Status of service:  In process, will continue to follow Medicare Important Message given?  NA - LOS <3 / Initial given by admissions (If response is "NO", the following Medicare IM given date fields will be blank)  Per UR Regulation:  Reviewed for med. necessity/level of care/duration of stay  Comments:  02/21/2012- B Robie Oats RN,BSN,MHA

## 2012-02-23 DIAGNOSIS — R0602 Shortness of breath: Secondary | ICD-10-CM

## 2012-02-23 LAB — CBC
HCT: 35.4 % — ABNORMAL LOW (ref 39.0–52.0)
Hemoglobin: 11.2 g/dL — ABNORMAL LOW (ref 13.0–17.0)
RDW: 15.7 % — ABNORMAL HIGH (ref 11.5–15.5)
WBC: 9.8 10*3/uL (ref 4.0–10.5)

## 2012-02-23 LAB — BASIC METABOLIC PANEL
BUN: 13 mg/dL (ref 6–23)
Chloride: 99 mEq/L (ref 96–112)
GFR calc Af Amer: >90 mL/min (ref >90)
Glucose, Bld: 110 mg/dL — ABNORMAL HIGH (ref 70–99)
Potassium: 4.2 mEq/L (ref 3.5–5.1)

## 2012-02-23 MED ORDER — GUAIFENESIN ER 600 MG PO TB12
600.0000 mg | ORAL_TABLET | Freq: Two times a day (BID) | ORAL | Status: DC
  Administered 2012-02-23 – 2012-02-28 (×11): 600 mg via ORAL
  Filled 2012-02-23 (×13): qty 1

## 2012-02-23 MED ORDER — BENZONATATE 100 MG PO CAPS
200.0000 mg | ORAL_CAPSULE | Freq: Three times a day (TID) | ORAL | Status: DC
  Administered 2012-02-23 – 2012-02-28 (×15): 200 mg via ORAL
  Filled 2012-02-23 (×17): qty 2

## 2012-02-23 MED ORDER — ALBUTEROL SULFATE (5 MG/ML) 0.5% IN NEBU: 2.5000 mg | INHALATION_SOLUTION | RESPIRATORY_TRACT | Status: DC | PRN

## 2012-02-23 MED ORDER — ALBUTEROL SULFATE (5 MG/ML) 0.5% IN NEBU
2.5000 mg | INHALATION_SOLUTION | Freq: Three times a day (TID) | RESPIRATORY_TRACT | Status: DC
  Administered 2012-02-23 – 2012-02-28 (×15): 2.5 mg via RESPIRATORY_TRACT
  Filled 2012-02-23 (×15): qty 0.5

## 2012-02-23 NOTE — Progress Notes (Signed)
[facility] PROGRESS NOTE  Stephen Fitzpatrick:[id number] DOB: [birth year] DOA: 02/21/2012 PCP: Rudi Heap, MD  Assessment/Plan: Principal Problem:  *Shortness of breath Active Problems:  Adenocarcinoma of Left Lower Lobe lung  PNA (pneumonia)  Leukocytosis    1. PNA (pneumonia): Patient presented wit a productive cough, worsening SOB, a leukocytosis of 11.2, as well as CXR, consistent with community-acquired LLL pneumonia. Urinary Strept. Pneumoniae antigen was positive. Managing with iv Rocephin/Azithriomucin now day# 3, with gradual clinical improvement. He feels better today, has remained apyrexial, but is still troubled by frequent cough. Mucinex and Tessalon added to treatment. Continue nebulizers and oxygen supplementation.  2. Adenocarcinoma of Left Lower Lobe lung  This apparently in remission.  3. Shortness of breath:   Secondary to community acquired pneumonia, as well as chronic lung disease. Responding to above measures.     Code Status: Full Code.  Family Communication:  Disposition Plan: Aiming possible discharge on 02/25/12.   Brief narrative: 66 yo male with known history of lung cancer, presenting with  4-5 days of progressively worsening shortness of breath, associated with green sputum production, fevers, chills, exertional dyspnea, poor oral intake. No recent sicknesses or hospitalizations, no recent travelling. In ED, CXR indicated PNA. He was admitted for further management.    Consultants:  N/A  Procedures:  CXR.  Antibiotics:  Rocephin 02/21/12>>>  Azithromycin 02/21/12>>>  HPI/Subjective: Feels much better. Has pain left lower rib cage, especially during coughing.   Objective: Vital signs in last 24 hours: Temp:  [98.3 F (36.8 C)-99.9 F (37.7 C)] 98.6 F (37 C) (11/16 0500) Pulse Rate:  [70-91] 86  (11/16 0500) Resp:  [18-20] 18  (11/16 0500) BP: (86-147)/(51-65) 147/60 mmHg (11/16 0500) SpO2:  [92 %-94 %] 94 % (11/16  1043) Weight:  [60.6 kg (133 lb 9.6 oz)] 60.6 kg (133 lb 9.6 oz) (11/16 0500) Weight change: 4.6 kg (10 lb 2.3 oz) Last BM Date: 02/20/12  Intake/Output from previous day: 11/15 0701 - 11/16 0700 In: 1020 [P.O.:720; IV Piggyback:300] Out: 1075 [Urine:1075] Total I/O In: -  Out: 650 [Urine:650]   Physical Exam: General: Alert, communicative, fully oriented, not short of breath at rest. Coughing frequently.  HEENT:  No clinical pallor, no jaundice, no conjunctival injection or discharge. Hydration is fair.  NECK:  Supple, JVP not seen, no carotid bruits, no palpable lymphadenopathy, no palpable goiter. CHEST:  Coarse crackles left base. Localized tenderness left lower rib cage.  HEART:  Sounds 1 and 2 heard, normal, regular, no murmurs. ABDOMEN:  Full, soft, non-tender, no palpable organomegaly, no palpable masses, normal bowel sounds. GENITALIA:  Not examined. LOWER EXTREMITIES:  No pitting edema, palpable peripheral pulses. MUSCULOSKELETAL SYSTEM:  Generalized osteoarthritic changes, otherwise, normal. CENTRAL NERVOUS SYSTEM:  No focal neurologic deficit on gross examination.  Lab Results:  [facility] 02/23/12 0441 02/22/12 0430  WBC 9.8 9.8  HGB 11.2* 11.9*  HCT 35.4* 37.6*  PLT 205 194    Basename 02/23/12 0441 02/22/12 0430  NA 133* 133*  K 4.2 3.4*  CL 99 97  CO2 30 29  GLUCOSE 110* 97  BUN 13 12  CREATININE 0.80 0.73  CALCIUM 8.1* 8.1*   Recent Results (from the past 240 hour(s))  CULTURE, BLOOD (ROUTINE X 2)     Status: Normal (Preliminary result)   Collection Time   02/21/12  4:25 PM      Component Value Range Status Comment   Specimen Description BLOOD LEFT HAND   Final    Special Requests  BOTTLES DRAWN AEROBIC AND ANAEROBIC 5CC   Final    Culture  Setup Time 02/21/2012 22:19   Final    Culture     Final    Value:        BLOOD CULTURE RECEIVED NO GROWTH TO DATE CULTURE WILL BE HELD FOR 5 DAYS BEFORE ISSUING A FINAL NEGATIVE REPORT   Report Status PENDING    Incomplete   CULTURE, BLOOD (ROUTINE X 2)     Status: Normal (Preliminary result)   Collection Time   02/21/12  4:35 PM      Component Value Range Status Comment   Specimen Description BLOOD RIGHT ARM   Final    Special Requests BOTTLES DRAWN AEROBIC AND ANAEROBIC 5CC   Final    Culture  Setup Time 02/21/2012 22:19   Final    Culture     Final    Value:        BLOOD CULTURE RECEIVED NO GROWTH TO DATE CULTURE WILL BE HELD FOR 5 DAYS BEFORE ISSUING A FINAL NEGATIVE REPORT   Report Status PENDING   Incomplete   CULTURE, EXPECTORATED SPUTUM-ASSESSMENT     Status: Normal   Collection Time   02/21/12  6:12 PM      Component Value Range Status Comment   Specimen Description SPUTUM   Final    Special Requests Immunocompromised   Final    Sputum evaluation     Final    Value: MICROSCOPIC FINDINGS SUGGEST THAT THIS SPECIMEN IS NOT REPRESENTATIVE OF LOWER RESPIRATORY SECRETIONS. PLEASE RECOLLECT.     RESULTS CALLED TO AND VERIFIED WITH Zachery Dakins [id number] @ 2001 BY J SCOTTON   Report Status 02/21/2012 FINAL   Final   CULTURE, EXPECTORATED SPUTUM-ASSESSMENT     Status: Normal   Collection Time   02/22/12  8:40 AM      Component Value Range Status Comment   Specimen Description SPUTUM   Final    Special Requests Normal   Final    Sputum evaluation     Final    Value: THIS SPECIMEN IS ACCEPTABLE. RESPIRATORY CULTURE REPORT TO FOLLOW.   Report Status 02/22/2012 FINAL   Final   CULTURE, RESPIRATORY     Status: Normal (Preliminary result)   Collection Time   02/22/12  8:40 AM      Component Value Range Status Comment   Specimen Description SPUTUM   Final    Special Requests NONE   Final    Gram Stain     Final    Value: ABUNDANT WBC PRESENT, PREDOMINANTLY PMN     FEW SQUAMOUS EPITHELIAL CELLS PRESENT     FEW GRAM POSITIVE COCCI IN PAIRS     FEW GRAM POSITIVE RODS     RARE GRAM NEGATIVE RODS   Culture PENDING   Incomplete    Report Status PENDING   Incomplete      Studies/Results: Dg  Chest 2 View  02/21/2012  *RADIOLOGY REPORT*  Clinical Data: SOB; cough x 4 days; hx of lung CA, part of lung removed; smoker; HTN;  CHEST - 2 VIEW  Comparison: 09/01/2011  Findings: There is irregular scarring and nodularity in the vicinity of the superior segment left lower lobe.  This is in the vicinity or of the prior wedge resection and tumor.  Patchy opacity and interstitial accentuation noted in the left lower lobe, with mild lingular scarring.  Underlying emphysema noted.  Mild nodularity noted projecting over the right lung base, including a 7 mm  nodule which is projecting adjacent to the right tenth rib.  Cardiac and mediastinal contours appear unremarkable.  IMPRESSION:  1.  Nodular density posteriorly in the superior segment left lower lobe in the vicinity of the wedge resection, with adjacent radiating linear opacities.  This probably represents postoperative scarring and is relatively similar to the prior exam.  Certainly residual tumor in this vicinity cannot be excluded. 2.  Indistinct patchy primarily interstitial opacities in the left lower lobe, query early bronchopneumonia. Follow-up to ensure clearance recommended. 3.  Scattered small nodules in the right lung, similar to prior. These would likely be better followed by periodic CT scan in this patient with history lung cancer with regard to stability and growth.   Original Report Authenticated By: Gaylyn Rong, M.D.     Medications: Scheduled Meds:   . ALPRAZolam  0.5 mg Oral Daily  . azithromycin  500 mg Intravenous Q24H  . cefTRIAXone (ROCEPHIN)  IV  1 g Intravenous Q24H  . enoxaparin  30 mg Subcutaneous Q24H  . fluticasone  2 spray Each Nare Daily  . gabapentin  600 mg Oral TID  . metoCLOPramide  5 mg Oral QID  . metoprolol tartrate  25 mg Oral BID  . mometasone-formoterol  2 puff Inhalation BID  . ondansetron  4 mg Oral TID  . [COMPLETED] potassium chloride  40 mEq Oral Once  . roflumilast  500 mcg Oral Daily  .  theophylline  200 mg Oral BID  . tiotropium  18 mcg Inhalation Daily  . tiZANidine  4 mg Oral Q8H   Continuous Infusions:  PRN Meds:.albuterol, albuterol, chlorpheniramine-HYDROcodone, oxyCODONE    LOS: 2 days   Kiondre Grenz,CHRISTOPHER  [facility] Pager [phone]. If 8PM-8AM, please contact night-coverage at [email or web address], password [email or web address] 02/23/2012, 12:18 PM  LOS: 2 days

## 2012-02-24 LAB — EXPECTORATED SPUTUM ASSESSMENT W GRAM STAIN, RFLX TO RESP C

## 2012-02-24 LAB — CBC
HCT: 35.3 % — ABNORMAL LOW (ref 39.0–52.0)
Hemoglobin: 11 g/dL — ABNORMAL LOW (ref 13.0–17.0)
MCHC: 31.2 g/dL (ref 30.0–36.0)

## 2012-02-24 LAB — BASIC METABOLIC PANEL
BUN: 9 mg/dL (ref 6–23)
CO2: 30 mEq/L (ref 19–32)
Chloride: 98 mEq/L (ref 96–112)
GFR calc non Af Amer: 89 mL/min — ABNORMAL LOW (ref >90)
Glucose, Bld: 131 mg/dL — ABNORMAL HIGH (ref 70–99)
Potassium: 3.8 mEq/L (ref 3.5–5.1)

## 2012-02-24 LAB — CULTURE, RESPIRATORY W GRAM STAIN

## 2012-02-24 NOTE — Progress Notes (Signed)
[facility] PROGRESS NOTE  Stephen Fitzpatrick ZOX:[id number] DOB: 1945/04/29 DOA: 02/21/2012 PCP: Rudi Heap, MD  Assessment/Plan: Principal Problem:  *Shortness of breath Active Problems:  Adenocarcinoma of Left Lower Lobe lung  PNA (pneumonia)  Leukocytosis    1. PNA (pneumonia): Patient presented with a productive cough, worsening SOB, a leukocytosis of 11.2, as well as CXR, consistent with community-acquired LLL pneumonia. Urinary Strept. Pneumoniae antigen was positive. Managing with iv Rocephin/Azithriomucin now day# 4, with gradual, but steady clinical improvement. Patient has remained apyrexial, cough is less, and phlegm is clearer. Mucinex and Tessalon were added to treatment on 02/23/12. On nebulizers/Oxygen supplementation.  2. Adenocarcinoma of Left Lower Lobe lung  This apparently in remission.  3. Shortness of breath:   Secondary to community acquired pneumonia, as well as chronic lung disease. Responding to above measures.     Code Status: Full Code.  Family Communication:  Disposition Plan: Aiming possible discharge on 02/25/12.   Brief narrative: 66 yo male with known history of lung cancer, presenting with  4-5 days of progressively worsening shortness of breath, associated with green sputum production, fevers, chills, exertional dyspnea, poor oral intake. No recent sicknesses or hospitalizations, no recent travelling. In ED, CXR indicated PNA. He was admitted for further management.    Consultants:  N/A  Procedures:  CXR.  Antibiotics:  Rocephin 02/21/12>>>  Azithromycin 02/21/12>>>  HPI/Subjective: Feels much better.  Objective: Vital signs in last 24 hours: Temp:  [98.8 F (37.1 C)-99.3 F (37.4 C)] 99.3 F (37.4 C) (11/17 1258) Pulse Rate:  [93-101] 93  (11/17 1258) Resp:  [18] 18  (11/17 1258) BP: (101-141)/(54-69) 141/69 mmHg (11/17 1258) SpO2:  [90 %-100 %] 100 % (11/17 1258) Weight:  [61.6 kg (135 lb 12.9 oz)] 61.6 kg (135 lb  12.9 oz) (11/17 0500) Weight change: 1 kg (2 lb 3.3 oz) Last BM Date: 02/20/12  Intake/Output from previous day: 11/16 0701 - 11/17 0700 In: 840 [P.O.:840] Out: 2650 [Urine:2650] Total I/O In: 600 [P.O.:600] Out: -    Physical Exam: General: Alert, communicative, fully oriented, not short of breath at rest. Coughing frequently.  HEENT:  No clinical pallor, no jaundice, no conjunctival injection or discharge. Hydration is fair.  NECK:  Supple, JVP not seen, no carotid bruits, no palpable lymphadenopathy, no palpable goiter. CHEST:  Clinically clear to auscultation. Localized tenderness left lower rib cage.  HEART:  Sounds 1 and 2 heard, normal, regular, no murmurs. ABDOMEN:  Full, soft, non-tender, no palpable organomegaly, no palpable masses, normal bowel sounds. GENITALIA:  Not examined. LOWER EXTREMITIES:  No pitting edema, palpable peripheral pulses. MUSCULOSKELETAL SYSTEM:  Generalized osteoarthritic changes, otherwise, normal. CENTRAL NERVOUS SYSTEM:  No focal neurologic deficit on gross examination.  Lab Results:  Basename 02/24/12 0502 02/23/12 0441  WBC 11.6* 9.8  HGB 11.0* 11.2*  HCT 35.3* 35.4*  PLT 243 205    Basename 02/24/12 0502 02/23/12 0441  NA 135 133*  K 3.8 4.2  CL 98 99  CO2 30 30  GLUCOSE 131* 110*  BUN 9 13  CREATININE 0.84 0.80  CALCIUM 8.1* 8.1*   Recent Results (from the past 240 hour(s))  CULTURE, BLOOD (ROUTINE X 2)     Status: Normal (Preliminary result)   Collection Time   02/21/12  4:25 PM      Component Value Range Status Comment   Specimen Description BLOOD LEFT HAND   Final    Special Requests BOTTLES DRAWN AEROBIC AND ANAEROBIC 5CC   Final  Culture  Setup Time 02/21/2012 22:19   Final    Culture     Final    Value:        BLOOD CULTURE RECEIVED NO GROWTH TO DATE CULTURE WILL BE HELD FOR 5 DAYS BEFORE ISSUING A FINAL NEGATIVE REPORT   Report Status PENDING   Incomplete   CULTURE, BLOOD (ROUTINE X 2)     Status: Normal  (Preliminary result)   Collection Time   02/21/12  4:35 PM      Component Value Range Status Comment   Specimen Description BLOOD RIGHT ARM   Final    Special Requests BOTTLES DRAWN AEROBIC AND ANAEROBIC 5CC   Final    Culture  Setup Time 02/21/2012 22:19   Final    Culture     Final    Value:        BLOOD CULTURE RECEIVED NO GROWTH TO DATE CULTURE WILL BE HELD FOR 5 DAYS BEFORE ISSUING A FINAL NEGATIVE REPORT   Report Status PENDING   Incomplete   CULTURE, EXPECTORATED SPUTUM-ASSESSMENT     Status: Normal   Collection Time   02/21/12  6:12 PM      Component Value Range Status Comment   Specimen Description SPUTUM   Final    Special Requests Immunocompromised   Final    Sputum evaluation     Final    Value: MICROSCOPIC FINDINGS SUGGEST THAT THIS SPECIMEN IS NOT REPRESENTATIVE OF LOWER RESPIRATORY SECRETIONS. PLEASE RECOLLECT.     RESULTS CALLED TO AND VERIFIED WITH Zachery Dakins [id number] @ 2001 BY J SCOTTON   Report Status 02/21/2012 FINAL   Final   CULTURE, EXPECTORATED SPUTUM-ASSESSMENT     Status: Normal   Collection Time   02/22/12  8:40 AM      Component Value Range Status Comment   Specimen Description SPUTUM   Final    Special Requests Normal   Final    Sputum evaluation     Final    Value: THIS SPECIMEN IS ACCEPTABLE. RESPIRATORY CULTURE REPORT TO FOLLOW.   Report Status 02/22/2012 FINAL   Final   CULTURE, RESPIRATORY     Status: Normal   Collection Time   02/22/12  8:40 AM      Component Value Range Status Comment   Specimen Description SPUTUM   Final    Special Requests NONE   Final    Gram Stain     Final    Value: ABUNDANT WBC PRESENT, PREDOMINANTLY PMN     FEW SQUAMOUS EPITHELIAL CELLS PRESENT     FEW GRAM POSITIVE COCCI IN PAIRS     FEW GRAM POSITIVE RODS     RARE GRAM NEGATIVE RODS   Culture NORMAL OROPHARYNGEAL FLORA   Final    Report Status 02/24/2012 FINAL   Final   CULTURE, EXPECTORATED SPUTUM-ASSESSMENT     Status: Normal   Collection Time   02/24/12   5:23 AM      Component Value Range Status Comment   Specimen Description SPUTUM   Final    Special Requests NONE   Final    Sputum evaluation     Final    Value: THIS SPECIMEN IS ACCEPTABLE. RESPIRATORY CULTURE REPORT TO FOLLOW.   Report Status 02/24/2012 FINAL   Final      Studies/Results: No results found.  Medications: Scheduled Meds:    . albuterol  2.5 mg Nebulization TID  . ALPRAZolam  0.5 mg Oral Daily  . azithromycin  500 mg Intravenous  Q24H  . benzonatate  200 mg Oral TID  . cefTRIAXone (ROCEPHIN)  IV  1 g Intravenous Q24H  . enoxaparin  30 mg Subcutaneous Q24H  . fluticasone  2 spray Each Nare Daily  . gabapentin  600 mg Oral TID  . guaiFENesin  600 mg Oral BID  . metoCLOPramide  5 mg Oral QID  . metoprolol tartrate  25 mg Oral BID  . mometasone-formoterol  2 puff Inhalation BID  . ondansetron  4 mg Oral TID  . roflumilast  500 mcg Oral Daily  . theophylline  200 mg Oral BID  . tiotropium  18 mcg Inhalation Daily  . tiZANidine  4 mg Oral Q8H   Continuous Infusions:  PRN Meds:.albuterol, chlorpheniramine-HYDROcodone, oxyCODONE    LOS: 3 days   Camiah Humm,CHRISTOPHER  [facility] Pager [phone]. If 8PM-8AM, please contact night-coverage at [email or web address], password [facility] 02/24/2012, 1:30 PM  LOS: 3 days

## 2012-02-25 ENCOUNTER — Inpatient Hospital Stay (HOSPITAL_COMMUNITY): Payer: Medicare Other

## 2012-02-25 LAB — CBC
MCH: 25.1 pg — ABNORMAL LOW (ref 26.0–34.0)
MCHC: 30.4 g/dL (ref 30.0–36.0)
MCV: 82.6 fL (ref 78.0–100.0)
Platelets: 277 10*3/uL (ref 150–400)
RDW: 15.7 % — ABNORMAL HIGH (ref 11.5–15.5)

## 2012-02-25 LAB — BASIC METABOLIC PANEL
BUN: 11 mg/dL (ref 6–23)
Calcium: 8.5 mg/dL (ref 8.4–10.5)
Creatinine, Ser: 0.81 mg/dL (ref 0.50–1.35)
GFR calc Af Amer: >90 mL/min (ref >90)

## 2012-02-25 MED ORDER — ENOXAPARIN SODIUM 40 MG/0.4ML ~~LOC~~ SOLN
40.0000 mg | SUBCUTANEOUS | Status: DC
  Administered 2012-02-25 – 2012-02-27 (×3): 40 mg via SUBCUTANEOUS
  Filled 2012-02-25 (×4): qty 0.4

## 2012-02-25 NOTE — Progress Notes (Signed)
Patient ID: GEDALYA JIM, male   DOB: 10/09/1945, 66 y.o.   MRN: [id number]  [facility] PROGRESS NOTE  MORDECAI TINDOL JXB:[id number] DOB: January 26, 1946 DOA: 02/21/2012 PCP: Rudi Heap, MD  Brief narrative: Pt is 66 yo male who presents with main concern of progressively worsening shortness of breath, that started 4-5 days prior to admission and has been associated with green sputum production, fevers, chills, exertional dyspnea, poor oral intake. Pt denies recent similar episode but endorses history of lung cancer. Pt denies chest pain, abdominal or urinary concerns, no recent sicknesses or hospitalizations, no recent travelling. In ED, CXR indicated PNA and follow up imaging recommended to ensure resolution of PNA.  Assessment and Plan:  Principal Problem:  *Shortness of breath  - secondary to community acquired PNA - PNA order set used on admission and pt started on Zithromax and Rocephin  - gram stain and culture of the sputum also ordered as well urine legionella and strep, still pending to date  - continue providing supportive care, analgesia as needed, oxygen as needed, incentive spirometry when awake  - place order for CT chest  Active Problems:  PNA (pneumonia)  - community acquired  - continue ABX as noted above, Zithromax and Rocephin  Leukocytosis  - secondary to PNA - antibiotics on board  - this is now resolved  Adenocarcinoma of Left Lower Lobe lung  - in the past and apparently in remission   Radiological Exams on Admission:  Dg Chest 2 View  02/21/2012  IMPRESSION:  1. Nodular density posteriorly in the superior segment left lower lobe in the vicinity of the wedge resection, with adjacent radiating linear opacities. This probably represents postoperative scarring and is relatively similar to the prior exam. Certainly residual tumor in this vicinity cannot be excluded.  2. Indistinct patchy primarily interstitial opacities in the left lower lobe, query early  bronchopneumonia. Follow-up to ensure clearance recommended.  3. Scattered small nodules in the right lung, similar to prior. These would likely be better followed by periodic CT scan in this patient with history lung cancer with regard to stability and growth.   Antibiotics  Zithromax 11/14 -->  Rocephin 11/14 -->  Consultants:  None  Code Status: Full  Family Communication: Pt at bedside   HPI/Subjective: No events overnight.   Objective: Filed Vitals:   02/25/12 1000 02/25/12 1237 02/25/12 1439 02/25/12 1452  BP: 112/74   131/68  Pulse: 91   102  Temp:    97.8 F (36.6 C)  TempSrc:    Oral  Resp:    16  Height:      Weight:      SpO2: 91% 87% 94% 95%    Intake/Output Summary (Last 24 hours) at 02/25/12 1614 Last data filed at 02/25/12 1130  Gross per 24 hour  Intake    480 ml  Output   2225 ml  Net  -1745 ml    Exam:   General:  Pt is alert, follows commands appropriately, not in acute distress  Cardiovascular: Regular rate and rhythm, S1/S2, no murmurs, no rubs, no gallops  Respiratory: Clear to auscultation bilaterally, rhonchi at bases, no wheezing   Abdomen: Soft, non tender, non distended, bowel sounds present, no guarding  Extremities: No edema, pulses DP and PT palpable bilaterally  Neuro: Grossly nonfocal  Data Reviewed: Basic Metabolic Panel:  Lab 02/25/12 1308 02/24/12 0502 02/23/12 0441 02/22/12 0430 02/21/12 1415  NA 134* 135 133* 133* 136  K 4.9 3.8 4.2 3.4*  4.2  CL 97 98 99 97 99  CO2 32 30 30 29 28   GLUCOSE 128* 131* 110* 97 131*  BUN 11 9 13 12 12   CREATININE 0.81 0.84 0.80 0.73 0.80  CALCIUM 8.5 8.1* 8.1* 8.1* 8.9  MG -- -- -- -- 2.5  PHOS -- -- -- -- 3.1   Liver Function Tests:  Lab 02/21/12 1415  AST 23  ALT 11  ALKPHOS 113  BILITOT 0.4  PROT 6.8  ALBUMIN 2.9*   CBC:  Lab 02/25/12 0454 02/24/12 0502 02/23/12 0441 02/22/12 0430 02/21/12 1415  WBC 10.1 11.6* 9.8 9.8 11.2*  NEUTROABS -- -- -- -- 9.8*  HGB 10.4*  11.0* 11.2* 11.9* 13.6  HCT 34.2* 35.3* 35.4* 37.6* 40.7  MCV 82.6 83.1 82.5 82.1 80.3  PLT 277 243 205 194 183    Recent Results (from the past 240 hour(s))  CULTURE, BLOOD (ROUTINE X 2)     Status: Normal (Preliminary result)   Collection Time   02/21/12  4:25 PM      Component Value Range Status Comment   Specimen Description BLOOD LEFT HAND   Final    Special Requests BOTTLES DRAWN AEROBIC AND ANAEROBIC 5CC   Final    Culture  Setup Time 02/21/2012 22:19   Final    Culture     Final    Value:        BLOOD CULTURE RECEIVED NO GROWTH TO DATE CULTURE WILL BE HELD FOR 5 DAYS BEFORE ISSUING A FINAL NEGATIVE REPORT   Report Status PENDING   Incomplete   CULTURE, BLOOD (ROUTINE X 2)     Status: Normal (Preliminary result)   Collection Time   02/21/12  4:35 PM      Component Value Range Status Comment   Specimen Description BLOOD RIGHT ARM   Final    Special Requests BOTTLES DRAWN AEROBIC AND ANAEROBIC 5CC   Final    Culture  Setup Time 02/21/2012 22:19   Final    Culture     Final    Value:        BLOOD CULTURE RECEIVED NO GROWTH TO DATE CULTURE WILL BE HELD FOR 5 DAYS BEFORE ISSUING A FINAL NEGATIVE REPORT   Report Status PENDING   Incomplete   CULTURE, EXPECTORATED SPUTUM-ASSESSMENT     Status: Normal   Collection Time   02/21/12  6:12 PM      Component Value Range Status Comment   Specimen Description SPUTUM   Final    Special Requests Immunocompromised   Final    Sputum evaluation     Final    Value: MICROSCOPIC FINDINGS SUGGEST THAT THIS SPECIMEN IS NOT REPRESENTATIVE OF LOWER RESPIRATORY SECRETIONS. PLEASE RECOLLECT.     RESULTS CALLED TO AND VERIFIED WITH Zachery Dakins [id number] @ 2001 BY J SCOTTON   Report Status 02/21/2012 FINAL   Final   CULTURE, EXPECTORATED SPUTUM-ASSESSMENT     Status: Normal   Collection Time   02/22/12  8:40 AM      Component Value Range Status Comment   Specimen Description SPUTUM   Final    Special Requests Normal   Final    Sputum evaluation      Final    Value: THIS SPECIMEN IS ACCEPTABLE. RESPIRATORY CULTURE REPORT TO FOLLOW.   Report Status 02/22/2012 FINAL   Final   CULTURE, RESPIRATORY     Status: Normal   Collection Time   02/22/12  8:40 AM      Component  Value Range Status Comment   Specimen Description SPUTUM   Final    Special Requests NONE   Final    Gram Stain     Final    Value: ABUNDANT WBC PRESENT, PREDOMINANTLY PMN     FEW SQUAMOUS EPITHELIAL CELLS PRESENT     FEW GRAM POSITIVE COCCI IN PAIRS     FEW GRAM POSITIVE RODS     RARE GRAM NEGATIVE RODS   Culture NORMAL OROPHARYNGEAL FLORA   Final    Report Status 02/24/2012 FINAL   Final   CULTURE, EXPECTORATED SPUTUM-ASSESSMENT     Status: Normal   Collection Time   02/24/12  5:23 AM      Component Value Range Status Comment   Specimen Description SPUTUM   Final    Special Requests NONE   Final    Sputum evaluation     Final    Value: THIS SPECIMEN IS ACCEPTABLE. RESPIRATORY CULTURE REPORT TO FOLLOW.   Report Status 02/24/2012 FINAL   Final   CULTURE, RESPIRATORY     Status: Normal (Preliminary result)   Collection Time   02/24/12  5:23 AM      Component Value Range Status Comment   Specimen Description SPUTUM   Final    Special Requests NONE   Final    Gram Stain     Final    Value: MODERATE WBC PRESENT, PREDOMINANTLY PMN     FEW SQUAMOUS EPITHELIAL CELLS PRESENT     RARE GRAM POSITIVE RODS     RARE GRAM POSITIVE COCCI IN PAIRS     RARE GRAM NEGATIVE RODS   Culture Culture reincubated for better growth   Final    Report Status PENDING   Incomplete      Scheduled Meds:   . albuterol  2.5 mg Nebulization TID  . ALPRAZolam  0.5 mg Oral Daily  . azithromycin  500 mg Intravenous Q24H  . benzonatate  200 mg Oral TID  . cefTRIAXone  IV  1 g Intravenous Q24H  . enoxaparin  40 mg Subcutaneous Q24H  . fluticasone  2 spray Each Nare Daily  . gabapentin  600 mg Oral TID  . guaiFENesin  600 mg Oral BID  . metoCLOPramide  5 mg Oral QID  . metoprolol  tartrate  25 mg Oral BID  . mometasone-formoterol  2 puff Inhalation BID  . ondansetron  4 mg Oral TID  . roflumilast  500 mcg Oral Daily  . theophylline  200 mg Oral BID  . tiotropium  18 mcg Inhalation Daily  . tiZANidine  4 mg Oral Q8H   Continuous Infusions:    Debbora Presto, MD  [facility] Pager [phone]  If 7PM-7AM, please contact night-coverage [email or web address] Password [email or web address] 02/25/2012, 4:14 PM   LOS: 4 days

## 2012-02-26 LAB — CBC
MCHC: 31.5 g/dL (ref 30.0–36.0)
Platelets: 338 10*3/uL (ref 150–400)
RDW: 15.6 % — ABNORMAL HIGH (ref 11.5–15.5)
WBC: 13.4 10*3/uL — ABNORMAL HIGH (ref 4.0–10.5)

## 2012-02-26 LAB — BASIC METABOLIC PANEL
Chloride: 96 mEq/L (ref 96–112)
Creatinine, Ser: 0.88 mg/dL (ref 0.50–1.35)
GFR calc Af Amer: >90 mL/min (ref >90)
GFR calc non Af Amer: 87 mL/min — ABNORMAL LOW (ref >90)
Potassium: 4.7 mEq/L (ref 3.5–5.1)

## 2012-02-26 MED ORDER — LEVOFLOXACIN IN D5W 750 MG/150ML IV SOLN
750.0000 mg | INTRAVENOUS | Status: DC
  Administered 2012-02-26 – 2012-02-27 (×2): 750 mg via INTRAVENOUS
  Filled 2012-02-26 (×3): qty 150

## 2012-02-26 MED ORDER — PROMETHAZINE HCL 25 MG/ML IJ SOLN: 12.5000 mg | INTRAMUSCULAR | Status: DC | PRN

## 2012-02-26 NOTE — Progress Notes (Signed)
Patient ID: Stephen Fitzpatrick, male   DOB: 10-13-1945, 66 y.o.   MRN: [id number]  [facility] PROGRESS NOTE  OBRA BURCKHARD WUJ:[id number] DOB: Jul 01, 1945 DOA: 02/21/2012 PCP: Rudi Heap, MD  Brief narrative:  Pt is 66 yo male who presents with main concern of progressively worsening shortness of breath, that started 4-5 days prior to admission and has been associated with green sputum production, fevers, chills, exertional dyspnea, poor oral intake. Pt denies recent similar episode but endorses history of lung cancer. Pt denies chest pain, abdominal or urinary concerns, no recent sicknesses or hospitalizations, no recent travelling. In ED, CXR indicated PNA and follow up imaging recommended to ensure resolution of PNA.   Assessment and Plan:  Principal Problem:  *Shortness of breath  - secondary to community acquired PNA, Strep Pneumo + 11/14  - PNA order set used on admission and pt started on Zithromax and Rocephin  - CT scan of the chest left greater than right bibasilar airspace disease and since pt was not having much of clinical improvement, PCCM recommendation over the phone was to change coverage to Levaquin - continue providing supportive care, analgesia as needed, oxygen as needed, incentive spirometry when awake  - will need to check oxygen with ambulation prior to discharge as pt will likely be discharged on oxygen  Active Problems:  PNA (pneumonia)  - community acquired  - continue ABX as noted above COPD - progressive and pt will likely require oxygen at home - will have to check oxygen with ambulation  Leukocytosis  - secondary to PNA, now up from yesterday and of unclear etiology - I have mentioned above I will change ABX to Levaquin as recommended by PCCM - CBC in AM Adenocarcinoma of Left Lower Lobe lung  - in the past and apparently in remission  Nausea - unclear etiology - I will continue his scheduled Zofran and will add Phenergan PRN Tachycardia -  likely secondary to Albuterol nebulizer which is provided scheduled - will change nebulizers PRN  Anemia of chronic disease - Hg and Hct are stable and at pt's baseline  Radiological Exams on Admission:  Ct Chest Wo Contrast 02/26/2012   1.  Left greater than right bibasilar airspace disease.  Suspicious for infection or aspiration.  2.  New small bilateral pleural effusions.  3.  Presumed interval resection of the superior segment left lower lobe lung nodule.  Other lung nodules are grossly similar, but some are partially obscured by airspace disease.   4.  Low density right hepatic lobe lesion is favored to have been similar on the prior exam, suggesting stability.  This could be reevaluated at follow-up.   Dg Chest 2 View  02/21/2012  IMPRESSION:  1. Nodular density posteriorly in the superior segment left lower lobe in the vicinity of the wedge resection, with adjacent radiating linear opacities. This probably represents postoperative scarring and is relatively similar to the prior exam. Certainly residual tumor in this vicinity cannot be excluded.  2. Indistinct patchy primarily interstitial opacities in the left lower lobe, query early bronchopneumonia. Follow-up to ensure clearance recommended.  3. Scattered small nodules in the right lung, similar to prior. These would likely be better followed by periodic CT scan in this patient with history lung cancer with regard to stability and growth.   Antibiotics  Zithromax 11/14 --> 11/19 Rocephin 11/14 --> 11/19 Levaquin 11/19 -->  Consultants:  PCCM E-link to discuss choice of antibiotics  Code Status: Full  Family Communication: Pt at  bedside    HPI/Subjective: No events overnight. Pt reports feeling more nauseated this AM and sweaty. Shortness of breath with exertion.   Objective: Filed Vitals:   02/25/12 2013 02/25/12 2024 02/26/12 0513 02/26/12 0927  BP:  147/78 133/73   Pulse:  105 93   Temp:  98.5 F (36.9 C) 98.6 F  (37 C)   TempSrc:  Oral Oral   Resp:  16 16   Height:      Weight:   59.2 kg (130 lb 8.2 oz)   SpO2: 92% 92% 96% 93%    Intake/Output Summary (Last 24 hours) at 02/26/12 1155 Last data filed at 02/26/12 1100  Gross per 24 hour  Intake    600 ml  Output    700 ml  Net   -100 ml    Exam:   General:  Pt is alert, follows commands appropriately, not in acute distress  Cardiovascular: Regular rhythm, tachycardic, S1/S2, no murmurs, no rubs, no gallops  Respiratory: Clear to auscultation bilaterally, crackles at bases  Abdomen: Soft, non tender, non distended, bowel sounds present, no guarding  Extremities: No edema, pulses DP and PT palpable bilaterally  Neuro: Grossly nonfocal  Data Reviewed: Basic Metabolic Panel:  Lab 02/26/12 1610 02/25/12 0454 02/24/12 0502 02/23/12 0441 02/22/12 0430 02/21/12 1415  NA 134* 134* 135 133* 133* --  K 4.7 4.9 3.8 4.2 3.4* --  CL 96 97 98 99 97 --  CO2 29 32 30 30 29  --  GLUCOSE 116* 128* 131* 110* 97 --  BUN 11 11 9 13 12  --  CREATININE 0.88 0.81 0.84 0.80 0.73 --  CALCIUM 8.9 8.5 8.1* 8.1* 8.1* --  MG -- -- -- -- -- 2.5  PHOS -- -- -- -- -- 3.1   Liver Function Tests:  Lab 02/21/12 1415  AST 23  ALT 11  ALKPHOS 113  BILITOT 0.4  PROT 6.8  ALBUMIN 2.9*   CBC:  Lab 02/26/12 0442 02/25/12 0454 02/24/12 0502 02/23/12 0441 02/22/12 0430 02/21/12 1415  WBC 13.4* 10.1 11.6* 9.8 9.8 --  NEUTROABS -- -- -- -- -- 9.8*  HGB 11.2* 10.4* 11.0* 11.2* 11.9* --  HCT 35.6* 34.2* 35.3* 35.4* 37.6* --  MCV 82.6 82.6 83.1 82.5 82.1 --  PLT 338 277 243 205 194 --     Recent Results (from the past 240 hour(s))  CULTURE, BLOOD (ROUTINE X 2)     Status: Normal (Preliminary result)   Collection Time   02/21/12  4:25 PM      Component Value Range Status Comment   Specimen Description BLOOD LEFT HAND   Final    Special Requests BOTTLES DRAWN AEROBIC AND ANAEROBIC 5CC   Final    Culture  Setup Time 02/21/2012 22:19   Final    Culture      Final    Value:        BLOOD CULTURE RECEIVED NO GROWTH TO DATE CULTURE WILL BE HELD FOR 5 DAYS BEFORE ISSUING A FINAL NEGATIVE REPORT   Report Status PENDING   Incomplete   CULTURE, BLOOD (ROUTINE X 2)     Status: Normal (Preliminary result)   Collection Time   02/21/12  4:35 PM      Component Value Range Status Comment   Specimen Description BLOOD RIGHT ARM   Final    Special Requests BOTTLES DRAWN AEROBIC AND ANAEROBIC 5CC   Final    Culture  Setup Time 02/21/2012 22:19   Final  Culture     Final    Value:        BLOOD CULTURE RECEIVED NO GROWTH TO DATE CULTURE WILL BE HELD FOR 5 DAYS BEFORE ISSUING A FINAL NEGATIVE REPORT   Report Status PENDING   Incomplete   CULTURE, EXPECTORATED SPUTUM-ASSESSMENT     Status: Normal   Collection Time   02/21/12  6:12 PM      Component Value Range Status Comment   Specimen Description SPUTUM   Final    Special Requests Immunocompromised   Final    Sputum evaluation     Final    Value: MICROSCOPIC FINDINGS SUGGEST THAT THIS SPECIMEN IS NOT REPRESENTATIVE OF LOWER RESPIRATORY SECRETIONS. PLEASE RECOLLECT.     RESULTS CALLED TO AND VERIFIED WITH Zachery Dakins [id number] @ 2001 BY J SCOTTON   Report Status 02/21/2012 FINAL   Final   CULTURE, EXPECTORATED SPUTUM-ASSESSMENT     Status: Normal   Collection Time   02/22/12  8:40 AM      Component Value Range Status Comment   Specimen Description SPUTUM   Final    Special Requests Normal   Final    Sputum evaluation     Final    Value: THIS SPECIMEN IS ACCEPTABLE. RESPIRATORY CULTURE REPORT TO FOLLOW.   Report Status 02/22/2012 FINAL   Final   CULTURE, RESPIRATORY     Status: Normal   Collection Time   02/22/12  8:40 AM      Component Value Range Status Comment   Specimen Description SPUTUM   Final    Special Requests NONE   Final    Gram Stain     Final    Value: ABUNDANT WBC PRESENT, PREDOMINANTLY PMN     FEW SQUAMOUS EPITHELIAL CELLS PRESENT     FEW GRAM POSITIVE COCCI IN PAIRS     FEW GRAM  POSITIVE RODS     RARE GRAM NEGATIVE RODS   Culture NORMAL OROPHARYNGEAL FLORA   Final    Report Status 02/24/2012 FINAL   Final   CULTURE, EXPECTORATED SPUTUM-ASSESSMENT     Status: Normal   Collection Time   02/24/12  5:23 AM      Component Value Range Status Comment   Specimen Description SPUTUM   Final    Special Requests NONE   Final    Sputum evaluation     Final    Value: THIS SPECIMEN IS ACCEPTABLE. RESPIRATORY CULTURE REPORT TO FOLLOW.   Report Status 02/24/2012 FINAL   Final   CULTURE, RESPIRATORY     Status: Normal (Preliminary result)   Collection Time   02/24/12  5:23 AM      Component Value Range Status Comment   Specimen Description SPUTUM   Final    Special Requests NONE   Final    Gram Stain     Final    Value: MODERATE WBC PRESENT, PREDOMINANTLY PMN     FEW SQUAMOUS EPITHELIAL CELLS PRESENT     RARE GRAM POSITIVE RODS     RARE GRAM POSITIVE COCCI IN PAIRS     RARE GRAM NEGATIVE RODS   Culture Culture reincubated for better growth   Final    Report Status PENDING   Incomplete      Scheduled Meds:   . albuterol  2.5 mg Nebulization TID  . ALPRAZolam  0.5 mg Oral Daily  . benzonatate  200 mg Oral TID  . enoxaparin  40 mg Subcutaneous Q24H  . fluticasone  2 spray Each  Nare Daily  . gabapentin  600 mg Oral TID  . guaiFENesin  600 mg Oral BID  . levofloxacin  IV  750 mg Intravenous Q24H  . metoCLOPramide  5 mg Oral QID  . metoprolol tartrate  25 mg Oral BID  . mometasone-formoterol  2 puff Inhalation BID  . ondansetron  4 mg Oral TID  . roflumilast  500 mcg Oral Daily  . theophylline  200 mg Oral BID  . tiotropium  18 mcg Inhalation Daily  . tiZANidine  4 mg Oral Q8H   Continuous Infusions:    Debbora Presto, MD  [facility] Pager [phone]  If 7PM-7AM, please contact night-coverage [email or web address] Password [email or web address] 02/26/2012, 11:55 AM   LOS: 5 days

## 2012-02-27 DIAGNOSIS — J9600 Acute respiratory failure, unspecified whether with hypoxia or hypercapnia: Secondary | ICD-10-CM

## 2012-02-27 LAB — CBC
HCT: 34.5 % — ABNORMAL LOW (ref 39.0–52.0)
Hemoglobin: 11 g/dL — ABNORMAL LOW (ref 13.0–17.0)
MCHC: 31.9 g/dL (ref 30.0–36.0)
RDW: 15.8 % — ABNORMAL HIGH (ref 11.5–15.5)
WBC: 13.7 10*3/uL — ABNORMAL HIGH (ref 4.0–10.5)

## 2012-02-27 LAB — CULTURE, RESPIRATORY W GRAM STAIN

## 2012-02-27 LAB — CULTURE, BLOOD (ROUTINE X 2)
Culture: NO GROWTH
Culture: NO GROWTH

## 2012-02-27 LAB — BASIC METABOLIC PANEL
BUN: 10 mg/dL (ref 6–23)
Chloride: 95 mEq/L — ABNORMAL LOW (ref 96–112)
GFR calc Af Amer: >90 mL/min (ref >90)
GFR calc non Af Amer: >90 mL/min (ref >90)
Potassium: 4.1 mEq/L (ref 3.5–5.1)
Sodium: 132 mEq/L — ABNORMAL LOW (ref 135–145)

## 2012-02-27 NOTE — Progress Notes (Signed)
SATURATION QUALIFICATIONS:  Patient Saturations on Room Air at Rest = 92%  Patient Saturations on Room Air while Ambulating = 83%  Patient Saturations on 2 Liters of oxygen while Ambulating = 94%

## 2012-02-27 NOTE — Progress Notes (Signed)
  Patient Saturations on Room Air at Rest = 92%  Patient Saturations on [employer] while Ambulating = 84%

## 2012-02-27 NOTE — Progress Notes (Addendum)
[facility] PROGRESS NOTE  Stephen Fitzpatrick WUJ:[id number] DOB: May 15, 1945 DOA: 02/21/2012 PCP: Rudi Heap, MD  Assessment/Plan: Principal Problem:  *Shortness of breath Active Problems:  Adenocarcinoma of Left Lower Lobe lung  PNA (pneumonia)  Leukocytosis    1. PNA (pneumonia): Patient presented with a productive cough, worsening SOB, a leukocytosis of 11.2, as well as CXR, consistent with community-acquired LLL pneumonia. Urinary Strept. Pneumoniae antigen was positive. Managed initially with iv Rocephin/Azithriomucin, with clinical improvement. Mucinex and Tessalon were added to treatment on 02/23/12. On nebulizers/Oxygen supplementation. Wcc initially normalized, and then started going up again on 02/26/12. Chest CT scan on that date, showed left greater than right bibasilar airspace disease. Patient was changed to iv Levaquin after Dr Izola Price discussed with pulmonologist via phone. Today, patient insists that he feels better. Ambulant, apyrexial, appetite has improved and cough is ameliorated and no longer productive. Will attempt to wean off Oxygen. Have discontinued telemetric monitoring today.  2. Adenocarcinoma of Left Lower Lobe lung  This apparently in remission. Chest CT san showed no obvious evidence of disease progression.  3. Shortness of breath/Acute hypoxic respiratory failure:   Secondary to community acquired pneumonia, against a background of chronic lung disease. Responding to above measures. Saturations on Room Air at Rest = 92%, on Room Air while Ambulating = 83%, and on 2 Liters of oxygen while Ambulating = 94%. If now improvement by time of discharge, may need short-term home Oxygen.      Code Status: Full Code.  Family Communication:  Disposition Plan: Aiming possible discharge on 02/29/12.   Brief narrative: 66 yo male with known history of lung cancer, presenting with  4-5 days of progressively worsening shortness of breath, associated with green  sputum production, fevers, chills, exertional dyspnea, poor oral intake. No recent sicknesses or hospitalizations, no recent travelling. In ED, CXR indicated PNA. He was admitted for further management.    Consultants:  Telephone discussion with pulmonologist on 02/26/12.   Procedures:  CXR.  Antibiotics:  Rocephin 02/21/12-02/26/12  Azithromycin 02/21/12-02/26/12  HPI/Subjective: No new issues.  Objective: Vital signs in last 24 hours: Temp:  [98 F (36.7 C)-98.1 F (36.7 C)] 98 F (36.7 C) (11/20 1400) Pulse Rate:  [83-96] 83  (11/20 1400) Resp:  [20] 20  (11/20 1400) BP: (142-151)/(76-85) 150/79 mmHg (11/20 1400) SpO2:  [92 %-100 %] 95 % (11/20 1440) Weight:  [57.5 kg (126 lb 12.2 oz)] 57.5 kg (126 lb 12.2 oz) (11/20 0532) Weight change: -1.7 kg (-3 lb 12 oz) Last BM Date: 02/25/12  Intake/Output from previous day: 11/19 0701 - 11/20 0700 In: 960 [P.O.:960] Out: 620 [Urine:620] Total I/O In: 600 [P.O.:600] Out: -    Physical Exam: General: Alert, communicative, fully oriented, not short of breath at rest.  HEENT:  No clinical pallor, no jaundice, no conjunctival injection or discharge. Hydration is fair.  NECK:  Supple, JVP not seen, no carotid bruits, no palpable lymphadenopathy, no palpable goiter. CHEST:  Clinically clear to auscultation. Localized tenderness left lower rib cage has practically resolved.  HEART:  Sounds 1 and 2 heard, normal, regular, no murmurs. ABDOMEN:  Full, soft, non-tender, no palpable organomegaly, no palpable masses, normal bowel sounds. GENITALIA:  Not examined. LOWER EXTREMITIES:  No pitting edema, palpable peripheral pulses. MUSCULOSKELETAL SYSTEM:  Generalized osteoarthritic changes, otherwise, normal. CENTRAL NERVOUS SYSTEM:  No focal neurologic deficit on gross examination.  Lab Results:  [facility] 02/27/12 0442 02/26/12 0442  WBC 13.7* 13.4*  HGB 11.0* 11.2*  HCT 34.5* 35.6*  PLT 357 338    Basename 02/27/12 0442  02/26/12 0442  NA 132* 134*  K 4.1 4.7  CL 95* 96  CO2 27 29  GLUCOSE 103* 116*  BUN 10 11  CREATININE 0.78 0.88  CALCIUM 8.9 8.9   Recent Results (from the past 240 hour(s))  CULTURE, BLOOD (ROUTINE X 2)     Status: Normal   Collection Time   02/21/12  4:25 PM      Component Value Range Status Comment   Specimen Description BLOOD LEFT HAND   Final    Special Requests BOTTLES DRAWN AEROBIC AND ANAEROBIC 5CC   Final    Culture  Setup Time 02/21/2012 22:19   Final    Culture NO GROWTH 5 DAYS   Final    Report Status 02/27/2012 FINAL   Final   CULTURE, BLOOD (ROUTINE X 2)     Status: Normal   Collection Time   02/21/12  4:35 PM      Component Value Range Status Comment   Specimen Description BLOOD RIGHT ARM   Final    Special Requests BOTTLES DRAWN AEROBIC AND ANAEROBIC 5CC   Final    Culture  Setup Time 02/21/2012 22:19   Final    Culture NO GROWTH 5 DAYS   Final    Report Status 02/27/2012 FINAL   Final   CULTURE, EXPECTORATED SPUTUM-ASSESSMENT     Status: Normal   Collection Time   02/21/12  6:12 PM      Component Value Range Status Comment   Specimen Description SPUTUM   Final    Special Requests Immunocompromised   Final    Sputum evaluation     Final    Value: MICROSCOPIC FINDINGS SUGGEST THAT THIS SPECIMEN IS NOT REPRESENTATIVE OF LOWER RESPIRATORY SECRETIONS. PLEASE RECOLLECT.     RESULTS CALLED TO AND VERIFIED WITH Zachery Dakins [id number] @ 2001 BY J SCOTTON   Report Status 02/21/2012 FINAL   Final   CULTURE, EXPECTORATED SPUTUM-ASSESSMENT     Status: Normal   Collection Time   02/22/12  8:40 AM      Component Value Range Status Comment   Specimen Description SPUTUM   Final    Special Requests Normal   Final    Sputum evaluation     Final    Value: THIS SPECIMEN IS ACCEPTABLE. RESPIRATORY CULTURE REPORT TO FOLLOW.   Report Status 02/22/2012 FINAL   Final   CULTURE, RESPIRATORY     Status: Normal   Collection Time   02/22/12  8:40 AM      Component Value Range  Status Comment   Specimen Description SPUTUM   Final    Special Requests NONE   Final    Gram Stain     Final    Value: ABUNDANT WBC PRESENT, PREDOMINANTLY PMN     FEW SQUAMOUS EPITHELIAL CELLS PRESENT     FEW GRAM POSITIVE COCCI IN PAIRS     FEW GRAM POSITIVE RODS     RARE GRAM NEGATIVE RODS   Culture NORMAL OROPHARYNGEAL FLORA   Final    Report Status 02/24/2012 FINAL   Final   CULTURE, EXPECTORATED SPUTUM-ASSESSMENT     Status: Normal   Collection Time   02/24/12  5:23 AM      Component Value Range Status Comment   Specimen Description SPUTUM   Final    Special Requests NONE   Final    Sputum evaluation     Final    Value: THIS  SPECIMEN IS ACCEPTABLE. RESPIRATORY CULTURE REPORT TO FOLLOW.   Report Status 02/24/2012 FINAL   Final   CULTURE, RESPIRATORY     Status: Normal   Collection Time   02/24/12  5:23 AM      Component Value Range Status Comment   Specimen Description SPUTUM   Final    Special Requests NONE   Final    Gram Stain     Final    Value: MODERATE WBC PRESENT, PREDOMINANTLY PMN     FEW SQUAMOUS EPITHELIAL CELLS PRESENT     RARE GRAM POSITIVE RODS     RARE GRAM POSITIVE COCCI IN PAIRS     RARE GRAM NEGATIVE RODS   Culture FEW STREPTOCOCCUS,BETA HEMOLYIC NOT GROUP A   Final    Report Status 02/27/2012 FINAL   Final      Studies/Results: No results found.  Medications: Scheduled Meds:    . albuterol  2.5 mg Nebulization TID  . ALPRAZolam  0.5 mg Oral Daily  . benzonatate  200 mg Oral TID  . enoxaparin  40 mg Subcutaneous Q24H  . fluticasone  2 spray Each Nare Daily  . gabapentin  600 mg Oral TID  . guaiFENesin  600 mg Oral BID  . levofloxacin (LEVAQUIN) IV  750 mg Intravenous Q24H  . metoCLOPramide  5 mg Oral QID  . metoprolol tartrate  25 mg Oral BID  . mometasone-formoterol  2 puff Inhalation BID  . ondansetron  4 mg Oral TID  . roflumilast  500 mcg Oral Daily  . theophylline  200 mg Oral BID  . tiotropium  18 mcg Inhalation Daily  . tiZANidine   4 mg Oral Q8H   Continuous Infusions:  PRN Meds:.albuterol, chlorpheniramine-HYDROcodone, oxyCODONE, promethazine    LOS: 6 days   Stephen Fitzpatrick,CHRISTOPHER  [facility] Pager [phone]. If 8PM-8AM, please contact night-coverage at [email or web address], password [facility] 02/27/2012, 6:13 PM  LOS: 6 days

## 2012-02-28 LAB — BASIC METABOLIC PANEL
CO2: 27 mEq/L (ref 19–32)
Chloride: 98 mEq/L (ref 96–112)
Glucose, Bld: 116 mg/dL — ABNORMAL HIGH (ref 70–99)
Potassium: 4.6 mEq/L (ref 3.5–5.1)
Sodium: 136 mEq/L (ref 135–145)

## 2012-02-28 LAB — CBC
Hemoglobin: 11.8 g/dL — ABNORMAL LOW (ref 13.0–17.0)
RBC: 4.52 MIL/uL (ref 4.22–5.81)

## 2012-02-28 MED ORDER — OXYCODONE HCL 20 MG PO TABS: 20.0000 mg | ORAL_TABLET | ORAL | Status: DC | PRN

## 2012-02-28 MED ORDER — LEVOFLOXACIN 500 MG PO TABS: 500.0000 mg | ORAL_TABLET | Freq: Every day | ORAL | Status: DC

## 2012-02-28 MED ORDER — BENZONATATE 200 MG PO CAPS: 200.0000 mg | ORAL_CAPSULE | Freq: Three times a day (TID) | ORAL | Status: DC

## 2012-02-28 NOTE — Discharge Summary (Addendum)
Physician Discharge Summary  CLARKE PERETZ WUJ:[id number] DOB: 19-Dec-1945 DOA: 02/21/2012  PCP: Rudi Heap, MD  Admit date: 02/21/2012 Discharge date: 02/28/2012  Time spent: 40 minutes  Recommendations for Outpatient Follow-up:  1. Follow up with primary MD.   Discharge Diagnoses:  Principal Problem:  *Shortness of breath Active Problems:  Adenocarcinoma of Left Lower Lobe lung  PNA (pneumonia)  Leukocytosis  Respiratory failure, acute   Discharge Condition: Satisfactory.   Diet recommendation: Regular.   Filed Weights   02/26/12 0513 02/27/12 0532 02/28/12 0559  Weight: 59.2 kg (130 lb 8.2 oz) 57.5 kg (126 lb 12.2 oz) 55.7 kg (122 lb 12.7 oz)    History of present illness:  66 yo male with known history of lung cancer, presenting with 4-5 days of progressively worsening shortness of breath, associated with green sputum production, fevers, chills, exertional dyspnea, poor oral intake. No recent sicknesses or hospitalizations, no recent travelling. In ED, CXR indicated PNA. He was admitted for further management.    Hospital Course:  1. PNA (pneumonia): Patient presented with a productive cough, worsening SOB, a leukocytosis of 11.2, as well as CXR, consistent with community-acquired LLL pneumonia. Urinary Strept. Pneumoniae antigen was positive. Managed initially with iv Rocephin/Azithriomucin, with clinical improvement. Mucinex and Tessalon were added to treatment on 02/23/12, and supportive treatment with nebulizers/Oxygen supplementation, utilized. Wcc initially normalized, and then started going up again on 02/26/12. Chest CT scan on that date, showed left greater than right bibasilar airspace disease. Patient was changed to iv Levaquin on that date, after Dr Izola Price discussed with pulmonologist via phone. As of 02/27/12, patient felt considerably better, and keen to go home.He was ambulant, apyrexial, appetite had improved and cough, ameliorated and no longer productive. A  further 8 days of oral Levaquin is planned, to be concluded on 03/07/12.  2. Adenocarcinoma of Left Lower Lobe lung  This apparently in remission. Chest CT san showed no obvious evidence of disease progression.  3. Shortness of breath/Acute hypoxic respiratory failure:  Patient was dyspneic and oxygen-requiring at presentation, secondary to community acquired pneumonia, against a background of chronic lung disease. He has responded to above treatment measures. On 02/27/12, the following were documented. Saturations on Room Air at Rest = 92%, on Room Air while Ambulating = 83%, and on 2 Liters of oxygen while Ambulating = 94%. Since then, he has needed no further oxygen, and as of 02/28/12, was saturating at 95%-96% on Room Air, at rest and on ambulation.    Procedures:  See below.   Consultations:  Telephone discussion with pulmonologist.   Discharge Exam: Filed Vitals:   02/27/12 2300 02/28/12 0559 02/28/12 0835 02/28/12 1413  BP: 135/73 130/77  134/79  Pulse: 90 86  82  Temp: 98.8 F (37.1 C) 98 F (36.7 C)  98 F (36.7 C)  TempSrc: Oral Oral  Oral  Resp: 16 16  18   Height:      Weight:  55.7 kg (122 lb 12.7 oz)    SpO2: 96% 95% 95% 95%    General: Alert, communicative, fully oriented, not short of breath at rest.  HEENT: No clinical pallor, no jaundice, no conjunctival injection or discharge. Hydration is fair.  NECK: Supple, JVP not seen, no carotid bruits, no palpable lymphadenopathy, no palpable goiter.  CHEST: Clinically clear to auscultation. Localized tenderness left lower rib cage has practically resolved.  HEART: Sounds 1 and 2 heard, normal, regular, no murmurs.  ABDOMEN: Full, soft, non-tender, no palpable organomegaly, no palpable masses, normal  bowel sounds.  GENITALIA: Not examined.  LOWER EXTREMITIES: No pitting edema, palpable peripheral pulses.  MUSCULOSKELETAL SYSTEM: Generalized osteoarthritic changes, otherwise, normal.  CENTRAL NERVOUS SYSTEM: No focal  neurologic deficit on gross examination.  Discharge Instructions      Discharge Orders    Future Orders Please Complete By Expires   Diet general      Increase activity slowly          Medication List     As of 02/28/2012  2:33 PM    TAKE these medications         albuterol 108 (90 BASE) MCG/ACT inhaler   Commonly known as: PROVENTIL HFA;VENTOLIN HFA   Inhale 2 puffs into the lungs every 4 (four) hours as needed for wheezing.      albuterol (2.5 MG/3ML) 0.083% nebulizer solution   Commonly known as: PROVENTIL   Take 2.5 mg by nebulization every 6 (six) hours as needed. Shortness of breath      ALPRAZolam 0.5 MG tablet   Commonly known as: XANAX   Take 0.5 mg by mouth daily.      benzonatate 200 MG capsule   Commonly known as: TESSALON   Take 1 capsule (200 mg total) by mouth 3 (three) times daily.      chlorpheniramine-HYDROcodone 10-8 MG/5ML Lqcr   Commonly known as: TUSSIONEX   Take 5 mLs by mouth every 12 (twelve) hours as needed.      fluticasone 50 MCG/ACT nasal spray   Commonly known as: FLONASE   Place 2 sprays into the nose daily.      Fluticasone-Salmeterol 100-50 MCG/DOSE Aepb   Commonly known as: ADVAIR   Inhale 1 puff into the lungs every 12 (twelve) hours.      levofloxacin 500 MG tablet   Commonly known as: LEVAQUIN   Take 1 tablet (500 mg total) by mouth daily.      metoCLOPramide 5 MG tablet   Commonly known as: REGLAN   Take 5 mg by mouth 4 (four) times daily.      metoprolol tartrate 25 MG tablet   Commonly known as: LOPRESSOR   Take 25 mg by mouth 2 (two) times daily.      ondansetron 4 MG tablet   Commonly known as: ZOFRAN   Take 4 mg by mouth 3 (three) times daily.      Oxycodone HCl 20 MG Tabs   Take 1 tablet (20 mg total) by mouth every 4 (four) hours as needed.      roflumilast 500 MCG Tabs tablet   Commonly known as: DALIRESP   Take 500 mcg by mouth daily.      theophylline 200 MG 12 hr tablet   Commonly known as: THEODUR    Take 200 mg by mouth 2 (two) times daily.      tiotropium 18 MCG inhalation capsule   Commonly known as: SPIRIVA   Place 18 mcg into inhaler and inhale daily.         Follow-up Information    Schedule an appointment as soon as possible for a visit with Rudi Heap, MD.   Contact information:   [address] [place] [address] [phone]           The results of significant diagnostics from this hospitalization (including imaging, microbiology, ancillary and laboratory) are listed below for reference.    Significant Diagnostic Studies: Dg Chest 2 View  02/21/2012  *RADIOLOGY REPORT*  Clinical Data: SOB; cough x 4 days; hx  of lung CA, part of lung removed; smoker; HTN;  CHEST - 2 VIEW  Comparison: 09/01/2011  Findings: There is irregular scarring and nodularity in the vicinity of the superior segment left lower lobe.  This is in the vicinity or of the prior wedge resection and tumor.  Patchy opacity and interstitial accentuation noted in the left lower lobe, with mild lingular scarring.  Underlying emphysema noted.  Mild nodularity noted projecting over the right lung base, including a 7 mm nodule which is projecting adjacent to the right tenth rib.  Cardiac and mediastinal contours appear unremarkable.  IMPRESSION:  1.  Nodular density posteriorly in the superior segment left lower lobe in the vicinity of the wedge resection, with adjacent radiating linear opacities.  This probably represents postoperative scarring and is relatively similar to the prior exam.  Certainly residual tumor in this vicinity cannot be excluded. 2.  Indistinct patchy primarily interstitial opacities in the left lower lobe, query early bronchopneumonia. Follow-up to ensure clearance recommended. 3.  Scattered small nodules in the right lung, similar to prior. These would likely be better followed by periodic CT scan in this patient with history lung cancer with regard to stability and growth.   Original  Report Authenticated By: Gaylyn Rong, M.D.    Ct Chest Wo Contrast  02/26/2012  **ADDENDUM** CREATED: 02/26/2012 08:41:28  Not mentioned above is a pleural-based increased density along the posterior left upper lobe which is likely due to calcification and secondary prior left-sided hemothorax or empyema and/or adjacent pulmonary postoperative change.  Image 30/series 2.  **END ADDENDUM** SIGNED BY: Karn Cassis. Reche Dixon, M.D.   02/26/2012  *RADIOLOGY REPORT*  Clinical Data: Worsening shortness of breath.  Fevers.  New pneumonia.  History of lung cancer.  Adenocarcinoma of left lung; status post wedge resection May 2012.  CT CHEST WITHOUT CONTRAST  Technique:  Multidetector CT imaging of the chest was performed following the standard protocol without IV contrast.  Comparison: Plain film of 02/21/2012.  Chest CT of 07/04/2011 and PET of 07/12/2011.  Findings: Lungs/pleura: Secretions or aspiration within the bronchus intermedius on image 36/series 5.  Lower lobe predominant bronchial wall thickening is greater on the right than left and new since 07/04/2010.  Moderate centrilobular emphysema. Mild nodularity at the right lung base laterally on image 59/series 5, likely similar to on the prior CT.  Similar findings on image 52/series 5.  Calcified granuloma in the right upper lobe.  A 4 mm right upper lobe nodule on image 22/series 5 is unchanged. Left greater than right bibasilar airspace disease.  The superior segment left lower lobe lung nodule is no longer identified and was presumably resected.  This is however in the region of airspace disease.  Small left greater than right bilateral pleural effusions.  Heart/Mediastinum: No supraclavicular adenopathy.  Moderate cardiomegaly.  Trace pericardial fluid which is new.  No mediastinal or definite hilar adenopathy, given limitations of unenhanced CT.  Upper abdomen: Old granulomatous disease in the liver.  Subtle hypoattenuation in the posterior right hepatic  lobe on image 69 measures 2.0 cm.  Incompletely characterized. Favored to be present on image 64/series 2 of the prior CT. Probable cysts in the right hepatic lobe on image 64/series 2 and more inferiorly on image 76/series 2. Normal adrenal glands.  Bones/Musculoskeletal:  No acute osseous abnormality.  IMPRESSION:  1.  Left greater than right bibasilar airspace disease.  Suspicious for infection or aspiration. 2.  New small bilateral pleural effusions. 3.  Presumed interval resection  of the superior segment left lower lobe lung nodule.  Other lung nodules are grossly similar, but some are partially obscured by airspace disease.  4.  Low density right hepatic lobe lesion is favored to have been similar on the prior exam, suggesting stability.  This could be reevaluated at follow-up.  If definitive characterization is desired, pre and post contrast outpatient abdominal MRI should be considered.   Original Report Authenticated By: Jeronimo Greaves, M.D.     Microbiology: Recent Results (from the past 240 hour(s))  CULTURE, BLOOD (ROUTINE X 2)     Status: Normal   Collection Time   02/21/12  4:25 PM      Component Value Range Status Comment   Specimen Description BLOOD LEFT HAND   Final    Special Requests BOTTLES DRAWN AEROBIC AND ANAEROBIC 5CC   Final    Culture  Setup Time 02/21/2012 22:19   Final    Culture NO GROWTH 5 DAYS   Final    Report Status 02/27/2012 FINAL   Final   CULTURE, BLOOD (ROUTINE X 2)     Status: Normal   Collection Time   02/21/12  4:35 PM      Component Value Range Status Comment   Specimen Description BLOOD RIGHT ARM   Final    Special Requests BOTTLES DRAWN AEROBIC AND ANAEROBIC 5CC   Final    Culture  Setup Time 02/21/2012 22:19   Final    Culture NO GROWTH 5 DAYS   Final    Report Status 02/27/2012 FINAL   Final   CULTURE, EXPECTORATED SPUTUM-ASSESSMENT     Status: Normal   Collection Time   02/21/12  6:12 PM      Component Value Range Status Comment   Specimen  Description SPUTUM   Final    Special Requests Immunocompromised   Final    Sputum evaluation     Final    Value: MICROSCOPIC FINDINGS SUGGEST THAT THIS SPECIMEN IS NOT REPRESENTATIVE OF LOWER RESPIRATORY SECRETIONS. PLEASE RECOLLECT.     RESULTS CALLED TO AND VERIFIED WITH Zachery Dakins [id number] @ 2001 BY J SCOTTON   Report Status 02/21/2012 FINAL   Final   CULTURE, EXPECTORATED SPUTUM-ASSESSMENT     Status: Normal   Collection Time   02/22/12  8:40 AM      Component Value Range Status Comment   Specimen Description SPUTUM   Final    Special Requests Normal   Final    Sputum evaluation     Final    Value: THIS SPECIMEN IS ACCEPTABLE. RESPIRATORY CULTURE REPORT TO FOLLOW.   Report Status 02/22/2012 FINAL   Final   CULTURE, RESPIRATORY     Status: Normal   Collection Time   02/22/12  8:40 AM      Component Value Range Status Comment   Specimen Description SPUTUM   Final    Special Requests NONE   Final    Gram Stain     Final    Value: ABUNDANT WBC PRESENT, PREDOMINANTLY PMN     FEW SQUAMOUS EPITHELIAL CELLS PRESENT     FEW GRAM POSITIVE COCCI IN PAIRS     FEW GRAM POSITIVE RODS     RARE GRAM NEGATIVE RODS   Culture NORMAL OROPHARYNGEAL FLORA   Final    Report Status 02/24/2012 FINAL   Final   CULTURE, EXPECTORATED SPUTUM-ASSESSMENT     Status: Normal   Collection Time   02/24/12  5:23 AM      Component  Value Range Status Comment   Specimen Description SPUTUM   Final    Special Requests NONE   Final    Sputum evaluation     Final    Value: THIS SPECIMEN IS ACCEPTABLE. RESPIRATORY CULTURE REPORT TO FOLLOW.   Report Status 02/24/2012 FINAL   Final   CULTURE, RESPIRATORY     Status: Normal   Collection Time   02/24/12  5:23 AM      Component Value Range Status Comment   Specimen Description SPUTUM   Final    Special Requests NONE   Final    Gram Stain     Final    Value: MODERATE WBC PRESENT, PREDOMINANTLY PMN     FEW SQUAMOUS EPITHELIAL CELLS PRESENT     RARE GRAM POSITIVE  RODS     RARE GRAM POSITIVE COCCI IN PAIRS     RARE GRAM NEGATIVE RODS   Culture FEW STREPTOCOCCUS,BETA HEMOLYIC NOT GROUP A   Final    Report Status 02/27/2012 FINAL   Final      Labs: Basic Metabolic Panel:  Lab 02/28/12 2956 02/27/12 0442 02/26/12 0442 02/25/12 0454 02/24/12 0502  NA 136 132* 134* 134* 135  K 4.6 4.1 4.7 4.9 3.8  CL 98 95* 96 97 98  CO2 27 27 29  32 30  GLUCOSE 116* 103* 116* 128* 131*  BUN 12 10 11 11 9   CREATININE 0.74 0.78 0.88 0.81 0.84  CALCIUM 9.4 8.9 8.9 8.5 8.1*  MG -- -- -- -- --  PHOS -- -- -- -- --   Liver Function Tests: No results found for this basename: AST:5,ALT:5,ALKPHOS:5,BILITOT:5,PROT:5,ALBUMIN:5 in the last 168 hours No results found for this basename: LIPASE:5,AMYLASE:5 in the last 168 hours No results found for this basename: AMMONIA:5 in the last 168 hours CBC:  Lab 02/28/12 0502 02/27/12 0442 02/26/12 0442 02/25/12 0454 02/24/12 0502  WBC 12.7* 13.7* 13.4* 10.1 11.6*  NEUTROABS -- -- -- -- --  HGB 11.8* 11.0* 11.2* 10.4* 11.0*  HCT 36.8* 34.5* 35.6* 34.2* 35.3*  MCV 81.4 82.3 82.6 82.6 83.1  PLT 437* 357 338 277 243   Cardiac Enzymes: No results found for this basename: CKTOTAL:5,CKMB:5,CKMBINDEX:5,TROPONINI:5 in the last 168 hours BNP: BNP (last 3 results) No results found for this basename: PROBNP:3 in the last 8760 hours CBG: No results found for this basename: GLUCAP:5 in the last 168 hours     Signed:  Breaunna Gottlieb,CHRISTOPHER  [facility] 02/28/2012, 2:33 PM

## 2012-06-02 ENCOUNTER — Encounter: Payer: Medicare Other | Admitting: Internal Medicine

## 2012-06-10 IMAGING — CR DG CHEST 1V PORT
2 series · 2 of 2 positions shown · non-contrast
Comparison: Chest x-ray 08/22/2010.

CLINICAL DATA: Status post left VATS

PORTABLE CHEST - 1 VIEW

[AP (1 of 2)]
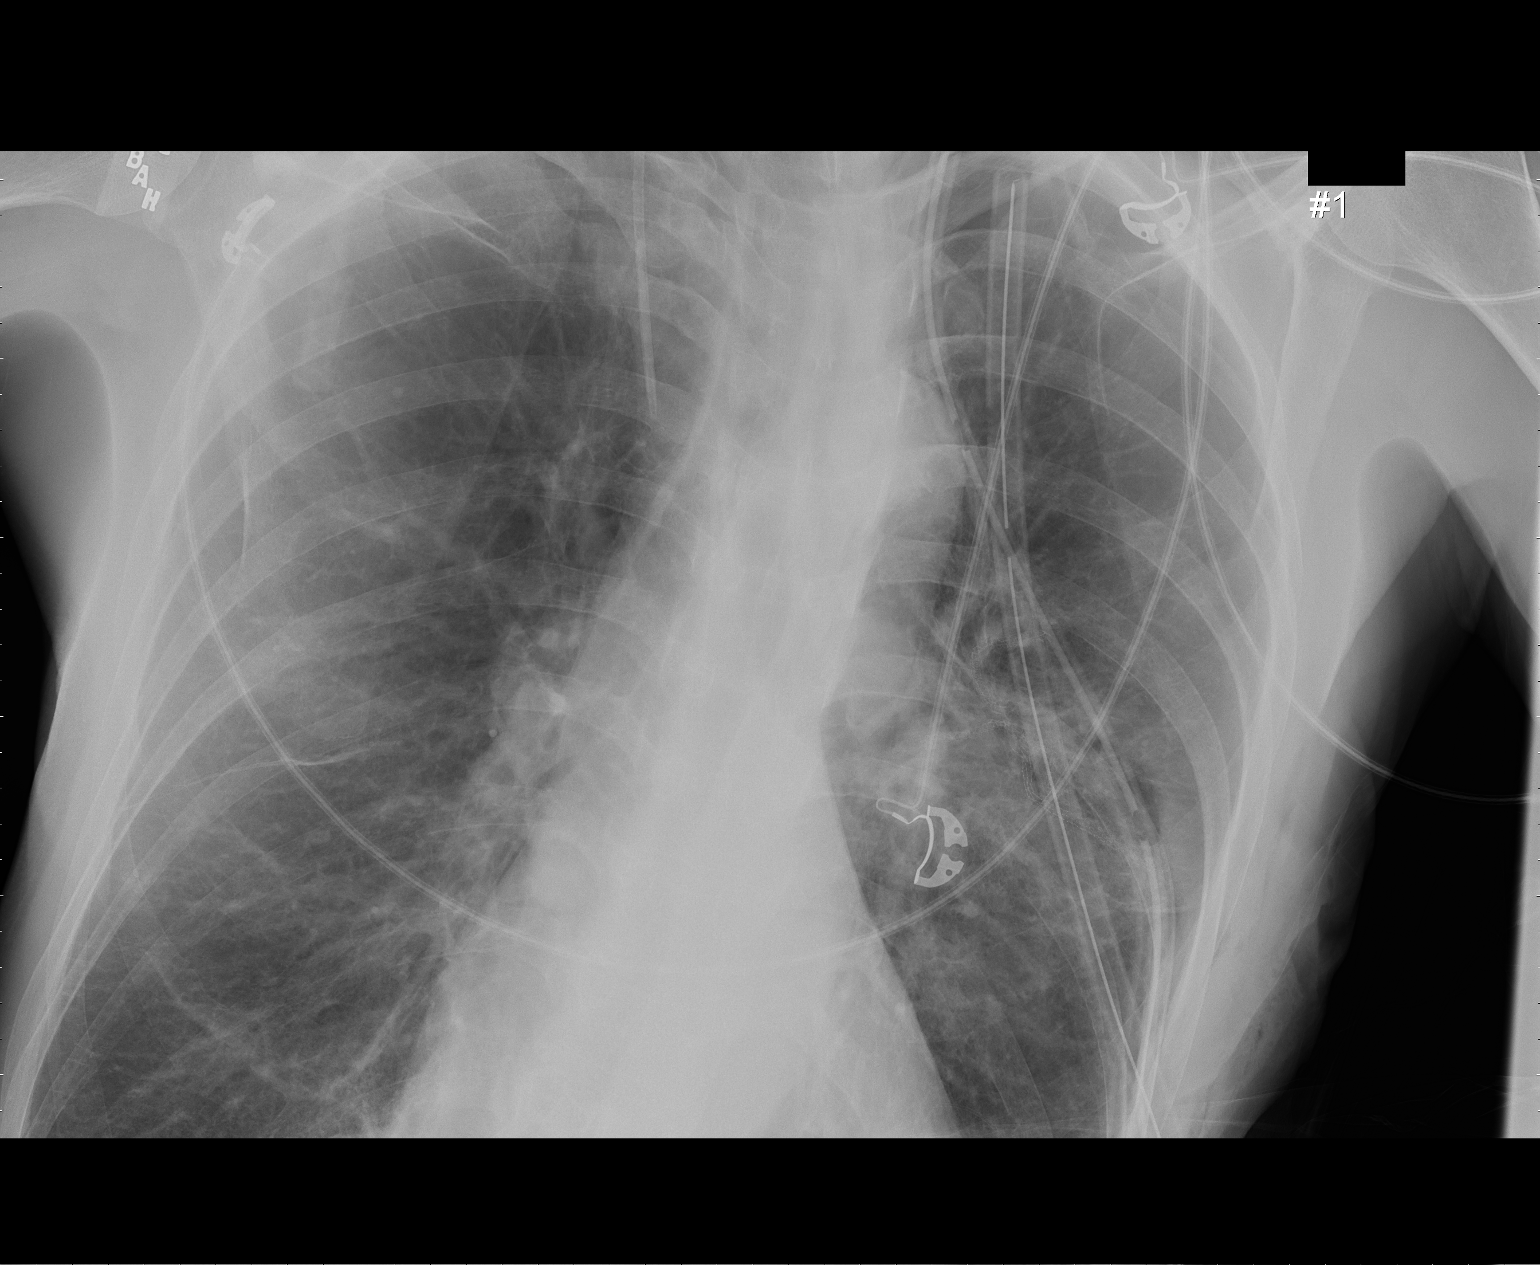

[AP (2 of 2)]
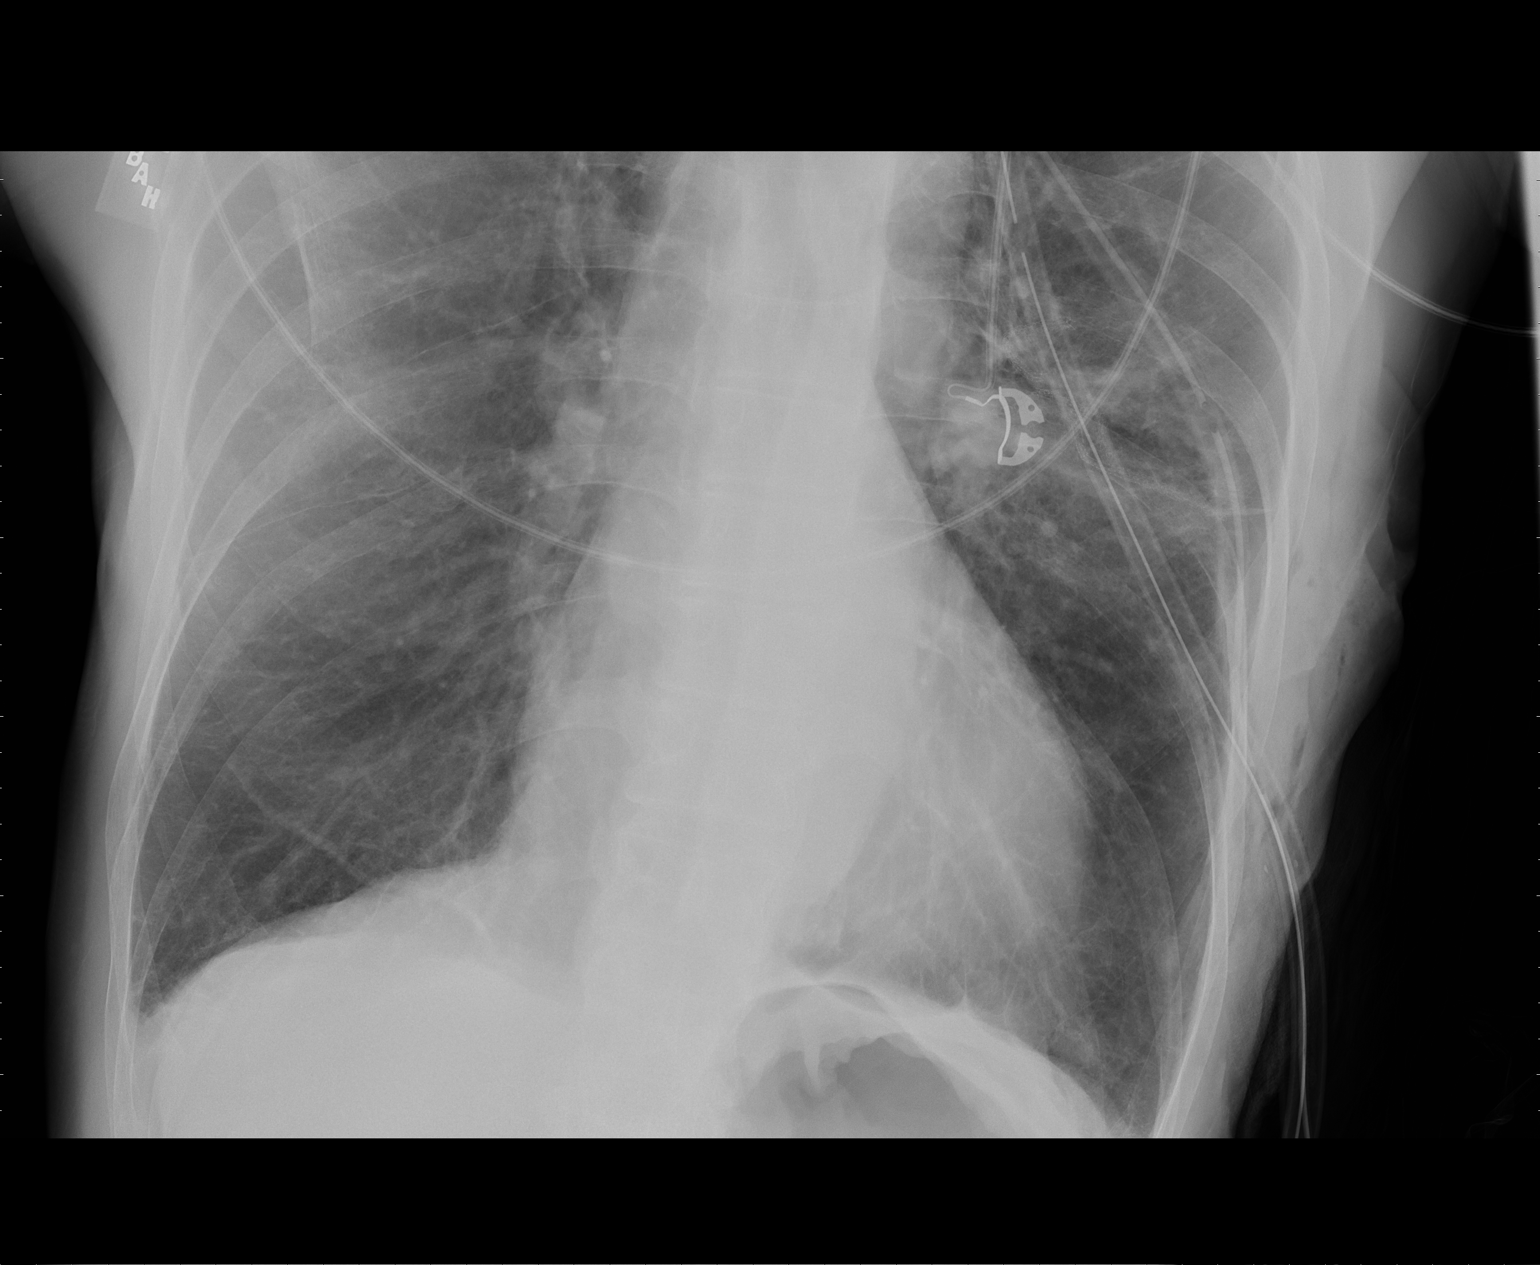

[2 of 2 positions shown; findings below may reference images not displayed]

FINDINGS: There are two left-sided chest tube is in good position.
There is a left-sided pneumothoraxa estimated at 15%.  The right IJ
catheter is in the mid SVC.  There are postoperative changes
involving the left lung with staple lines and associated
atelectasis and probable hematoma.  No pulmonary edema or pleural
effusion.
IMPRESSION: 1.  Expected postoperative changes in the left lung.
2.  Two left-sided chest tubes with a 15% left-sided pneumothorax.
3.  Right IJ catheter in good position without complicating
features.

## 2012-06-25 ENCOUNTER — Encounter: Payer: Medicare Other | Admitting: Internal Medicine

## 2012-06-25 DIAGNOSIS — C3490 Malignant neoplasm of unspecified part of unspecified bronchus or lung: Secondary | ICD-10-CM

## 2012-06-25 DIAGNOSIS — J449 Chronic obstructive pulmonary disease, unspecified: Secondary | ICD-10-CM

## 2012-07-16 ENCOUNTER — Other Ambulatory Visit: Payer: Self-pay | Admitting: *Deleted

## 2012-07-16 MED ORDER — IPRATROPIUM-ALBUTEROL 0.5-2.5 (3) MG/3ML IN SOLN: 3.0000 mL | Freq: Two times a day (BID) | RESPIRATORY_TRACT | Status: DC

## 2012-07-16 NOTE — Telephone Encounter (Signed)
Patient last saw DFS on 12-31-11. Last filled on 06-14-12. Please advise. Thank you

## 2012-08-13 ENCOUNTER — Other Ambulatory Visit: Payer: Self-pay | Admitting: Nurse Practitioner

## 2012-08-14 NOTE — Telephone Encounter (Signed)
See no Theophylline level in chart at all.  Last filled 01/29/13  Last seen 12/31/11

## 2012-09-09 DIAGNOSIS — R0602 Shortness of breath: Secondary | ICD-10-CM

## 2012-11-22 ENCOUNTER — Other Ambulatory Visit: Payer: Self-pay | Admitting: Family Medicine

## 2012-11-22 ENCOUNTER — Other Ambulatory Visit: Payer: Self-pay | Admitting: Nurse Practitioner

## 2012-11-25 NOTE — Telephone Encounter (Signed)
Last seen 9/13  DFS

## 2012-11-25 NOTE — Telephone Encounter (Signed)
Last seen 9/13   DFS   Do not see any Theophylline level ever done in chart  Refilled 01/30/12  MMM no refills

## 2013-02-09 ENCOUNTER — Ambulatory Visit (INDEPENDENT_AMBULATORY_CARE_PROVIDER_SITE_OTHER): Payer: Medicare Other | Admitting: Family Medicine

## 2013-02-09 ENCOUNTER — Encounter (INDEPENDENT_AMBULATORY_CARE_PROVIDER_SITE_OTHER): Payer: Self-pay

## 2013-02-09 ENCOUNTER — Encounter: Payer: Self-pay | Admitting: Family Medicine

## 2013-02-09 VITALS — BP 122/75 | HR 83 | Temp 98.4°F | Ht 76.0 in | Wt 146.0 lb

## 2013-02-09 DIAGNOSIS — J449 Chronic obstructive pulmonary disease, unspecified: Secondary | ICD-10-CM

## 2013-02-09 DIAGNOSIS — E785 Hyperlipidemia, unspecified: Secondary | ICD-10-CM

## 2013-02-09 DIAGNOSIS — Z0000 Encounter for general adult medical examination without abnormal findings: Secondary | ICD-10-CM

## 2013-02-09 DIAGNOSIS — R350 Frequency of micturition: Secondary | ICD-10-CM

## 2013-02-09 LAB — POCT CBC
Granulocyte percent: 82.7 %G — AB (ref 37–80)
HCT, POC: 44.1 % (ref 43.5–53.7)
Hemoglobin: 14.7 g/dL (ref 14.1–18.1)
Lymph, poc: 1.9 (ref 0.6–3.4)
MCH, POC: 27.8 pg (ref 27–31.2)
MCHC: 33.4 g/dL (ref 31.8–35.4)
MCV: 83.1 fL (ref 80–97)
MPV: 7.7 fL (ref 0–99.8)
POC Granulocyte: 11.2 — AB (ref 2–6.9)
POC LYMPH PERCENT: 13.7 %L (ref 10–50)
Platelet Count, POC: 146 10*3/uL (ref 142–424)
RBC: 5.3 M/uL (ref 4.69–6.13)
RDW, POC: 14.7 %
WBC: 13.6 10*3/uL — AB (ref 4.6–10.2)

## 2013-02-09 MED ORDER — ACLIDINIUM BROMIDE 400 MCG/ACT IN AEPB: 400.0000 ug | INHALATION_SPRAY | Freq: Two times a day (BID) | RESPIRATORY_TRACT | Status: DC

## 2013-02-09 NOTE — Patient Instructions (Addendum)
Schedule appointment with eye doctor for glaucoma screen.Chronic Obstructive Pulmonary Disease Chronic obstructive pulmonary disease (COPD) is a condition in which airflow from the lungs is restricted. The lungs can never return to normal, but there are measures you can take which will improve them and make you feel better. CAUSES   Smoking.  Exposure to secondhand smoke.  Breathing in irritants such as air pollution, dust, cigarette smoke, strong odors, aerosol sprays, or paint fumes.  History of lung infections. SYMPTOMS   Deep, persistent (chronic) cough with a large amount of thick mucus.  Wheezing.  Shortness of breath, especially with physical activity.  Feeling like you cannot get enough air.  Difficulty breathing.  Rapid breaths (tachypnea).  Gray or bluish discoloration (cyanosis) of the skin, especially in fingers, toes, or lips.  Fatigue.  Weight loss.  Swelling in legs, ankles, or feet.  Fast heartbeat (tachycardia).  Frequent lung infections.   Chest tightness. DIAGNOSIS  Initial diagnosis may be based on your history, symptoms, and physical examination. Additional tests for COPD may include:  Chest X-ray.  Computed tomography (CT) scan.  Lung (pulmonary) function tests.  Blood tests. TREATMENT  Treatment focuses on making you comfortable (supportive care). Your caregiver may prescribe medicines (inhaled or pills) to help improve your breathing. Additional treatment options may include oxygen therapy and pulmonary rehabilitation. Treatment should also include reducing your exposure to known irritants and following a plan to stop smoking. HOME CARE INSTRUCTIONS   Take all medicines, including antibiotic medicines, as directed by your caregiver.  Use inhaled medicines as directed by your caregiver.  Avoid medicines or cough syrups that dry up your airway (antihistamines) and slow down the elimination of secretions. This decreases respiratory capacity  and may lead to infections.  If you smoke, stop smoking.  Avoid exposure to smoke, chemicals, and fumes that aggravate your breathing.  Avoid contact with individuals that have a contagious illness.  Avoid extreme temperature and humidity changes.  Use humidifiers at home and at your bedside if they do not make breathing difficult.  Drink enough water and fluids to keep your urine clear or pale yellow. This loosens secretions.  Eat healthy foods. Eating smaller, more frequent meals and resting before meals may help you maintain your strength.  Ask your caregiver about the use of vitamins and mineral supplements.  Stay active. Exercise and physical activity will help maintain your ability to do things you want to do.  Balance activity with periods of rest.  Assume a position of comfort if you become short of breath.  Learn and use relaxation techniques.  Learn and use controlled breathing techniques as directed by your caregiver. Controlled breathing techniques include:  Pursed lip breathing. This breathing technique starts with breathing in (inhaling) through your nose for 1 second. Next, purse your lips as if you were going to whistle. Then breathe out (exhale) through the pursed lips for 2 seconds.  Diaphragmatic breathing. Start by putting one hand on your abdomen just above your waist. Inhale slowly through your nose. The hand on your abdomen should move out. Then exhale slowly through pursed lips. You should be able to feel the hand on your abdomen moving in as you exhale.  Learn and use controlled coughing to clear mucus from your lungs. Controlled coughing is a series of short, progressive coughs. The steps of controlled coughing are: 1. Lean your head slightly forward. 2. Breathe in deeply using diaphragmatic breathing. 3. Try to hold your breath for 3 seconds. 4. Keep  your mouth slightly open while coughing twice. 5. Spit any mucus out into a tissue. 6. Rest and repeat  the steps once or twice as needed.  Receive all protective vaccines your caregiver suggests, especially pneumococcal and influenza vaccines.  Learn to manage stress.  Schedule and attend all follow-up appointments as directed by your caregiver. It is important to keep all your appointments.  Participate in pulmonary rehabilitation as directed by your caregiver.  Use home oxygen as suggested. SEEK MEDICAL CARE IF:   You are coughing up more mucus than usual.  There is a change in the color or thickness of the mucus.  Breathing is more labored than usual.  Your breathing is faster than usual.  Your skin color is more cyanotic than usual.  You are running out of the medicine you take for your breathing.  You are anxious, apprehensive, or restless.  You have a fever. SEEK IMMEDIATE MEDICAL CARE IF:   You have a rapid heart rate.  You have shortness of breath while you are resting.  You have shortness of breath that prevents you from being able to talk.  You have shortness of breath that prevents you from performing your usual physical activities.  You have chest pain lasting longer than 5 minutes.  You have a seizure.  Your family or friends notice that you are agitated or confused. MAKE SURE YOU:   Understand these instructions.  Will watch your condition.  Will get help right away if you are not doing well or get worse. Document Released: 01/03/2005 Document Revised: 12/19/2011 Document Reviewed: 05/26/2010 [facility] Patient Information 2014 [place], [place].

## 2013-02-09 NOTE — Progress Notes (Signed)
  Subjective:    Patient ID: Stephen Fitzpatrick, male    DOB: 16-Feb-1946, 67 y.o.   MRN: [id number]  HPI  This 67 y.o. male presents for evaluation of Periodic Physical Exam.  He has hx of COPD and He has been doing fine and is not having an exacerbation.  He is asking to have his theophylline  Increased.  He c/o SOB sometimes and states he has difficulty getting the advair and pro air down To his lungs.  He has a bulge in his right groin.  He denies any testicular pain or enlargement of groin  Pain.   Review of Systems C/o SOB, right groin bulge. No chest pain, SOB, HA, dizziness, vision change, N/V, diarrhea, constipation, dysuria, urinary urgency or frequency, myalgias, arthralgias or rash.     Objective:   Physical Exam Vital signs noted  Well developed well nourished male.  HEENT - Head atraumatic Normocephalic                Eyes - PERRLA, Conjuctiva - clear Sclera- Clear EOMI                Ears - EAC's Wnl TM's Wnl Gross Hearing WNL                Nose - Nares patent                 Throat - oropharanx wnl Respiratory - Lungs CTA bilateral Cardiac - RRR S1 and S2 without murmur GI - Abdomen soft Nontender and bowel sounds active x 4 GU - Testes without masses and no inguinal hernia.  Possible right indirect Hernia or bulging in the right inguinal region.  No change with the bulge with  Valsalva.  No tenderness. Extremities - No edema. Neuro - Grossly intact.       Assessment & Plan:  COPD (chronic obstructive pulmonary disease) - Plan: Aclidinium Bromide (TUDORZA PRESSAIR) 400 MCG/ACT AEPB, POCT CBC, CMP14+EGFR, Lipid panel, Thyroid Panel With TSH, PSA, total and free, Theophylline level.  Add [employer] and see if this helps #2 samples and if having worsening of COPD then follow up.  Routine general medical examination at a health care facility - Plan: POCT CBC, CMP14+EGFR, Lipid panel, Thyroid Panel With TSH, PSA, total and free  Urine frequency - Plan: PSA, total and  free  Other and unspecified hyperlipidemia - Plan: Lipid panel  Right inguinal hernia - At this time he doesn't want referral.  Discussed with patient it could be a bulge or a hernia that is starting because it doesn't change or protrude further With valsalva.  Discussed that he needs to follow up if developing right inguinal or testicular pain. Change with cough or Valsalva.  Left Lung cancer- He was seen by Oncology 6 months ago and states he had some imaging.  Continue to follow up with heme/ onc.  Deatra Canter FNP

## 2013-02-10 ENCOUNTER — Other Ambulatory Visit: Payer: Self-pay | Admitting: Family Medicine

## 2013-02-10 DIAGNOSIS — D751 Secondary polycythemia: Secondary | ICD-10-CM

## 2013-02-10 LAB — CMP14+EGFR
ALT: 6 IU/L (ref 0–44)
AST: 14 IU/L (ref 0–40)
Albumin/Globulin Ratio: 2.4 (ref 1.1–2.5)
Albumin: 4.6 g/dL (ref 3.6–4.8)
Alkaline Phosphatase: 98 IU/L (ref 39–117)
BUN/Creatinine Ratio: 27 — ABNORMAL HIGH (ref 10–22)
BUN: 24 mg/dL (ref 8–27)
CO2: 22 mmol/L (ref 18–29)
Calcium: 9.1 mg/dL (ref 8.6–10.2)
Chloride: 104 mmol/L (ref 97–108)
Creatinine, Ser: 0.88 mg/dL (ref 0.76–1.27)
GFR calc Af Amer: 102 mL/min/{1.73_m2} (ref >59)
GFR calc non Af Amer: 88 mL/min/{1.73_m2} (ref >59)
Globulin, Total: 1.9 g/dL (ref 1.5–4.5)
Glucose: 94 mg/dL (ref 65–99)
Potassium: 4.7 mmol/L (ref 3.5–5.2)
Sodium: 146 mmol/L — ABNORMAL HIGH (ref 134–144)
Total Bilirubin: 0.4 mg/dL (ref 0.0–1.2)
Total Protein: 6.5 g/dL (ref 6.0–8.5)

## 2013-02-10 LAB — PSA, TOTAL AND FREE
PSA, Free Pct: 17.9 %
PSA, Free: 0.34 ng/mL
PSA: 1.9 ng/mL (ref 0.0–4.0)

## 2013-02-10 LAB — LIPID PANEL
Chol/HDL Ratio: 2.7 ratio units (ref 0.0–5.0)
Cholesterol, Total: 161 mg/dL (ref 100–199)
HDL: 59 mg/dL (ref >39)
LDL Calculated: 90 mg/dL (ref 0–99)
Triglycerides: 60 mg/dL (ref 0–149)
VLDL Cholesterol Cal: 12 mg/dL (ref 5–40)

## 2013-02-10 LAB — THYROID PANEL WITH TSH
Free Thyroxine Index: 1.9 (ref 1.2–4.9)
T3 Uptake Ratio: 25 % (ref 24–39)
T4, Total: 7.4 ug/dL (ref 4.5–12.0)
TSH: 3.84 u[IU]/mL (ref 0.450–4.500)

## 2013-02-10 LAB — THEOPHYLLINE LEVEL: Theophylline Lvl: <0.9 ug/mL — ABNORMAL LOW (ref 10.0–20.0)

## 2013-02-18 ENCOUNTER — Telehealth: Payer: Self-pay | Admitting: *Deleted

## 2013-02-18 NOTE — Telephone Encounter (Signed)
Message copied by Bearl Mulberry on Wed Feb 18, 2013  2:08 PM ------      Message from: Stephen Fitzpatrick      Created: Wed Feb 18, 2013  1:46 PM       Theophylline level that is low is okay.  Theophylline is older med used for Copd and paitent's who are takng it need levels to be drawn to make sure it is not elevated and      Patient is not having theophylline toxicity.  So it is good the theophylline level is low.  Would not recommend increase in theophylline if COPD exacerbates.  Continue with theoph since it has been      Part of regimen for some time.  No need to DC just continue with current regimen. ------

## 2013-02-18 NOTE — Telephone Encounter (Signed)
Pt notified of results Verbalizes understanding 

## 2013-02-19 ENCOUNTER — Encounter (HOSPITAL_COMMUNITY): Payer: Self-pay

## 2013-02-19 ENCOUNTER — Encounter (HOSPITAL_COMMUNITY): Payer: PRIVATE HEALTH INSURANCE | Attending: Hematology and Oncology

## 2013-02-19 VITALS — BP 129/74 | HR 92 | Temp 97.6°F | Resp 18 | Ht 76.0 in | Wt 151.3 lb

## 2013-02-19 DIAGNOSIS — D45 Polycythemia vera: Secondary | ICD-10-CM | POA: Insufficient documentation

## 2013-02-19 DIAGNOSIS — J449 Chronic obstructive pulmonary disease, unspecified: Secondary | ICD-10-CM

## 2013-02-19 DIAGNOSIS — D72829 Elevated white blood cell count, unspecified: Secondary | ICD-10-CM

## 2013-02-19 DIAGNOSIS — D751 Secondary polycythemia: Secondary | ICD-10-CM

## 2013-02-19 LAB — CARBOXYHEMOGLOBIN
Methemoglobin: 0.9 % (ref 0.0–1.5)
O2 Saturation: 97.2 %
Total oxygen content: 18.7 mL/dL (ref 15.0–23.0)

## 2013-02-19 LAB — LACTATE DEHYDROGENASE: LDH: 141 U/L (ref 94–250)

## 2013-02-19 LAB — BLOOD GAS, ARTERIAL
Acid-base deficit: 0.6 mmol/L (ref 0.0–2.0)
O2 Saturation: 97.3 %
Patient temperature: 37
TCO2: 20.3 mmol/L (ref 0–100)
pH, Arterial: 7.429 (ref 7.350–7.450)

## 2013-02-19 LAB — CBC WITH DIFFERENTIAL/PLATELET
Basophils Absolute: 0 10*3/uL (ref 0.0–0.1)
Eosinophils Absolute: 0.6 10*3/uL (ref 0.0–0.7)
Eosinophils Relative: 7 % — ABNORMAL HIGH (ref 0–5)
MCH: 28.1 pg (ref 26.0–34.0)
MCV: 87.9 fL (ref 78.0–100.0)
Monocytes Absolute: 1 10*3/uL (ref 0.1–1.0)
Platelets: 172 10*3/uL (ref 150–400)
RDW: 14.6 % (ref 11.5–15.5)

## 2013-02-19 NOTE — Progress Notes (Signed)
[facility]   Consult Note:  Name: Stephen Fitzpatrick Date: 02/19/2013 MRN: [id number] DOB: [birth year]  PCP: Rudi Heap, MD   REFERRING PHYSICIAN: Deatra Canter, FNP  REASON FOR REFERRAL: Polycythemia   HISTORY OF PRESENT ILLNESS:Stephen Fitzpatrick is a 67 y.o. male with history of COPD, Adeno cancer of the left lung, status post surgery in 2012,  did not require chemoradiation therapy at that time was seen by the primary care physician recently ,  noted to have elevated hemoglobin and hematocrit subsequently referred here for further management. Patient states that during his childhood he was anemic and was received iron pills. And got better. He does not recall any of his family members having elevated hemoglobin and hematocrit. He denies any headaches blurred vision complaints of occasional headaches. He does not take any testosterone injections. The only change in his medication is a new medication called Aclidnium bromide. He was noted to have leukocytosis. He follows up with his surgeon once every 6 months for his lung cancer surveillance. He used to smoke cigarettes in the past but he quit smoking when he was diagnosed with lung cancer in 2012 He denies any headaches,  double vision, fevers, chills, night sweats, nausea, vomiting, diarrhea, constipation, chest pain, heart palpitations, shortness of breath, blood in stool, black tarry stool, urinary pain, urinary burning, urinary frequency, hematuria.   PAST MEDICAL HISTORY:  has a past medical history of COPD (chronic obstructive pulmonary disease); Nodule of left lung; Mass of lung; CHF (congestive heart failure); PONV (postoperative nausea and vomiting); and Adenocarcinoma of lung.     PAST SURGICAL HISTORY: Past Surgical History  Procedure Laterality Date  . Left video-assisted thoracic surgery, resection  08/24/10    Dr Edwyna Shell  . Back surgery       CURRENT MEDICATIONS:  Current  Outpatient Prescriptions  Medication Sig Dispense Refill  . Aclidinium Bromide (TUDORZA PRESSAIR) 400 MCG/ACT AEPB Inhale 400 mcg into the lungs 2 (two) times daily.  1 each  11  . albuterol (PROVENTIL) (2.5 MG/3ML) 0.083% nebulizer solution Take 2.5 mg by nebulization every 6 (six) hours as needed. Shortness of breath      . ALPRAZolam (XANAX) 0.5 MG tablet Take 0.5 mg by mouth daily.      . Fluticasone-Salmeterol (ADVAIR) 100-50 MCG/DOSE AEPB Inhale 1 puff into the lungs every 12 (twelve) hours.      . gabapentin (NEURONTIN) 600 MG tablet 2 (two) times daily.       Marland Kitchen ipratropium-albuterol (DUONEB) 0.5-2.5 (3) MG/3ML SOLN Take 3 mLs by nebulization 2 (two) times daily.  180 mL  2  . metoprolol (LOPRESSOR) 50 MG tablet TAKE 1 TABLET TWICE A DAY  60 tablet  0  . theophylline (THEODUR) 200 MG 12 hr tablet TAKE 1 TABLET TWICE A DAY  60 tablet  0  . benzonatate (TESSALON) 200 MG capsule Take 1 capsule (200 mg total) by mouth 3 (three) times daily.  21 capsule  0  . chlorpheniramine-HYDROcodone (TUSSIONEX PENNKINETIC ER) 10-8 MG/5ML LQCR Take 5 mLs by mouth every 12 (twelve) hours as needed.  140 mL  0  . ondansetron (ZOFRAN) 4 MG tablet Take 4 mg by mouth 3 (three) times daily.      . Oxycodone HCl 20 MG TABS Take 1 tablet (20 mg total) by mouth every 4 (four) hours as needed.  42 tablet  0   No current facility-administered medications for this visit.  ALLERGIES: Review of patient's allergies indicates no known allergies.   SOCIAL HISTORY:  reports that he quit smoking about 2 years ago. His smoking use included Cigarettes. He smoked 0.00 packs per day. He has never used smokeless tobacco. He reports that he does not drink alcohol or use illicit drugs.   FAMILY HISTORY: family history is not on file.    REVIEW OF SYSTEMS:  As mentioned in the history of present illness    PHYSICAL EXAM:  height is 6\' 4"  (1.93 m) and weight is 151 lb 4.8 oz (68.629 kg). His oral temperature is 97.6 F  (36.4 C). His blood pressure is 129/74 and his pulse is 92. His respiration is 18 and oxygen saturation is 97%.    GENERAL:alert, no distress, well nourished and well developed SKIN: no rashes or significant lesions HEAD: Normocephalic EYES: PERRLA, EOMI, Conjunctiva are pink and non-injected, sclera clear EARS: External ears normal OROPHARYNX:no erythema, lips, buccal mucosa, and tongue normal and mucous membranes are moist  NECK: supple, no adenopathy, no JVD, no stridor, non-tender LYMPH:  no palpable lymphadenopathy, no hepatosplenomegaly BREAST:breasts appear normal, no suspicious masses, no skin or nipple changes or axillary nodes LUNGS: clear to auscultation , coarse sounds heard HEART: regular rate & rhythm ABDOMEN:abdomen soft, obese and normal bowel sounds BACK: Back symmetric, no curvature. EXTREMITIES:no edema, no clubbing and no cyanosis  NEURO: alert & oriented x 3 with fluent speech, no focal motor/sensory deficits, gait normal    LABORATORY DATA:  Office Visit on 02/19/2013  Component Date Value Range Status  . FIO2 02/19/2013 0.21   Final  . Delivery systems 02/19/2013 ROOM AIR   Final  . pH, Arterial 02/19/2013 7.429  7.350 - 7.450 Final  . pCO2 arterial 02/19/2013 35.6  35.0 - 45.0 mmHg Final  . pO2, Arterial 02/19/2013 90.1  80.0 - 100.0 mmHg Final  . Bicarbonate 02/19/2013 23.2  20.0 - 24.0 mEq/L Final  . TCO2 02/19/2013 20.3  0 - 100 mmol/L Final  . Acid-base deficit 02/19/2013 0.6  0.0 - 2.0 mmol/L Final  . O2 Saturation 02/19/2013 97.3   Final  . Patient temperature 02/19/2013 37.0   Final  . Collection site 02/19/2013 RIGHT BRACHIAL   Final  . Drawn by 02/19/2013 [id number]   Final  . Sample type 02/19/2013 ARTERIAL DRAW   Final  . Total hemoglobin 02/19/2013 13.9  13.5 - 18.0 g/dL Final  . O2 Saturation 02/19/2013 97.2   Final  . Carboxyhemoglobin 02/19/2013 1.2  0.5 - 1.5 % Final  . Methemoglobin 02/19/2013 0.9  0.0 - 1.5 % Final  . Total oxygen content  02/19/2013 18.7  15.0 - 23.0 mL/dL Final  Office Visit on 02/09/2013  Component Date Value Range Status  . WBC 02/09/2013 13.6* 4.6 - 10.2 K/uL Final  . Lymph, poc 02/09/2013 1.9  0.6 - 3.4 Final  . POC LYMPH PERCENT 02/09/2013 13.7  10 - 50 %L Final  . POC Granulocyte 02/09/2013 11.2* 2 - 6.9 Final  . Granulocyte percent 02/09/2013 82.7* 37 - 80 %G Final  . RBC 02/09/2013 5.3  4.69 - 6.13 M/uL Final  . Hemoglobin 02/09/2013 14.7  14.1 - 18.1 g/dL Final  . HCT, POC 34/74/2595 44.1  43.5 - 53.7 % Final  . MCV 02/09/2013 83.1  80 - 97 fL Final  . MCH, POC 02/09/2013 27.8  27 - 31.2 pg Final  . MCHC 02/09/2013 33.4  31.8 - 35.4 g/dL Final  . RDW, POC 63/87/5643 14.7  Final  . Platelet Count, POC 02/09/2013 146.0  142 - 424 K/uL Final  . MPV 02/09/2013 7.7  0 - 99.8 fL Final  . Glucose 02/09/2013 94  65 - 99 mg/dL Final  . BUN 16/01/9603 24  8 - 27 mg/dL Final  . Creatinine, Ser 02/09/2013 0.88  0.76 - 1.27 mg/dL Final  . GFR calc non Af Amer 02/09/2013 88  >59 mL/min/1.73 Final  . GFR calc Af Amer 02/09/2013 102  >59 mL/min/1.73 Final  . BUN/Creatinine Ratio 02/09/2013 27* 10 - 22 Final  . Sodium 02/09/2013 146* 134 - 144 mmol/L Final  . Potassium 02/09/2013 4.7  3.5 - 5.2 mmol/L Final  . Chloride 02/09/2013 104  97 - 108 mmol/L Final  . CO2 02/09/2013 22  18 - 29 mmol/L Final  . Calcium 02/09/2013 9.1  8.6 - 10.2 mg/dL Final  . Total Protein 02/09/2013 6.5  6.0 - 8.5 g/dL Final  . Albumin 54/12/8117 4.6  3.6 - 4.8 g/dL Final  . Globulin, Total 02/09/2013 1.9  1.5 - 4.5 g/dL Final  . Albumin/Globulin Ratio 02/09/2013 2.4  1.1 - 2.5 Final  . Total Bilirubin 02/09/2013 0.4  0.0 - 1.2 mg/dL Final  . Alkaline Phosphatase 02/09/2013 98  39 - 117 IU/L Final  . AST 02/09/2013 14  0 - 40 IU/L Final  . ALT 02/09/2013 6  0 - 44 IU/L Final  . Cholesterol, Total 02/09/2013 161  100 - 199 mg/dL Final  . Triglycerides 02/09/2013 60  0 - 149 mg/dL Final  . HDL 14/78/2956 59  >39 mg/dL Final    Comment: According to ATP-III Guidelines, HDL-C >59 mg/dL is considered a                          negative risk factor for CHD.  Marland Kitchen VLDL Cholesterol Cal 02/09/2013 12  5 - 40 mg/dL Final  . LDL Calculated 02/09/2013 90  0 - 99 mg/dL Final  . Chol/HDL Ratio 02/09/2013 2.7  0.0 - 5.0 ratio units Final   Comment:                                   T. Chol/HDL Ratio                                                                      Men  Women                                                        1/2 Avg.Risk  3.4    3.3                                                            Avg.Risk  5.0    4.4  2X Avg.Risk  9.6    7.1                                                         3X Avg.Risk 23.4   11.0  . TSH 02/09/2013 3.840  0.450 - 4.500 uIU/mL Final  . T4, Total 02/09/2013 7.4  4.5 - 12.0 ug/dL Final  . T3 Uptake Ratio 02/09/2013 25  24 - 39 % Final  . Free Thyroxine Index 02/09/2013 1.9  1.2 - 4.9 Final  . PSA 02/09/2013 1.9  0.0 - 4.0 ng/mL Final   Comment: Roche ECLIA methodology.                          According to the American Urological Association, Serum PSA should                          decrease and remain at undetectable levels after radical                          prostatectomy. The AUA defines biochemical recurrence as an initial                          PSA value 0.2 ng/mL or greater followed by a subsequent confirmatory                          PSA value 0.2 ng/mL or greater.                          Values obtained with different assay methods or kits cannot be used                          interchangeably. Results cannot be interpreted as absolute evidence                          of the presence or absence of malignant disease.  . PSA, Free 02/09/2013 0.34  N/A ng/mL Final   Roche ECLIA methodology.  . PSA, Free Pct 02/09/2013 17.9   Final   Comment: The table below lists the probability of prostate cancer for                           men with non-suspicious DRE results and total PSA between                          4 and 10 ng/mL, by patient age Damaris Schooner, JAMA 1998,                          [id number]).                                            % Free PSA       50-64 yr  65-75 yr                                            0.00-10.00%        56%             55%                                           10.01-15.00%        24%             35%                                           15.01-20.00%        17%             23%                                           20.01-25.00%        10%             20%                                                >25.00%         5%              9%                          Please note:  Catalona et al did not make specific                                        recommendations regarding the use of                                        percent free PSA for any other population                                        of men.  . Theophylline Lvl 02/09/2013 <0.9* 10.0 - 20.0 ug/mL Final   Comment:                                Neonatal:                                                         Therapeutic  5.0 - 10.0                                                          Detection Limit =  0.9                                                    <0.9 indicates None Detected    Urinalysis    Component Value Date/Time   COLORURINE YELLOW 02/21/2012 1644   APPEARANCEUR CLOUDY* 02/21/2012 1644   LABSPEC 1.024 02/21/2012 1644   PHURINE 5.5 02/21/2012 1644   GLUCOSEU NEGATIVE 02/21/2012 1644   HGBUR NEGATIVE 02/21/2012 1644   BILIRUBINUR SMALL* 02/21/2012 1644   KETONESUR 40* 02/21/2012 1644   PROTEINUR 30* 02/21/2012 1644   UROBILINOGEN 1.0 02/21/2012 1644   NITRITE NEGATIVE 02/21/2012 1644   LEUKOCYTESUR NEGATIVE 02/21/2012 1644      IMPRESSION:   #1. Elevated hemoglobin and hematocrit most likely secondary due to COPD however underlying primary  polycythemia cannot be excluded here: We'll repeat the hemoglobin and hematocrit and perform erythropoietin level, LDH, Jak2 mutation and carboxyhemoglobin and ABGs. We'll also order ultrasound of the abdomen and to assess for splenomegaly. On review of literature Theophylline can reduce the hemoglobin and hematocrit levels. We'll check with primary physician's office whether the dose of Theophylline has been reduced recently.  #2 leukocytosis-rule out reactive versus primary (MPD/CML)etiologies:  Jak2 mutation, LDH , FISH testing for BCR abl, smear review and  ANA tests were ordered  #3 Adeno cancer of the left lung, status post surgery in 2012: actively followed by surgery. We have  #4 History of COPD  PLAN:  CBC and diff, erythropoietin level, LDH, Jak2 mutation and carboxyhemoglobin and ABGs Ultrasound of the abdomen Peripheral blood flow cytometry and FISH testing RTC 2 weeks   The patient voices understanding of current disease status and treatment options and is in agreement with the current care plan.  All questions were answered. The patient knows to call the clinic with any problems, questions or concerns. We can certainly see the patient much sooner if necessary.  Thank you so much for allowing me to participate in the care of [employer]. I will continue to follow up the patient with you and assist in his care.  Total time spent : 45 minutes   Annamarie Dawley, MD 02/19/2013 4:55 PM

## 2013-02-19 NOTE — Patient Instructions (Addendum)
[facility] Discharge Instructions  RECOMMENDATIONS MADE BY THE CONSULTANT AND ANY TEST RESULTS WILL BE SENT TO YOUR REFERRING PHYSICIAN.  EXAM FINDINGS BY THE PHYSICIAN TODAY AND SIGNS OR SYMPTOMS TO REPORT TO CLINIC OR PRIMARY PHYSICIAN: Exam and findings as discussed by Dr. Lajuana Ripple.  Will check some blood work today and will get an ultrasound of your abdomen to check your spleen.     MEDICATIONS PRESCRIBED:  none  INSTRUCTIONS/FOLLOW-UP: Ultrasound of your abdomen will be done on 11/18 at [facility] Radiology Department.  You need to arrive at 8:45am.  Do not eat or drink anything after midnight the night before. Follow-up in 2 weeks on 03/10/13 at 2:30pm  Thank you for choosing [facility] to provide your oncology and hematology care.  To afford each patient quality time with our providers, please arrive at least 15 minutes before your scheduled appointment time.  With your help, our goal is to use those 15 minutes to complete the necessary work-up to ensure our physicians have the information they need to help with your evaluation and healthcare recommendations.    Effective January 1st, 2014, we ask that you re-schedule your appointment with our physicians should you arrive 10 or more minutes late for your appointment.  We strive to give you quality time with our providers, and arriving late affects you and other patients whose appointments are after yours.    Again, thank you for choosing [facility].  Our hope is that these requests will decrease the amount of time that you wait before being seen by our physicians.       _____________________________________________________________  Should you have questions after your visit to [facility], please contact our office at [phone] between the hours of 8:30 a.m. and 5:00 p.m.  Voicemails left after 4:30 p.m. will not be returned until the following business day.  For  prescription refill requests, have your pharmacy contact our office with your prescription refill request.     Polycythemia Vera  Polycythemia Dwana Curd is a condition in which the body makes too many red blood cells and there is no known cause. The red blood cells (erythrocytes) are the cells which carry the oxygen in your blood stream to the cells of your body. Because of the increased red blood cells, the blood becomes thicker and does not circulate as well. It would be similar to your car having oil which is too thick so it cannot start and circulate as well. When the blood is too thick it often causes headaches and dizziness. It may also cause blood clots. Even though the blood clots easier, these patients bleed easier. The bleeding is caused because the blood cells which help stop bleeding (platelets) do not function normally. It occurs in all age groups but is more common in the 65 to 38 year age range. TREATMENT  The treatment of polycythemia vera for many years has been blood removal (phlebotomy) which is similar to blood removal in a blood bank, however this blood is not used for donation. Hydroxyurea is used to supplement phlebotomy. Aspirin is commonly given to thin the blood as long as the patient does not have a problem with bleeding. Other drugs are used based on the progression of the disease. Document Released: 12/19/2000 Document Revised: 06/18/2011 Document Reviewed: 06/25/2008 [facility] Patient Information 2014 [place], [place].

## 2013-02-19 NOTE — Progress Notes (Signed)
Stephen Fitzpatrick presented for [employer]. Labs per MD order drawn via Peripheral Line 23 gauge needle inserted in left AC  Good blood return present. Procedure without incident.  Needle removed intact. Patient tolerated procedure well.

## 2013-02-23 LAB — ANA: Anti Nuclear Antibody(ANA): NEGATIVE

## 2013-02-24 ENCOUNTER — Ambulatory Visit (HOSPITAL_COMMUNITY)
Admission: RE | Admit: 2013-02-24 | Discharge: 2013-02-24 | Disposition: A | Discharge status: Home / Self Care | Payer: PRIVATE HEALTH INSURANCE | Source: Ambulatory Visit | Attending: Hematology and Oncology | Admitting: Hematology and Oncology

## 2013-02-24 ENCOUNTER — Other Ambulatory Visit (HOSPITAL_COMMUNITY): Payer: Self-pay | Admitting: Hematology and Oncology

## 2013-02-24 DIAGNOSIS — D751 Secondary polycythemia: Secondary | ICD-10-CM

## 2013-02-24 DIAGNOSIS — D45 Polycythemia vera: Secondary | ICD-10-CM | POA: Insufficient documentation

## 2013-02-24 DIAGNOSIS — J449 Chronic obstructive pulmonary disease, unspecified: Secondary | ICD-10-CM

## 2013-03-10 ENCOUNTER — Ambulatory Visit (HOSPITAL_COMMUNITY): Payer: Medicare Other

## 2013-03-11 NOTE — Progress Notes (Signed)
This encounter was created in error - please disregard.

## 2013-03-13 ENCOUNTER — Encounter (HOSPITAL_COMMUNITY): Payer: Self-pay

## 2013-04-03 ENCOUNTER — Other Ambulatory Visit: Payer: Self-pay | Admitting: Nurse Practitioner

## 2013-05-14 ENCOUNTER — Ambulatory Visit: Payer: Medicare Other | Admitting: Family Medicine

## 2013-06-21 DIAGNOSIS — I10 Essential (primary) hypertension: Secondary | ICD-10-CM | POA: Insufficient documentation

## 2013-06-23 DIAGNOSIS — K7689 Other specified diseases of liver: Secondary | ICD-10-CM | POA: Insufficient documentation

## 2013-06-27 DIAGNOSIS — F32A Depression, unspecified: Secondary | ICD-10-CM | POA: Insufficient documentation

## 2013-08-18 ENCOUNTER — Other Ambulatory Visit: Payer: Self-pay

## 2013-08-18 NOTE — Telephone Encounter (Signed)
Last seen 02/09/13  B [place]

## 2013-08-19 MED ORDER — THEOPHYLLINE ER 200 MG PO TB12: 200.0000 mg | ORAL_TABLET | Freq: Two times a day (BID) | ORAL | Status: DC

## 2013-08-20 ENCOUNTER — Other Ambulatory Visit: Payer: Self-pay

## 2013-09-30 ENCOUNTER — Other Ambulatory Visit: Payer: Self-pay | Admitting: Family Medicine

## 2013-10-17 ENCOUNTER — Other Ambulatory Visit: Payer: Self-pay | Admitting: Family Medicine

## 2013-11-17 ENCOUNTER — Other Ambulatory Visit: Payer: Self-pay | Admitting: Family Medicine

## 2014-01-14 ENCOUNTER — Other Ambulatory Visit: Payer: Self-pay | Admitting: Family Medicine

## 2015-03-26 ENCOUNTER — Emergency Department (HOSPITAL_COMMUNITY)
Admission: EM | Admit: 2015-03-26 | Discharge: 2015-03-26 | Disposition: A | Discharge status: Home / Self Care | Payer: Commercial Managed Care - HMO | Attending: Emergency Medicine | Admitting: Emergency Medicine

## 2015-03-26 ENCOUNTER — Encounter (HOSPITAL_COMMUNITY): Payer: Self-pay | Admitting: *Deleted

## 2015-03-26 ENCOUNTER — Emergency Department (HOSPITAL_COMMUNITY): Payer: Commercial Managed Care - HMO

## 2015-03-26 DIAGNOSIS — J3489 Other specified disorders of nose and nasal sinuses: Secondary | ICD-10-CM | POA: Diagnosis not present

## 2015-03-26 DIAGNOSIS — J441 Chronic obstructive pulmonary disease with (acute) exacerbation: Secondary | ICD-10-CM | POA: Diagnosis not present

## 2015-03-26 DIAGNOSIS — Z87891 Personal history of nicotine dependence: Secondary | ICD-10-CM | POA: Diagnosis not present

## 2015-03-26 DIAGNOSIS — Z85118 Personal history of other malignant neoplasm of bronchus and lung: Secondary | ICD-10-CM | POA: Diagnosis not present

## 2015-03-26 DIAGNOSIS — I509 Heart failure, unspecified: Secondary | ICD-10-CM | POA: Insufficient documentation

## 2015-03-26 DIAGNOSIS — Z8709 Personal history of other diseases of the respiratory system: Secondary | ICD-10-CM | POA: Diagnosis not present

## 2015-03-26 DIAGNOSIS — Z79899 Other long term (current) drug therapy: Secondary | ICD-10-CM | POA: Insufficient documentation

## 2015-03-26 DIAGNOSIS — R05 Cough: Secondary | ICD-10-CM | POA: Diagnosis not present

## 2015-03-26 DIAGNOSIS — R0602 Shortness of breath: Secondary | ICD-10-CM | POA: Diagnosis not present

## 2015-03-26 DIAGNOSIS — R509 Fever, unspecified: Secondary | ICD-10-CM | POA: Diagnosis not present

## 2015-03-26 LAB — BASIC METABOLIC PANEL
ANION GAP: 9 (ref 5–15)
BUN: 15 mg/dL (ref 6–20)
CALCIUM: 8.1 mg/dL — AB (ref 8.9–10.3)
CO2: 28 mmol/L (ref 22–32)
CREATININE: 0.86 mg/dL (ref 0.61–1.24)
Chloride: 101 mmol/L (ref 101–111)
Glucose, Bld: 96 mg/dL (ref 65–99)
Potassium: 4.2 mmol/L (ref 3.5–5.1)
SODIUM: 138 mmol/L (ref 135–145)

## 2015-03-26 LAB — CBC WITH DIFFERENTIAL/PLATELET
BASOS ABS: 0 10*3/uL (ref 0.0–0.1)
BASOS PCT: 0 %
EOS ABS: 0.4 10*3/uL (ref 0.0–0.7)
Eosinophils Relative: 5 %
HCT: 48.2 % (ref 39.0–52.0)
HEMOGLOBIN: 15.6 g/dL (ref 13.0–17.0)
Lymphocytes Relative: 8 %
Lymphs Abs: 0.7 10*3/uL (ref 0.7–4.0)
MCH: 28.4 pg (ref 26.0–34.0)
MCHC: 32.4 g/dL (ref 30.0–36.0)
MCV: 87.6 fL (ref 78.0–100.0)
MONOS PCT: 15 %
Monocytes Absolute: 1.2 10*3/uL — ABNORMAL HIGH (ref 0.1–1.0)
NEUTROS ABS: 5.5 10*3/uL (ref 1.7–7.7)
NEUTROS PCT: 72 %
Platelets: 114 10*3/uL — ABNORMAL LOW (ref 150–400)
RBC: 5.5 MIL/uL (ref 4.22–5.81)
RDW: 14.7 % (ref 11.5–15.5)
WBC: 7.7 10*3/uL (ref 4.0–10.5)

## 2015-03-26 MED ORDER — PREDNISONE 20 MG PO TABS
40.0000 mg | ORAL_TABLET | Freq: Once | ORAL | Status: AC
  Administered 2015-03-26: 40 mg via ORAL
  Filled 2015-03-26: qty 2

## 2015-03-26 MED ORDER — IPRATROPIUM-ALBUTEROL 0.5-2.5 (3) MG/3ML IN SOLN
3.0000 mL | Freq: Once | RESPIRATORY_TRACT | Status: AC
  Administered 2015-03-26: 3 mL via RESPIRATORY_TRACT
  Filled 2015-03-26: qty 3

## 2015-03-26 MED ORDER — AZITHROMYCIN 250 MG PO TABS: 250.0000 mg | ORAL_TABLET | Freq: Every day | ORAL | Status: DC

## 2015-03-26 MED ORDER — DIPHENHYDRAMINE HCL 25 MG PO CAPS
25.0000 mg | ORAL_CAPSULE | Freq: Once | ORAL | Status: AC
  Administered 2015-03-26: 25 mg via ORAL
  Filled 2015-03-26: qty 1

## 2015-03-26 MED ORDER — AZITHROMYCIN 250 MG PO TABS
500.0000 mg | ORAL_TABLET | Freq: Once | ORAL | Status: AC
  Administered 2015-03-26: 500 mg via ORAL
  Filled 2015-03-26: qty 2

## 2015-03-26 MED ORDER — ALBUTEROL SULFATE (2.5 MG/3ML) 0.083% IN NEBU: 2.5000 mg | INHALATION_SOLUTION | Freq: Four times a day (QID) | RESPIRATORY_TRACT | Status: DC | PRN

## 2015-03-26 MED ORDER — MORPHINE SULFATE (PF) 4 MG/ML IV SOLN
4.0000 mg | Freq: Once | INTRAVENOUS | Status: AC
  Administered 2015-03-26: 4 mg via INTRAMUSCULAR
  Filled 2015-03-26: qty 1

## 2015-03-26 MED ORDER — PREDNISONE 20 MG PO TABS: 40.0000 mg | ORAL_TABLET | Freq: Every day | ORAL | Status: DC

## 2015-03-26 MED ORDER — ALBUTEROL SULFATE (2.5 MG/3ML) 0.083% IN NEBU
2.5000 mg | INHALATION_SOLUTION | Freq: Once | RESPIRATORY_TRACT | Status: AC
  Administered 2015-03-26: 2.5 mg via RESPIRATORY_TRACT
  Filled 2015-03-26: qty 3

## 2015-03-26 NOTE — ED Notes (Signed)
Pt c/o cough for past 2 days and productive of yellow phlegm

## 2015-03-26 NOTE — ED Provider Notes (Signed)
CSN: [id number]     Arrival date & time 03/26/15  1555 History   First MD Initiated Contact with Patient 03/26/15 1620     Chief Complaint  Patient presents with  . Cough     (Consider location/radiation/quality/duration/timing/severity/associated sxs/prior Treatment) HPI   Stephen Fitzpatrick is a 69 y.o. male with h/o COPD and partial lung resection secondary to lung CA, who presents to the Emergency Department complaining of cough and nasal congestion for 2 days. He states the cough has been productive of thick yellow sputum and worse at night.  He states that he has also been short of breath with exertion and excessive coughing, and having excessive nasal congestion and runny nose.  He has been using his inhalers with moderate relief.  He denies chest pain, fever, abd pain, bloody sputum, chills, or shortness of breath at rest.      Past Medical History  Diagnosis Date  . COPD (chronic obstructive pulmonary disease) ([place])   . Nodule of left lung   . Mass of lung   . CHF (congestive heart failure) ([place])   . PONV (postoperative nausea and vomiting)   . Adenocarcinoma of lung [facility])    Past Surgical History  Procedure Laterality Date  . Left video-assisted thoracic surgery, resection  08/24/10    Dr Arlyce Dice  . Back surgery     History reviewed. No pertinent family history. Social History  Substance Use Topics  . Smoking status: Former Smoker    Types: Cigarettes    Quit date: 08/24/2010  . Smokeless tobacco: Never Used  . Alcohol Use: No    Review of Systems  Constitutional: Negative for fever, chills and appetite change.  HENT: Positive for congestion. Negative for sore throat and trouble swallowing.   Respiratory: Positive for cough, chest tightness, shortness of breath and wheezing.   Cardiovascular: Negative for chest pain.  Gastrointestinal: Negative for nausea, vomiting and abdominal pain.  Genitourinary: Negative for dysuria.  Musculoskeletal: Negative for  arthralgias, neck pain and neck stiffness.  Skin: Negative for rash.  Neurological: Positive for headaches. Negative for dizziness, weakness and numbness.  Hematological: Negative for adenopathy.  All other systems reviewed and are negative.     Allergies  Review of patient's allergies indicates no known allergies.  Home Medications   Prior to Admission medications   Medication Sig Start Date End Date Taking? Authorizing Provider  Aclidinium Bromide (TUDORZA PRESSAIR) 400 MCG/ACT AEPB Inhale 400 mcg into the lungs 2 (two) times daily. 02/09/13   Lysbeth Penner, FNP  albuterol (PROVENTIL) (2.5 MG/3ML) 0.083% nebulizer solution Take 2.5 mg by nebulization every 6 (six) hours as needed. Shortness of breath    Historical Provider, MD  ALPRAZolam (XANAX) 0.5 MG tablet Take 0.5 mg by mouth daily.    Historical Provider, MD  benzonatate (TESSALON) 200 MG capsule Take 1 capsule (200 mg total) by mouth 3 (three) times daily. 02/28/12   Monika Salk, MD  chlorpheniramine-HYDROcodone (TUSSIONEX PENNKINETIC ER) 10-8 MG/5ML LQCR Take 5 mLs by mouth every 12 (twelve) hours as needed. 09/01/11   Lajean Saver, MD  Fluticasone-Salmeterol (ADVAIR) 100-50 MCG/DOSE AEPB Inhale 1 puff into the lungs every 12 (twelve) hours.    Historical Provider, MD  gabapentin (NEURONTIN) 600 MG tablet 2 (two) times daily.  02/01/13   Historical Provider, MD  ipratropium-albuterol (DUONEB) 0.5-2.5 (3) MG/3ML SOLN Take 3 mLs by nebulization 2 (two) times daily. 07/16/12   Chipper Herb, MD  metoprolol (LOPRESSOR) 50 MG tablet TAKE  1 TABLET TWICE A DAY 11/22/12   Mary-Margaret Hassell Done, FNP  ondansetron (ZOFRAN) 4 MG tablet Take 4 mg by mouth 3 (three) times daily.    Historical Provider, MD  Oxycodone HCl 20 MG TABS Take 1 tablet (20 mg total) by mouth every 4 (four) hours as needed. 02/28/12   Monika Salk, MD  theophylline (THEODUR) 200 MG 12 hr tablet Take 1 tablet (200 mg total) by mouth 2 (two) times daily.    Orson Ape  [place], FNP   BP 164/92 mmHg  Pulse 101  Temp(Src) 97.9 F (36.6 C) (Oral)  Resp 24  Ht '6\' 3"'$  (1.905 m)  Wt 74.844 kg  BMI 20.62 kg/m2  SpO2 95% Physical Exam  Constitutional: He is oriented to person, place, and time. He appears well-developed and well-nourished. No distress.  HENT:  Head: Normocephalic and atraumatic.  Right Ear: Tympanic membrane and ear canal normal.  Left Ear: Tympanic membrane and ear canal normal.  Nose: Mucosal edema and rhinorrhea present.  Mouth/Throat: Uvula is midline, oropharynx is clear and moist and mucous membranes are normal. No oropharyngeal exudate.  Eyes: EOM are normal. Pupils are equal, round, and reactive to light.  Neck: Normal range of motion, full passive range of motion without pain and phonation normal. Neck supple. No JVD present. No tracheal deviation present.  Cardiovascular: Normal rate, regular rhythm and intact distal pulses.   No murmur heard. Pulmonary/Chest: Effort normal. No respiratory distress. He has no rales. He exhibits no tenderness.  Coarse lungs sounds bilaterally with inspiratory and expiratory wheezes bilaterally.  No rales.  Abdominal: Soft. He exhibits no distension. There is no tenderness.  Musculoskeletal: Normal range of motion. He exhibits no edema.  Lymphadenopathy:    He has no cervical adenopathy.  Neurological: He is alert and oriented to person, place, and time. He exhibits normal muscle tone. Coordination normal.  Skin: Skin is warm and dry.  Psychiatric: He has a normal mood and affect.  Nursing note and vitals reviewed.   ED Course  Procedures (including critical care time) Labs Review Labs Reviewed  BASIC METABOLIC PANEL - Abnormal; Notable for the following:    Calcium 8.1 (*)    All other components within normal limits  CBC WITH DIFFERENTIAL/PLATELET - Abnormal; Notable for the following:    Platelets 114 (*)    Monocytes Absolute 1.2 (*)    All other components within normal limits     Imaging Review Dg Chest 2 View  03/26/2015  CLINICAL DATA:  69 year old male with cough for 2 days with wheezing and shortness of breath and fever. EXAM: CHEST  2 VIEW COMPARISON:  02/25/2012 CT and prior studies. FINDINGS: Mild cardiomegaly and COPD/ emphysema again identified. Pulmonary nodular opacities are unchanged. Scarring within the left upper lung again noted. There is no evidence of focal airspace disease, pulmonary edema, pleural effusion, or pneumothorax. No acute bony abnormalities are identified. IMPRESSION: No evidence of acute cardiopulmonary disease. Cardiomegaly and COPD/ emphysema. Stable pulmonary opacities. Electronically Signed   By: Margarette Canada M.D.   On: 03/26/2015 17:02   I have personally reviewed and evaluated these images and lab results as part of my medical decision-making.    MDM   Final diagnoses:  COPD exacerbation ([place])   Pt is feeling better, lung sounds improved, he has ambulated in the dept w/o difficulty, no hypoxia upon exertion.  Labs and CRX reviewed and pt appears stable for d/c.  He agrees to continue regular use of his inhalers  and neb tx's.  I have advised close PMD f/u next week and given strict return precautions.  Pt agrees to plan and verb understanding.  rx for steroid and zithromax.    Kem Parkinson, PA-C 03/27/15 [place], DO 03/28/15 0001

## 2015-03-26 NOTE — ED Notes (Signed)
Pt tolerated ambulation well o2 stat maintained at 96.

## 2015-03-26 NOTE — Discharge Instructions (Signed)

## 2015-05-06 DIAGNOSIS — I10 Essential (primary) hypertension: Secondary | ICD-10-CM | POA: Diagnosis not present

## 2015-05-06 DIAGNOSIS — J441 Chronic obstructive pulmonary disease with (acute) exacerbation: Secondary | ICD-10-CM | POA: Diagnosis not present

## 2015-05-06 DIAGNOSIS — C3492 Malignant neoplasm of unspecified part of left bronchus or lung: Secondary | ICD-10-CM | POA: Diagnosis not present

## 2015-05-06 DIAGNOSIS — Z9981 Dependence on supplemental oxygen: Secondary | ICD-10-CM | POA: Diagnosis not present

## 2015-05-06 DIAGNOSIS — Z87891 Personal history of nicotine dependence: Secondary | ICD-10-CM | POA: Diagnosis not present

## 2015-05-06 DIAGNOSIS — R0602 Shortness of breath: Secondary | ICD-10-CM | POA: Diagnosis not present

## 2015-05-07 DIAGNOSIS — R0602 Shortness of breath: Secondary | ICD-10-CM | POA: Diagnosis not present

## 2015-05-16 DIAGNOSIS — Z136 Encounter for screening for cardiovascular disorders: Secondary | ICD-10-CM | POA: Diagnosis not present

## 2015-05-16 DIAGNOSIS — Z85118 Personal history of other malignant neoplasm of bronchus and lung: Secondary | ICD-10-CM | POA: Diagnosis not present

## 2015-05-16 DIAGNOSIS — Z125 Encounter for screening for malignant neoplasm of prostate: Secondary | ICD-10-CM | POA: Diagnosis not present

## 2015-05-16 DIAGNOSIS — J449 Chronic obstructive pulmonary disease, unspecified: Secondary | ICD-10-CM | POA: Diagnosis not present

## 2015-05-20 DIAGNOSIS — J449 Chronic obstructive pulmonary disease, unspecified: Secondary | ICD-10-CM | POA: Diagnosis not present

## 2015-05-21 DIAGNOSIS — R0602 Shortness of breath: Secondary | ICD-10-CM | POA: Diagnosis not present

## 2015-05-21 DIAGNOSIS — Z87891 Personal history of nicotine dependence: Secondary | ICD-10-CM | POA: Diagnosis not present

## 2015-05-21 DIAGNOSIS — J439 Emphysema, unspecified: Secondary | ICD-10-CM | POA: Diagnosis not present

## 2015-05-21 DIAGNOSIS — Z79899 Other long term (current) drug therapy: Secondary | ICD-10-CM | POA: Diagnosis not present

## 2015-05-21 DIAGNOSIS — J441 Chronic obstructive pulmonary disease with (acute) exacerbation: Secondary | ICD-10-CM | POA: Diagnosis not present

## 2015-05-26 DIAGNOSIS — G8929 Other chronic pain: Secondary | ICD-10-CM | POA: Diagnosis not present

## 2015-05-26 DIAGNOSIS — R918 Other nonspecific abnormal finding of lung field: Secondary | ICD-10-CM | POA: Diagnosis not present

## 2015-05-26 DIAGNOSIS — M545 Low back pain: Secondary | ICD-10-CM | POA: Diagnosis not present

## 2015-05-26 DIAGNOSIS — R972 Elevated prostate specific antigen [PSA]: Secondary | ICD-10-CM | POA: Diagnosis not present

## 2015-05-26 DIAGNOSIS — J449 Chronic obstructive pulmonary disease, unspecified: Secondary | ICD-10-CM | POA: Diagnosis not present

## 2015-05-26 DIAGNOSIS — Z85118 Personal history of other malignant neoplasm of bronchus and lung: Secondary | ICD-10-CM | POA: Diagnosis not present

## 2015-05-30 DIAGNOSIS — J449 Chronic obstructive pulmonary disease, unspecified: Secondary | ICD-10-CM | POA: Diagnosis not present

## 2015-06-07 DIAGNOSIS — K4021 Bilateral inguinal hernia, without obstruction or gangrene, recurrent: Secondary | ICD-10-CM | POA: Diagnosis not present

## 2015-06-07 DIAGNOSIS — C3492 Malignant neoplasm of unspecified part of left bronchus or lung: Secondary | ICD-10-CM | POA: Diagnosis not present

## 2015-06-07 DIAGNOSIS — J449 Chronic obstructive pulmonary disease, unspecified: Secondary | ICD-10-CM | POA: Diagnosis not present

## 2015-06-10 DIAGNOSIS — C3492 Malignant neoplasm of unspecified part of left bronchus or lung: Secondary | ICD-10-CM | POA: Diagnosis not present

## 2015-06-10 DIAGNOSIS — J439 Emphysema, unspecified: Secondary | ICD-10-CM | POA: Diagnosis not present

## 2015-06-10 DIAGNOSIS — C3490 Malignant neoplasm of unspecified part of unspecified bronchus or lung: Secondary | ICD-10-CM | POA: Diagnosis not present

## 2015-06-10 DIAGNOSIS — Z9889 Other specified postprocedural states: Secondary | ICD-10-CM | POA: Diagnosis not present

## 2015-06-14 DIAGNOSIS — Z79899 Other long term (current) drug therapy: Secondary | ICD-10-CM | POA: Diagnosis not present

## 2015-06-14 DIAGNOSIS — J449 Chronic obstructive pulmonary disease, unspecified: Secondary | ICD-10-CM | POA: Diagnosis not present

## 2015-06-27 DIAGNOSIS — G4709 Other insomnia: Secondary | ICD-10-CM | POA: Diagnosis not present

## 2015-06-27 DIAGNOSIS — J449 Chronic obstructive pulmonary disease, unspecified: Secondary | ICD-10-CM | POA: Diagnosis not present

## 2015-06-27 DIAGNOSIS — Z79899 Other long term (current) drug therapy: Secondary | ICD-10-CM | POA: Diagnosis not present

## 2015-06-27 DIAGNOSIS — R110 Nausea: Secondary | ICD-10-CM | POA: Diagnosis not present

## 2015-06-27 DIAGNOSIS — R12 Heartburn: Secondary | ICD-10-CM | POA: Diagnosis not present

## 2015-08-19 DIAGNOSIS — D2311 Other benign neoplasm of skin of right eyelid, including canthus: Secondary | ICD-10-CM | POA: Diagnosis not present

## 2015-08-19 DIAGNOSIS — H2513 Age-related nuclear cataract, bilateral: Secondary | ICD-10-CM | POA: Diagnosis not present

## 2015-08-19 DIAGNOSIS — H11441 Conjunctival cysts, right eye: Secondary | ICD-10-CM | POA: Diagnosis not present

## 2015-08-22 DIAGNOSIS — C3492 Malignant neoplasm of unspecified part of left bronchus or lung: Secondary | ICD-10-CM | POA: Diagnosis not present

## 2015-08-30 DIAGNOSIS — Z981 Arthrodesis status: Secondary | ICD-10-CM | POA: Diagnosis not present

## 2015-08-30 DIAGNOSIS — M5136 Other intervertebral disc degeneration, lumbar region: Secondary | ICD-10-CM | POA: Diagnosis not present

## 2015-08-30 DIAGNOSIS — Z79891 Long term (current) use of opiate analgesic: Secondary | ICD-10-CM | POA: Diagnosis not present

## 2015-09-29 DIAGNOSIS — Z981 Arthrodesis status: Secondary | ICD-10-CM | POA: Diagnosis not present

## 2015-09-29 DIAGNOSIS — M5136 Other intervertebral disc degeneration, lumbar region: Secondary | ICD-10-CM | POA: Diagnosis not present

## 2015-09-29 DIAGNOSIS — Z79891 Long term (current) use of opiate analgesic: Secondary | ICD-10-CM | POA: Diagnosis not present

## 2016-01-04 DIAGNOSIS — Z87891 Personal history of nicotine dependence: Secondary | ICD-10-CM | POA: Diagnosis not present

## 2016-01-04 DIAGNOSIS — R0602 Shortness of breath: Secondary | ICD-10-CM | POA: Diagnosis not present

## 2016-01-04 DIAGNOSIS — J441 Chronic obstructive pulmonary disease with (acute) exacerbation: Secondary | ICD-10-CM | POA: Diagnosis not present

## 2016-01-04 DIAGNOSIS — Z85118 Personal history of other malignant neoplasm of bronchus and lung: Secondary | ICD-10-CM | POA: Diagnosis not present

## 2016-01-04 DIAGNOSIS — Z9981 Dependence on supplemental oxygen: Secondary | ICD-10-CM | POA: Diagnosis not present

## 2016-03-29 DIAGNOSIS — J40 Bronchitis, not specified as acute or chronic: Secondary | ICD-10-CM | POA: Diagnosis not present

## 2016-04-11 DIAGNOSIS — J209 Acute bronchitis, unspecified: Secondary | ICD-10-CM | POA: Diagnosis not present

## 2017-10-04 ENCOUNTER — Emergency Department (HOSPITAL_COMMUNITY): Payer: Medicare HMO

## 2017-10-04 ENCOUNTER — Encounter (HOSPITAL_COMMUNITY): Payer: Self-pay

## 2017-10-04 ENCOUNTER — Other Ambulatory Visit: Payer: Self-pay

## 2017-10-04 ENCOUNTER — Emergency Department (HOSPITAL_COMMUNITY)
Admission: EM | Admit: 2017-10-04 | Discharge: 2017-10-04 | Disposition: A | Discharge status: Home / Self Care | Payer: Medicare HMO | Attending: Emergency Medicine | Admitting: Emergency Medicine

## 2017-10-04 DIAGNOSIS — S59902A Unspecified injury of left elbow, initial encounter: Secondary | ICD-10-CM | POA: Diagnosis not present

## 2017-10-04 DIAGNOSIS — J441 Chronic obstructive pulmonary disease with (acute) exacerbation: Secondary | ICD-10-CM | POA: Insufficient documentation

## 2017-10-04 DIAGNOSIS — S4992XA Unspecified injury of left shoulder and upper arm, initial encounter: Secondary | ICD-10-CM | POA: Diagnosis not present

## 2017-10-04 DIAGNOSIS — R05 Cough: Secondary | ICD-10-CM | POA: Diagnosis not present

## 2017-10-04 DIAGNOSIS — I509 Heart failure, unspecified: Secondary | ICD-10-CM | POA: Insufficient documentation

## 2017-10-04 DIAGNOSIS — R0602 Shortness of breath: Secondary | ICD-10-CM | POA: Diagnosis not present

## 2017-10-04 DIAGNOSIS — Z87891 Personal history of nicotine dependence: Secondary | ICD-10-CM | POA: Insufficient documentation

## 2017-10-04 DIAGNOSIS — Z79899 Other long term (current) drug therapy: Secondary | ICD-10-CM | POA: Diagnosis not present

## 2017-10-04 DIAGNOSIS — R110 Nausea: Secondary | ICD-10-CM | POA: Diagnosis not present

## 2017-10-04 DIAGNOSIS — F10929 Alcohol use, unspecified with intoxication, unspecified: Secondary | ICD-10-CM | POA: Diagnosis not present

## 2017-10-04 DIAGNOSIS — R1111 Vomiting without nausea: Secondary | ICD-10-CM | POA: Diagnosis not present

## 2017-10-04 DIAGNOSIS — M25522 Pain in left elbow: Secondary | ICD-10-CM | POA: Diagnosis not present

## 2017-10-04 DIAGNOSIS — M25512 Pain in left shoulder: Secondary | ICD-10-CM | POA: Diagnosis not present

## 2017-10-04 LAB — CBC WITH DIFFERENTIAL/PLATELET
BASOS ABS: 0.1 10*3/uL (ref 0.0–0.1)
Basophils Relative: 1 %
EOS PCT: 8 %
Eosinophils Absolute: 0.6 10*3/uL (ref 0.0–0.7)
HCT: 51.4 % (ref 39.0–52.0)
Hemoglobin: 16.4 g/dL (ref 13.0–17.0)
LYMPHS PCT: 15 %
Lymphs Abs: 1.3 10*3/uL (ref 0.7–4.0)
MCH: 29.4 pg (ref 26.0–34.0)
MCHC: 31.9 g/dL (ref 30.0–36.0)
MCV: 92.1 fL (ref 78.0–100.0)
MONO ABS: 0.9 10*3/uL (ref 0.1–1.0)
Monocytes Relative: 11 %
Neutro Abs: 5.5 10*3/uL (ref 1.7–7.7)
Neutrophils Relative %: 66 %
PLATELETS: 141 10*3/uL — AB (ref 150–400)
RBC: 5.58 MIL/uL (ref 4.22–5.81)
RDW: 14.8 % (ref 11.5–15.5)
WBC: 8.4 10*3/uL (ref 4.0–10.5)

## 2017-10-04 LAB — BASIC METABOLIC PANEL
ANION GAP: 10 (ref 5–15)
BUN: 10 mg/dL (ref 8–23)
CALCIUM: 8.1 mg/dL — AB (ref 8.9–10.3)
CO2: 28 mmol/L (ref 22–32)
Chloride: 103 mmol/L (ref 98–111)
Creatinine, Ser: 0.87 mg/dL (ref 0.61–1.24)
GFR calc Af Amer: >60 mL/min (ref >60)
GLUCOSE: 102 mg/dL — AB (ref 70–99)
Potassium: 4.2 mmol/L (ref 3.5–5.1)
Sodium: 141 mmol/L (ref 135–145)

## 2017-10-04 LAB — BRAIN NATRIURETIC PEPTIDE: B Natriuretic Peptide: 48 pg/mL (ref 0.0–100.0)

## 2017-10-04 LAB — I-STAT TROPONIN, ED: Troponin i, poc: 0 ng/mL (ref 0.00–0.08)

## 2017-10-04 MED ORDER — PREDNISONE 20 MG PO TABS: 60.0000 mg | ORAL_TABLET | Freq: Every day | ORAL | 0 refills | Status: DC

## 2017-10-04 MED ORDER — DOXYCYCLINE HYCLATE 100 MG PO CAPS
100.0000 mg | ORAL_CAPSULE | Freq: Two times a day (BID) | ORAL | 0 refills | Status: DC
Start: 2017-10-04 — End: 2021-10-13

## 2017-10-04 MED ORDER — METHYLPREDNISOLONE SODIUM SUCC 125 MG IJ SOLR
125.0000 mg | Freq: Once | INTRAMUSCULAR | Status: AC
  Administered 2017-10-04: 125 mg via INTRAVENOUS
  Filled 2017-10-04: qty 2

## 2017-10-04 MED ORDER — ALBUTEROL SULFATE (2.5 MG/3ML) 0.083% IN NEBU
2.5000 mg | INHALATION_SOLUTION | Freq: Once | RESPIRATORY_TRACT | Status: AC
  Administered 2017-10-04: 2.5 mg via RESPIRATORY_TRACT
  Filled 2017-10-04: qty 3

## 2017-10-04 MED ORDER — IPRATROPIUM-ALBUTEROL 0.5-2.5 (3) MG/3ML IN SOLN
3.0000 mL | Freq: Once | RESPIRATORY_TRACT | Status: AC
  Administered 2017-10-04: 3 mL via RESPIRATORY_TRACT
  Filled 2017-10-04: qty 30

## 2017-10-04 NOTE — ED Notes (Signed)
Patient resting at this time.

## 2017-10-04 NOTE — ED Provider Notes (Signed)
[facility] Provider Note   CSN: [id number] Arrival date & time: 10/04/17  0327     History   Chief Complaint Chief Complaint  Patient presents with  . Shortness of Breath    HPI Stephen Fitzpatrick is a 72 y.o. male.  Patient presents to the emergency department for evaluation of shortness of breath.  Patient reports that he has not been feeling well for nearly a year, however tonight he started having increased shortness of breath.  He is not experiencing any chest pain or swelling.  He does have a cough, mostly nonproductive.  No fever.     Past Medical History:  Diagnosis Date  . Adenocarcinoma of lung ([place])   . CHF (congestive heart failure) ([place])   . COPD (chronic obstructive pulmonary disease) ([place])   . Mass of lung   . Nodule of left lung   . PONV (postoperative nausea and vomiting)     Patient Active Problem List   Diagnosis Date Noted  . Respiratory failure, acute ([place]) 02/27/2012  . Shortness of breath 02/21/2012  . PNA (pneumonia) 02/21/2012  . Leukocytosis 02/21/2012  . Adenocarcinoma of Left Lower Lobe lung   . Nodule of left lower lung     Past Surgical History:  Procedure Laterality Date  . BACK SURGERY    . Left video-assisted thoracic surgery, resection  08/24/10   Dr Arlyce Dice        Home Medications    Prior to Admission medications   Medication Sig Start Date End Date Taking? Authorizing Provider  acetaminophen (TYLENOL) 325 MG tablet Take 650 mg by mouth every 6 (six) hours as needed for mild pain.    [provider]  Aclidinium Bromide (TUDORZA PRESSAIR) 400 MCG/ACT AEPB Inhale 400 mcg into the lungs 2 (two) times daily. 02/09/13   Lysbeth Penner, FNP  albuterol (PROVENTIL) (2.5 MG/3ML) 0.083% nebulizer solution Take 3 mLs (2.5 mg total) by nebulization every 6 (six) hours as needed for wheezing or shortness of breath. 03/26/15   Triplett, Tammy, PA-C  ALPRAZolam (XANAX) 0.5 MG tablet Take 0.5 mg by mouth  daily.    [provider]  azithromycin (ZITHROMAX) 250 MG tablet Take 1 tablet (250 mg total) by mouth daily. Take first 2 tablets together, then 1 every day until finished. 03/26/15   Triplett, Tammy, PA-C  benzonatate (TESSALON) 200 MG capsule Take 1 capsule (200 mg total) by mouth 3 (three) times daily. 02/28/12   Monika Salk, MD  budesonide (RHINOCORT ALLERGY) 32 MCG/ACT nasal spray Place 1 spray into both nostrils daily.    [provider]  chlorpheniramine-HYDROcodone (TUSSIONEX PENNKINETIC ER) 10-8 MG/5ML LQCR Take 5 mLs by mouth every 12 (twelve) hours as needed. 09/01/11   Lajean Saver, MD  Fluticasone-Salmeterol (ADVAIR) 100-50 MCG/DOSE AEPB Inhale 1 puff into the lungs every 12 (twelve) hours.    [provider]  gabapentin (NEURONTIN) 600 MG tablet Take 600 mg by mouth 2 (two) times daily.  02/01/13   [provider]  ipratropium-albuterol (DUONEB) 0.5-2.5 (3) MG/3ML SOLN Take 3 mLs by nebulization 2 (two) times daily. 07/16/12   Chipper Herb, MD  metoprolol (LOPRESSOR) 50 MG tablet TAKE 1 TABLET TWICE A DAY 11/22/12   Hassell Done, Mary-Margaret, FNP  oxycodone (ROXICODONE) 30 MG immediate release tablet Take 30 mg by mouth every 4 (four) hours as needed for pain.    [provider]  Oxycodone HCl 20 MG TABS Take 1 tablet (20 mg total) by  mouth every 4 (four) hours as needed. 02/28/12   Monika Salk, MD  predniSONE (DELTASONE) 20 MG tablet Take 2 tablets (40 mg total) by mouth daily. For 5 days 03/26/15   Triplett, Tammy, PA-C  theophylline (THEODUR) 200 MG 12 hr tablet Take 1 tablet (200 mg total) by mouth 2 (two) times daily.    Lysbeth Penner, FNP    Family History No family history on file.  Social History Social History   Tobacco Use  . Smoking status: Former Smoker    Types: Cigarettes    Last attempt to quit: 08/24/2010    Years since quitting: 7.1  . Smokeless tobacco: Never Used  Substance Use Topics  . Alcohol use: No  .  Drug use: No     Allergies   Patient has no known allergies.   Review of Systems Review of Systems  Respiratory: Positive for cough and shortness of breath.   All other systems reviewed and are negative.    Physical Exam Updated Vital Signs BP 107/79   Pulse 72   Temp (!) 97.4 F (36.3 C) (Oral)   Resp 18   Ht 6\' 4"  (1.93 m)   Wt 68 kg (150 lb)   SpO2 97%   BMI 18.26 kg/m   Physical Exam  Constitutional: He is oriented to person, place, and time. He appears well-developed and well-nourished. No distress.  HENT:  Head: Normocephalic and atraumatic.  Right Ear: Hearing normal.  Left Ear: Hearing normal.  Nose: Nose normal.  Mouth/Throat: Oropharynx is clear and moist and mucous membranes are normal.  Eyes: Pupils are equal, round, and reactive to light. Conjunctivae and EOM are normal.  Neck: Normal range of motion. Neck supple.  Cardiovascular: Regular rhythm, S1 normal and S2 normal. Exam reveals no gallop and no friction rub.  No murmur heard. Pulmonary/Chest: Effort normal. No respiratory distress. He has decreased breath sounds. He has wheezes. He exhibits no tenderness.  Abdominal: Soft. Normal appearance and bowel sounds are normal. There is no hepatosplenomegaly. There is no tenderness. There is no rebound, no guarding, no tenderness at McBurney's point and negative Murphy's sign. No hernia.  Musculoskeletal: Normal range of motion.  Neurological: He is alert and oriented to person, place, and time. He has normal strength. No cranial nerve deficit or sensory deficit. Coordination normal. GCS eye subscore is 4. GCS verbal subscore is 5. GCS motor subscore is 6.  Skin: Skin is warm, dry and intact. No rash noted. No cyanosis.  Psychiatric: He has a normal mood and affect. His speech is normal and behavior is normal. Thought content normal.  Nursing note and vitals reviewed.    ED Treatments / Results  Labs (all labs ordered are listed, but only abnormal results  are displayed) Labs Reviewed  CBC WITH DIFFERENTIAL/PLATELET - Abnormal; Notable for the following components:      Result Value   Platelets 141 (*)    All other components within normal limits  BASIC METABOLIC PANEL - Abnormal; Notable for the following components:   Glucose, Bld 102 (*)    Calcium 8.1 (*)    All other components within normal limits  BRAIN NATRIURETIC PEPTIDE  I-STAT TROPONIN, ED    EKG None  Radiology Dg Chest 2 View  Result Date: 10/04/2017 CLINICAL DATA:  72 year old male with shortness of breath and cough. History of lung cancer. EXAM: CHEST - 2 VIEW COMPARISON:  Chest radiograph dated 03/26/2015 FINDINGS: There is emphysematous changes of the lungs with  areas of subpleural scarring in the upper lobes. Left mid lung field linear and streaky densities consistent with scarring. There is a 2.2 x 2.7 cm nodule along the left posterior pleural on the lateral view. There is no consolidative changes. No pleural effusion or pneumothorax. The cardiac silhouette is within normal limits. No acute osseous pathology. IMPRESSION: 1. No acute cardiopulmonary process. 2. Emphysema. 3. Areas of scarring and a left posterior pleural based nodule. Electronically Signed   By: Anner Crete M.D.   On: 10/04/2017 04:33    Procedures Procedures (including critical care time)  Medications Ordered in ED Medications  ipratropium-albuterol (DUONEB) 0.5-2.5 (3) MG/3ML nebulizer solution 3 mL (3 mLs Nebulization Given 10/04/17 0506)  albuterol (PROVENTIL) (2.5 MG/3ML) 0.083% nebulizer solution 2.5 mg (2.5 mg Nebulization Given 10/04/17 0504)  methylPREDNISolone sodium succinate (SOLU-MEDROL) 125 mg/2 mL injection 125 mg (125 mg Intravenous Given 10/04/17 0451)     Initial Impression / Assessment and Plan / ED Course  I have reviewed the triage vital signs and the nursing notes.  Pertinent labs & imaging results that were available during my care of the patient were reviewed by me and  considered in my medical decision making (see chart for details).     Patient presents to the emergency department for evaluation of difficulty breathing.  Patient has a history of COPD and lung cancer, status post resection of the left lower lobe.  Patient reports increased cough, shortness of breath.  Examination reveals wheezing and decreased breath sounds at arrival.  Chest x-ray does not show obvious pneumonia. Patient administered Solu-Medrol and nebulizers here in the ER.  He has had significant improvement.  He is talking in full sentences, is much more comfortable.  Based on this, I do not believe he requires hospitalization.  Will initiate antibiotic coverage as well as continued outpatient prednisone, nebulizer treatments.  Family reports that he had a fall earlier.  It up to go to the bathroom and fell to the ground.  Is not clear if he passed out.  Patient does have an abrasion on his left elbow and on the back of his left shoulder.  No obvious deformity on examination.  X-rays negative.  He did not hit his head, no headache, neck pain or back pain.  Final Clinical Impressions(s) / ED Diagnoses   Final diagnoses:  COPD exacerbation [facility])    ED Discharge Orders    None       Orpah Greek, MD 10/04/17 [id number]

## 2017-10-04 NOTE — ED Notes (Signed)
Respiratory at bedside with patient.

## 2017-10-04 NOTE — ED Triage Notes (Signed)
Pt in by [facility] for sob and "sick" for a week.  Pt states "I can't hardly get my breath and I'm just sick"  Pt speaks in complete sentences, no obvious distress noted.  Per ems, pt vomited x 1 in the truck.   Pt received zofran 4 mg.  Pt denies pain

## 2018-03-20 DIAGNOSIS — R000 Tachycardia, unspecified: Secondary | ICD-10-CM | POA: Diagnosis not present

## 2018-03-20 DIAGNOSIS — R05 Cough: Secondary | ICD-10-CM | POA: Diagnosis not present

## 2018-03-20 DIAGNOSIS — R0602 Shortness of breath: Secondary | ICD-10-CM | POA: Diagnosis not present

## 2018-03-20 DIAGNOSIS — J441 Chronic obstructive pulmonary disease with (acute) exacerbation: Secondary | ICD-10-CM | POA: Diagnosis not present

## 2018-03-20 DIAGNOSIS — R0902 Hypoxemia: Secondary | ICD-10-CM | POA: Diagnosis not present

## 2018-03-20 DIAGNOSIS — J80 Acute respiratory distress syndrome: Secondary | ICD-10-CM | POA: Diagnosis not present

## 2018-03-20 DIAGNOSIS — I10 Essential (primary) hypertension: Secondary | ICD-10-CM | POA: Diagnosis not present

## 2018-03-20 DIAGNOSIS — Z85118 Personal history of other malignant neoplasm of bronchus and lung: Secondary | ICD-10-CM | POA: Diagnosis not present

## 2018-03-20 DIAGNOSIS — Z79899 Other long term (current) drug therapy: Secondary | ICD-10-CM | POA: Diagnosis not present

## 2018-03-20 DIAGNOSIS — Z87891 Personal history of nicotine dependence: Secondary | ICD-10-CM | POA: Diagnosis not present

## 2018-03-21 DIAGNOSIS — R0602 Shortness of breath: Secondary | ICD-10-CM | POA: Diagnosis not present

## 2018-04-12 DIAGNOSIS — R0602 Shortness of breath: Secondary | ICD-10-CM | POA: Diagnosis not present

## 2018-04-12 DIAGNOSIS — Z85118 Personal history of other malignant neoplasm of bronchus and lung: Secondary | ICD-10-CM | POA: Diagnosis not present

## 2018-04-12 DIAGNOSIS — Z902 Acquired absence of lung [part of]: Secondary | ICD-10-CM | POA: Diagnosis not present

## 2018-04-12 DIAGNOSIS — Z87891 Personal history of nicotine dependence: Secondary | ICD-10-CM | POA: Diagnosis not present

## 2018-04-12 DIAGNOSIS — J439 Emphysema, unspecified: Secondary | ICD-10-CM | POA: Diagnosis not present

## 2018-04-12 DIAGNOSIS — I252 Old myocardial infarction: Secondary | ICD-10-CM | POA: Diagnosis not present

## 2018-04-12 DIAGNOSIS — J441 Chronic obstructive pulmonary disease with (acute) exacerbation: Secondary | ICD-10-CM | POA: Diagnosis not present

## 2018-04-12 DIAGNOSIS — Z9981 Dependence on supplemental oxygen: Secondary | ICD-10-CM | POA: Diagnosis not present

## 2018-04-12 DIAGNOSIS — I10 Essential (primary) hypertension: Secondary | ICD-10-CM | POA: Diagnosis not present

## 2018-04-24 DIAGNOSIS — I10 Essential (primary) hypertension: Secondary | ICD-10-CM | POA: Diagnosis not present

## 2018-04-24 DIAGNOSIS — F329 Major depressive disorder, single episode, unspecified: Secondary | ICD-10-CM | POA: Diagnosis not present

## 2018-04-24 DIAGNOSIS — J449 Chronic obstructive pulmonary disease, unspecified: Secondary | ICD-10-CM | POA: Diagnosis not present

## 2018-04-24 DIAGNOSIS — Z85118 Personal history of other malignant neoplasm of bronchus and lung: Secondary | ICD-10-CM | POA: Diagnosis not present

## 2018-05-08 DIAGNOSIS — Z87891 Personal history of nicotine dependence: Secondary | ICD-10-CM | POA: Diagnosis not present

## 2018-05-08 DIAGNOSIS — K219 Gastro-esophageal reflux disease without esophagitis: Secondary | ICD-10-CM | POA: Diagnosis not present

## 2018-05-08 DIAGNOSIS — R0789 Other chest pain: Secondary | ICD-10-CM | POA: Diagnosis not present

## 2018-05-08 DIAGNOSIS — R0600 Dyspnea, unspecified: Secondary | ICD-10-CM | POA: Diagnosis not present

## 2018-05-08 DIAGNOSIS — R0902 Hypoxemia: Secondary | ICD-10-CM | POA: Diagnosis not present

## 2018-05-08 DIAGNOSIS — R069 Unspecified abnormalities of breathing: Secondary | ICD-10-CM | POA: Diagnosis not present

## 2018-05-08 DIAGNOSIS — R112 Nausea with vomiting, unspecified: Secondary | ICD-10-CM | POA: Diagnosis not present

## 2018-05-08 DIAGNOSIS — Z85118 Personal history of other malignant neoplasm of bronchus and lung: Secondary | ICD-10-CM | POA: Diagnosis not present

## 2018-05-08 DIAGNOSIS — I712 Thoracic aortic aneurysm, without rupture: Secondary | ICD-10-CM | POA: Diagnosis not present

## 2018-05-08 DIAGNOSIS — J440 Chronic obstructive pulmonary disease with acute lower respiratory infection: Secondary | ICD-10-CM | POA: Diagnosis not present

## 2018-05-08 DIAGNOSIS — J9601 Acute respiratory failure with hypoxia: Secondary | ICD-10-CM | POA: Diagnosis not present

## 2018-05-08 DIAGNOSIS — K5730 Diverticulosis of large intestine without perforation or abscess without bleeding: Secondary | ICD-10-CM | POA: Diagnosis not present

## 2018-05-08 DIAGNOSIS — J209 Acute bronchitis, unspecified: Secondary | ICD-10-CM | POA: Diagnosis not present

## 2018-05-08 DIAGNOSIS — Z681 Body mass index (BMI) 19 or less, adult: Secondary | ICD-10-CM | POA: Diagnosis not present

## 2018-05-08 DIAGNOSIS — I491 Atrial premature depolarization: Secondary | ICD-10-CM | POA: Diagnosis not present

## 2018-05-08 DIAGNOSIS — J9620 Acute and chronic respiratory failure, unspecified whether with hypoxia or hypercapnia: Secondary | ICD-10-CM | POA: Diagnosis not present

## 2018-05-08 DIAGNOSIS — K5900 Constipation, unspecified: Secondary | ICD-10-CM | POA: Diagnosis not present

## 2018-05-08 DIAGNOSIS — J441 Chronic obstructive pulmonary disease with (acute) exacerbation: Secondary | ICD-10-CM | POA: Diagnosis not present

## 2018-05-08 DIAGNOSIS — Z902 Acquired absence of lung [part of]: Secondary | ICD-10-CM | POA: Diagnosis not present

## 2018-05-08 DIAGNOSIS — I10 Essential (primary) hypertension: Secondary | ICD-10-CM | POA: Diagnosis not present

## 2018-05-08 DIAGNOSIS — J9621 Acute and chronic respiratory failure with hypoxia: Secondary | ICD-10-CM | POA: Diagnosis not present

## 2018-05-08 DIAGNOSIS — R000 Tachycardia, unspecified: Secondary | ICD-10-CM | POA: Diagnosis not present

## 2018-05-08 DIAGNOSIS — R5381 Other malaise: Secondary | ICD-10-CM | POA: Diagnosis not present

## 2018-05-08 DIAGNOSIS — R630 Anorexia: Secondary | ICD-10-CM | POA: Diagnosis not present

## 2018-05-08 DIAGNOSIS — J449 Chronic obstructive pulmonary disease, unspecified: Secondary | ICD-10-CM | POA: Diagnosis not present

## 2018-05-08 DIAGNOSIS — R0602 Shortness of breath: Secondary | ICD-10-CM | POA: Diagnosis not present

## 2018-05-08 DIAGNOSIS — J9611 Chronic respiratory failure with hypoxia: Secondary | ICD-10-CM | POA: Diagnosis not present

## 2018-05-08 DIAGNOSIS — J80 Acute respiratory distress syndrome: Secondary | ICD-10-CM | POA: Diagnosis not present

## 2018-05-08 DIAGNOSIS — E43 Unspecified severe protein-calorie malnutrition: Secondary | ICD-10-CM | POA: Diagnosis not present

## 2018-05-08 DIAGNOSIS — Z8673 Personal history of transient ischemic attack (TIA), and cerebral infarction without residual deficits: Secondary | ICD-10-CM | POA: Diagnosis not present

## 2018-05-08 DIAGNOSIS — F411 Generalized anxiety disorder: Secondary | ICD-10-CM | POA: Diagnosis not present

## 2018-05-08 DIAGNOSIS — M6281 Muscle weakness (generalized): Secondary | ICD-10-CM | POA: Diagnosis not present

## 2018-05-16 DIAGNOSIS — R0600 Dyspnea, unspecified: Secondary | ICD-10-CM | POA: Diagnosis not present

## 2018-05-30 DIAGNOSIS — Z85118 Personal history of other malignant neoplasm of bronchus and lung: Secondary | ICD-10-CM | POA: Diagnosis not present

## 2018-05-30 DIAGNOSIS — J209 Acute bronchitis, unspecified: Secondary | ICD-10-CM | POA: Diagnosis not present

## 2018-05-30 DIAGNOSIS — J439 Emphysema, unspecified: Secondary | ICD-10-CM | POA: Diagnosis not present

## 2018-05-30 DIAGNOSIS — Z87891 Personal history of nicotine dependence: Secondary | ICD-10-CM | POA: Diagnosis not present

## 2018-05-30 DIAGNOSIS — Z902 Acquired absence of lung [part of]: Secondary | ICD-10-CM | POA: Diagnosis not present

## 2018-05-30 DIAGNOSIS — Z9981 Dependence on supplemental oxygen: Secondary | ICD-10-CM | POA: Diagnosis not present

## 2018-05-30 DIAGNOSIS — Z8673 Personal history of transient ischemic attack (TIA), and cerebral infarction without residual deficits: Secondary | ICD-10-CM | POA: Diagnosis not present

## 2018-06-03 DIAGNOSIS — J439 Emphysema, unspecified: Secondary | ICD-10-CM | POA: Diagnosis not present

## 2018-06-03 DIAGNOSIS — Z85118 Personal history of other malignant neoplasm of bronchus and lung: Secondary | ICD-10-CM | POA: Diagnosis not present

## 2018-06-03 DIAGNOSIS — J209 Acute bronchitis, unspecified: Secondary | ICD-10-CM | POA: Diagnosis not present

## 2018-06-03 DIAGNOSIS — Z9981 Dependence on supplemental oxygen: Secondary | ICD-10-CM | POA: Diagnosis not present

## 2018-06-03 DIAGNOSIS — Z902 Acquired absence of lung [part of]: Secondary | ICD-10-CM | POA: Diagnosis not present

## 2018-06-03 DIAGNOSIS — Z8673 Personal history of transient ischemic attack (TIA), and cerebral infarction without residual deficits: Secondary | ICD-10-CM | POA: Diagnosis not present

## 2018-06-03 DIAGNOSIS — Z87891 Personal history of nicotine dependence: Secondary | ICD-10-CM | POA: Diagnosis not present

## 2018-06-04 DIAGNOSIS — J449 Chronic obstructive pulmonary disease, unspecified: Secondary | ICD-10-CM | POA: Diagnosis not present

## 2018-06-05 DIAGNOSIS — Z902 Acquired absence of lung [part of]: Secondary | ICD-10-CM | POA: Diagnosis not present

## 2018-06-05 DIAGNOSIS — Z8673 Personal history of transient ischemic attack (TIA), and cerebral infarction without residual deficits: Secondary | ICD-10-CM | POA: Diagnosis not present

## 2018-06-05 DIAGNOSIS — Z85118 Personal history of other malignant neoplasm of bronchus and lung: Secondary | ICD-10-CM | POA: Diagnosis not present

## 2018-06-05 DIAGNOSIS — Z87891 Personal history of nicotine dependence: Secondary | ICD-10-CM | POA: Diagnosis not present

## 2018-06-05 DIAGNOSIS — J439 Emphysema, unspecified: Secondary | ICD-10-CM | POA: Diagnosis not present

## 2018-06-05 DIAGNOSIS — J209 Acute bronchitis, unspecified: Secondary | ICD-10-CM | POA: Diagnosis not present

## 2018-06-05 DIAGNOSIS — Z9981 Dependence on supplemental oxygen: Secondary | ICD-10-CM | POA: Diagnosis not present

## 2018-06-09 DIAGNOSIS — Z85118 Personal history of other malignant neoplasm of bronchus and lung: Secondary | ICD-10-CM | POA: Diagnosis not present

## 2018-06-09 DIAGNOSIS — J439 Emphysema, unspecified: Secondary | ICD-10-CM | POA: Diagnosis not present

## 2018-06-09 DIAGNOSIS — Z902 Acquired absence of lung [part of]: Secondary | ICD-10-CM | POA: Diagnosis not present

## 2018-06-09 DIAGNOSIS — Z87891 Personal history of nicotine dependence: Secondary | ICD-10-CM | POA: Diagnosis not present

## 2018-06-09 DIAGNOSIS — Z8673 Personal history of transient ischemic attack (TIA), and cerebral infarction without residual deficits: Secondary | ICD-10-CM | POA: Diagnosis not present

## 2018-06-09 DIAGNOSIS — J209 Acute bronchitis, unspecified: Secondary | ICD-10-CM | POA: Diagnosis not present

## 2018-06-09 DIAGNOSIS — Z9981 Dependence on supplemental oxygen: Secondary | ICD-10-CM | POA: Diagnosis not present

## 2018-06-12 DIAGNOSIS — Z9981 Dependence on supplemental oxygen: Secondary | ICD-10-CM | POA: Diagnosis not present

## 2018-06-12 DIAGNOSIS — Z87891 Personal history of nicotine dependence: Secondary | ICD-10-CM | POA: Diagnosis not present

## 2018-06-12 DIAGNOSIS — J209 Acute bronchitis, unspecified: Secondary | ICD-10-CM | POA: Diagnosis not present

## 2018-06-12 DIAGNOSIS — J439 Emphysema, unspecified: Secondary | ICD-10-CM | POA: Diagnosis not present

## 2018-06-12 DIAGNOSIS — Z85118 Personal history of other malignant neoplasm of bronchus and lung: Secondary | ICD-10-CM | POA: Diagnosis not present

## 2018-06-12 DIAGNOSIS — Z902 Acquired absence of lung [part of]: Secondary | ICD-10-CM | POA: Diagnosis not present

## 2018-06-12 DIAGNOSIS — Z8673 Personal history of transient ischemic attack (TIA), and cerebral infarction without residual deficits: Secondary | ICD-10-CM | POA: Diagnosis not present

## 2018-06-13 DIAGNOSIS — Z85118 Personal history of other malignant neoplasm of bronchus and lung: Secondary | ICD-10-CM | POA: Diagnosis not present

## 2018-06-13 DIAGNOSIS — J439 Emphysema, unspecified: Secondary | ICD-10-CM | POA: Diagnosis not present

## 2018-06-13 DIAGNOSIS — Z902 Acquired absence of lung [part of]: Secondary | ICD-10-CM | POA: Diagnosis not present

## 2018-06-13 DIAGNOSIS — Z9981 Dependence on supplemental oxygen: Secondary | ICD-10-CM | POA: Diagnosis not present

## 2018-06-13 DIAGNOSIS — Z8673 Personal history of transient ischemic attack (TIA), and cerebral infarction without residual deficits: Secondary | ICD-10-CM | POA: Diagnosis not present

## 2018-06-13 DIAGNOSIS — Z87891 Personal history of nicotine dependence: Secondary | ICD-10-CM | POA: Diagnosis not present

## 2018-06-13 DIAGNOSIS — J209 Acute bronchitis, unspecified: Secondary | ICD-10-CM | POA: Diagnosis not present

## 2018-06-16 DIAGNOSIS — J209 Acute bronchitis, unspecified: Secondary | ICD-10-CM | POA: Diagnosis not present

## 2018-06-16 DIAGNOSIS — Z87891 Personal history of nicotine dependence: Secondary | ICD-10-CM | POA: Diagnosis not present

## 2018-06-16 DIAGNOSIS — Z9981 Dependence on supplemental oxygen: Secondary | ICD-10-CM | POA: Diagnosis not present

## 2018-06-16 DIAGNOSIS — Z85118 Personal history of other malignant neoplasm of bronchus and lung: Secondary | ICD-10-CM | POA: Diagnosis not present

## 2018-06-16 DIAGNOSIS — Z902 Acquired absence of lung [part of]: Secondary | ICD-10-CM | POA: Diagnosis not present

## 2018-06-16 DIAGNOSIS — Z8673 Personal history of transient ischemic attack (TIA), and cerebral infarction without residual deficits: Secondary | ICD-10-CM | POA: Diagnosis not present

## 2018-06-16 DIAGNOSIS — J439 Emphysema, unspecified: Secondary | ICD-10-CM | POA: Diagnosis not present

## 2018-06-18 DIAGNOSIS — Z8673 Personal history of transient ischemic attack (TIA), and cerebral infarction without residual deficits: Secondary | ICD-10-CM | POA: Diagnosis not present

## 2018-06-18 DIAGNOSIS — Z902 Acquired absence of lung [part of]: Secondary | ICD-10-CM | POA: Diagnosis not present

## 2018-06-18 DIAGNOSIS — Z87891 Personal history of nicotine dependence: Secondary | ICD-10-CM | POA: Diagnosis not present

## 2018-06-18 DIAGNOSIS — Z85118 Personal history of other malignant neoplasm of bronchus and lung: Secondary | ICD-10-CM | POA: Diagnosis not present

## 2018-06-18 DIAGNOSIS — J209 Acute bronchitis, unspecified: Secondary | ICD-10-CM | POA: Diagnosis not present

## 2018-06-18 DIAGNOSIS — Z9981 Dependence on supplemental oxygen: Secondary | ICD-10-CM | POA: Diagnosis not present

## 2018-06-18 DIAGNOSIS — J439 Emphysema, unspecified: Secondary | ICD-10-CM | POA: Diagnosis not present

## 2018-06-19 DIAGNOSIS — J439 Emphysema, unspecified: Secondary | ICD-10-CM | POA: Diagnosis not present

## 2018-06-19 DIAGNOSIS — Z8673 Personal history of transient ischemic attack (TIA), and cerebral infarction without residual deficits: Secondary | ICD-10-CM | POA: Diagnosis not present

## 2018-06-19 DIAGNOSIS — Z87891 Personal history of nicotine dependence: Secondary | ICD-10-CM | POA: Diagnosis not present

## 2018-06-19 DIAGNOSIS — Z85118 Personal history of other malignant neoplasm of bronchus and lung: Secondary | ICD-10-CM | POA: Diagnosis not present

## 2018-06-19 DIAGNOSIS — Z902 Acquired absence of lung [part of]: Secondary | ICD-10-CM | POA: Diagnosis not present

## 2018-06-19 DIAGNOSIS — J209 Acute bronchitis, unspecified: Secondary | ICD-10-CM | POA: Diagnosis not present

## 2018-06-19 DIAGNOSIS — Z9981 Dependence on supplemental oxygen: Secondary | ICD-10-CM | POA: Diagnosis not present

## 2018-06-23 DIAGNOSIS — J439 Emphysema, unspecified: Secondary | ICD-10-CM | POA: Diagnosis not present

## 2018-06-23 DIAGNOSIS — Z902 Acquired absence of lung [part of]: Secondary | ICD-10-CM | POA: Diagnosis not present

## 2018-06-23 DIAGNOSIS — Z8673 Personal history of transient ischemic attack (TIA), and cerebral infarction without residual deficits: Secondary | ICD-10-CM | POA: Diagnosis not present

## 2018-06-23 DIAGNOSIS — Z85118 Personal history of other malignant neoplasm of bronchus and lung: Secondary | ICD-10-CM | POA: Diagnosis not present

## 2018-06-23 DIAGNOSIS — J209 Acute bronchitis, unspecified: Secondary | ICD-10-CM | POA: Diagnosis not present

## 2018-06-23 DIAGNOSIS — Z87891 Personal history of nicotine dependence: Secondary | ICD-10-CM | POA: Diagnosis not present

## 2018-06-23 DIAGNOSIS — Z9981 Dependence on supplemental oxygen: Secondary | ICD-10-CM | POA: Diagnosis not present

## 2018-06-25 DIAGNOSIS — Z87891 Personal history of nicotine dependence: Secondary | ICD-10-CM | POA: Diagnosis not present

## 2018-06-25 DIAGNOSIS — J439 Emphysema, unspecified: Secondary | ICD-10-CM | POA: Diagnosis not present

## 2018-06-25 DIAGNOSIS — Z9981 Dependence on supplemental oxygen: Secondary | ICD-10-CM | POA: Diagnosis not present

## 2018-06-25 DIAGNOSIS — Z902 Acquired absence of lung [part of]: Secondary | ICD-10-CM | POA: Diagnosis not present

## 2018-06-25 DIAGNOSIS — Z8673 Personal history of transient ischemic attack (TIA), and cerebral infarction without residual deficits: Secondary | ICD-10-CM | POA: Diagnosis not present

## 2018-06-25 DIAGNOSIS — J209 Acute bronchitis, unspecified: Secondary | ICD-10-CM | POA: Diagnosis not present

## 2018-06-25 DIAGNOSIS — Z85118 Personal history of other malignant neoplasm of bronchus and lung: Secondary | ICD-10-CM | POA: Diagnosis not present

## 2018-07-01 DIAGNOSIS — Z87891 Personal history of nicotine dependence: Secondary | ICD-10-CM | POA: Diagnosis not present

## 2018-07-01 DIAGNOSIS — Z85118 Personal history of other malignant neoplasm of bronchus and lung: Secondary | ICD-10-CM | POA: Diagnosis not present

## 2018-07-01 DIAGNOSIS — J209 Acute bronchitis, unspecified: Secondary | ICD-10-CM | POA: Diagnosis not present

## 2018-07-01 DIAGNOSIS — Z902 Acquired absence of lung [part of]: Secondary | ICD-10-CM | POA: Diagnosis not present

## 2018-07-01 DIAGNOSIS — Z8673 Personal history of transient ischemic attack (TIA), and cerebral infarction without residual deficits: Secondary | ICD-10-CM | POA: Diagnosis not present

## 2018-07-01 DIAGNOSIS — J439 Emphysema, unspecified: Secondary | ICD-10-CM | POA: Diagnosis not present

## 2018-07-01 DIAGNOSIS — Z9981 Dependence on supplemental oxygen: Secondary | ICD-10-CM | POA: Diagnosis not present

## 2018-07-03 DIAGNOSIS — Z8673 Personal history of transient ischemic attack (TIA), and cerebral infarction without residual deficits: Secondary | ICD-10-CM | POA: Diagnosis not present

## 2018-07-03 DIAGNOSIS — Z87891 Personal history of nicotine dependence: Secondary | ICD-10-CM | POA: Diagnosis not present

## 2018-07-03 DIAGNOSIS — Z9981 Dependence on supplemental oxygen: Secondary | ICD-10-CM | POA: Diagnosis not present

## 2018-07-03 DIAGNOSIS — J439 Emphysema, unspecified: Secondary | ICD-10-CM | POA: Diagnosis not present

## 2018-07-03 DIAGNOSIS — J449 Chronic obstructive pulmonary disease, unspecified: Secondary | ICD-10-CM | POA: Diagnosis not present

## 2018-07-03 DIAGNOSIS — J209 Acute bronchitis, unspecified: Secondary | ICD-10-CM | POA: Diagnosis not present

## 2018-07-03 DIAGNOSIS — Z85118 Personal history of other malignant neoplasm of bronchus and lung: Secondary | ICD-10-CM | POA: Diagnosis not present

## 2018-07-03 DIAGNOSIS — Z902 Acquired absence of lung [part of]: Secondary | ICD-10-CM | POA: Diagnosis not present

## 2018-08-03 DIAGNOSIS — J449 Chronic obstructive pulmonary disease, unspecified: Secondary | ICD-10-CM | POA: Diagnosis not present

## 2018-09-02 DIAGNOSIS — J449 Chronic obstructive pulmonary disease, unspecified: Secondary | ICD-10-CM | POA: Diagnosis not present

## 2018-10-03 DIAGNOSIS — J449 Chronic obstructive pulmonary disease, unspecified: Secondary | ICD-10-CM | POA: Diagnosis not present

## 2018-11-02 DIAGNOSIS — J449 Chronic obstructive pulmonary disease, unspecified: Secondary | ICD-10-CM | POA: Diagnosis not present

## 2018-12-03 DIAGNOSIS — J449 Chronic obstructive pulmonary disease, unspecified: Secondary | ICD-10-CM | POA: Diagnosis not present

## 2019-01-03 DIAGNOSIS — J449 Chronic obstructive pulmonary disease, unspecified: Secondary | ICD-10-CM | POA: Diagnosis not present

## 2019-02-02 DIAGNOSIS — J449 Chronic obstructive pulmonary disease, unspecified: Secondary | ICD-10-CM | POA: Diagnosis not present

## 2019-02-26 DIAGNOSIS — F329 Major depressive disorder, single episode, unspecified: Secondary | ICD-10-CM | POA: Diagnosis not present

## 2019-02-26 DIAGNOSIS — Z85118 Personal history of other malignant neoplasm of bronchus and lung: Secondary | ICD-10-CM | POA: Diagnosis not present

## 2019-02-26 DIAGNOSIS — J449 Chronic obstructive pulmonary disease, unspecified: Secondary | ICD-10-CM | POA: Diagnosis not present

## 2019-02-26 DIAGNOSIS — I10 Essential (primary) hypertension: Secondary | ICD-10-CM | POA: Diagnosis not present

## 2019-03-05 DIAGNOSIS — J449 Chronic obstructive pulmonary disease, unspecified: Secondary | ICD-10-CM | POA: Diagnosis not present

## 2019-03-27 DIAGNOSIS — J449 Chronic obstructive pulmonary disease, unspecified: Secondary | ICD-10-CM | POA: Diagnosis not present

## 2019-03-27 DIAGNOSIS — J8410 Pulmonary fibrosis, unspecified: Secondary | ICD-10-CM | POA: Diagnosis not present

## 2019-03-27 DIAGNOSIS — I10 Essential (primary) hypertension: Secondary | ICD-10-CM | POA: Diagnosis not present

## 2019-03-27 DIAGNOSIS — F329 Major depressive disorder, single episode, unspecified: Secondary | ICD-10-CM | POA: Diagnosis not present

## 2019-03-27 DIAGNOSIS — R918 Other nonspecific abnormal finding of lung field: Secondary | ICD-10-CM | POA: Diagnosis not present

## 2019-04-04 DIAGNOSIS — J449 Chronic obstructive pulmonary disease, unspecified: Secondary | ICD-10-CM | POA: Diagnosis not present

## 2019-05-05 DIAGNOSIS — J449 Chronic obstructive pulmonary disease, unspecified: Secondary | ICD-10-CM | POA: Diagnosis not present

## 2019-06-05 DIAGNOSIS — J449 Chronic obstructive pulmonary disease, unspecified: Secondary | ICD-10-CM | POA: Diagnosis not present

## 2019-07-22 IMAGING — DX DG SHOULDER 2+V*L*
2 series · 2 of 2 positions shown · non-contrast
Comparison: None.

CLINICAL DATA: 72 y/o  M; fall with left elbow and shoulder pain.

EXAM:
LEFT SHOULDER - 2+ VIEW; LEFT ELBOW - COMPLETE 3+ VIEW

[shoulder grashey]
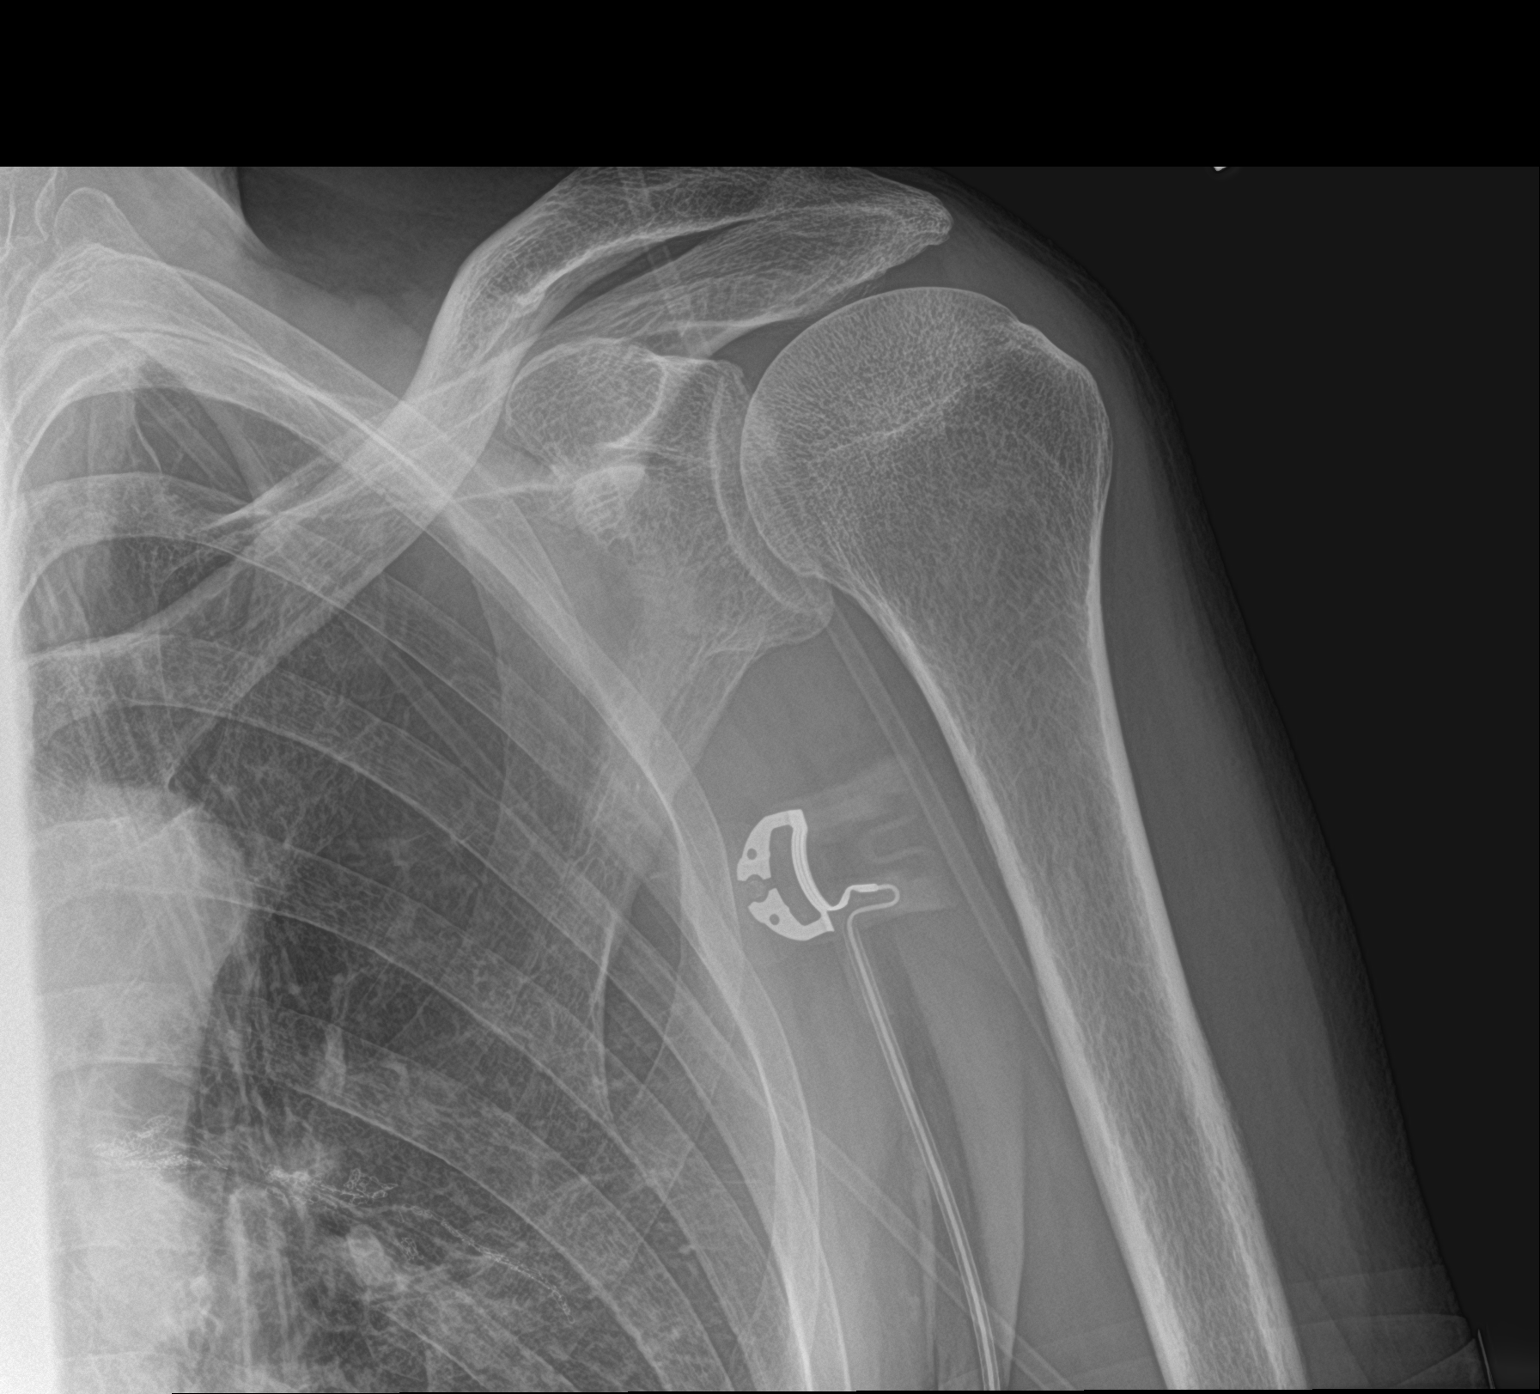

[shoulder y view]
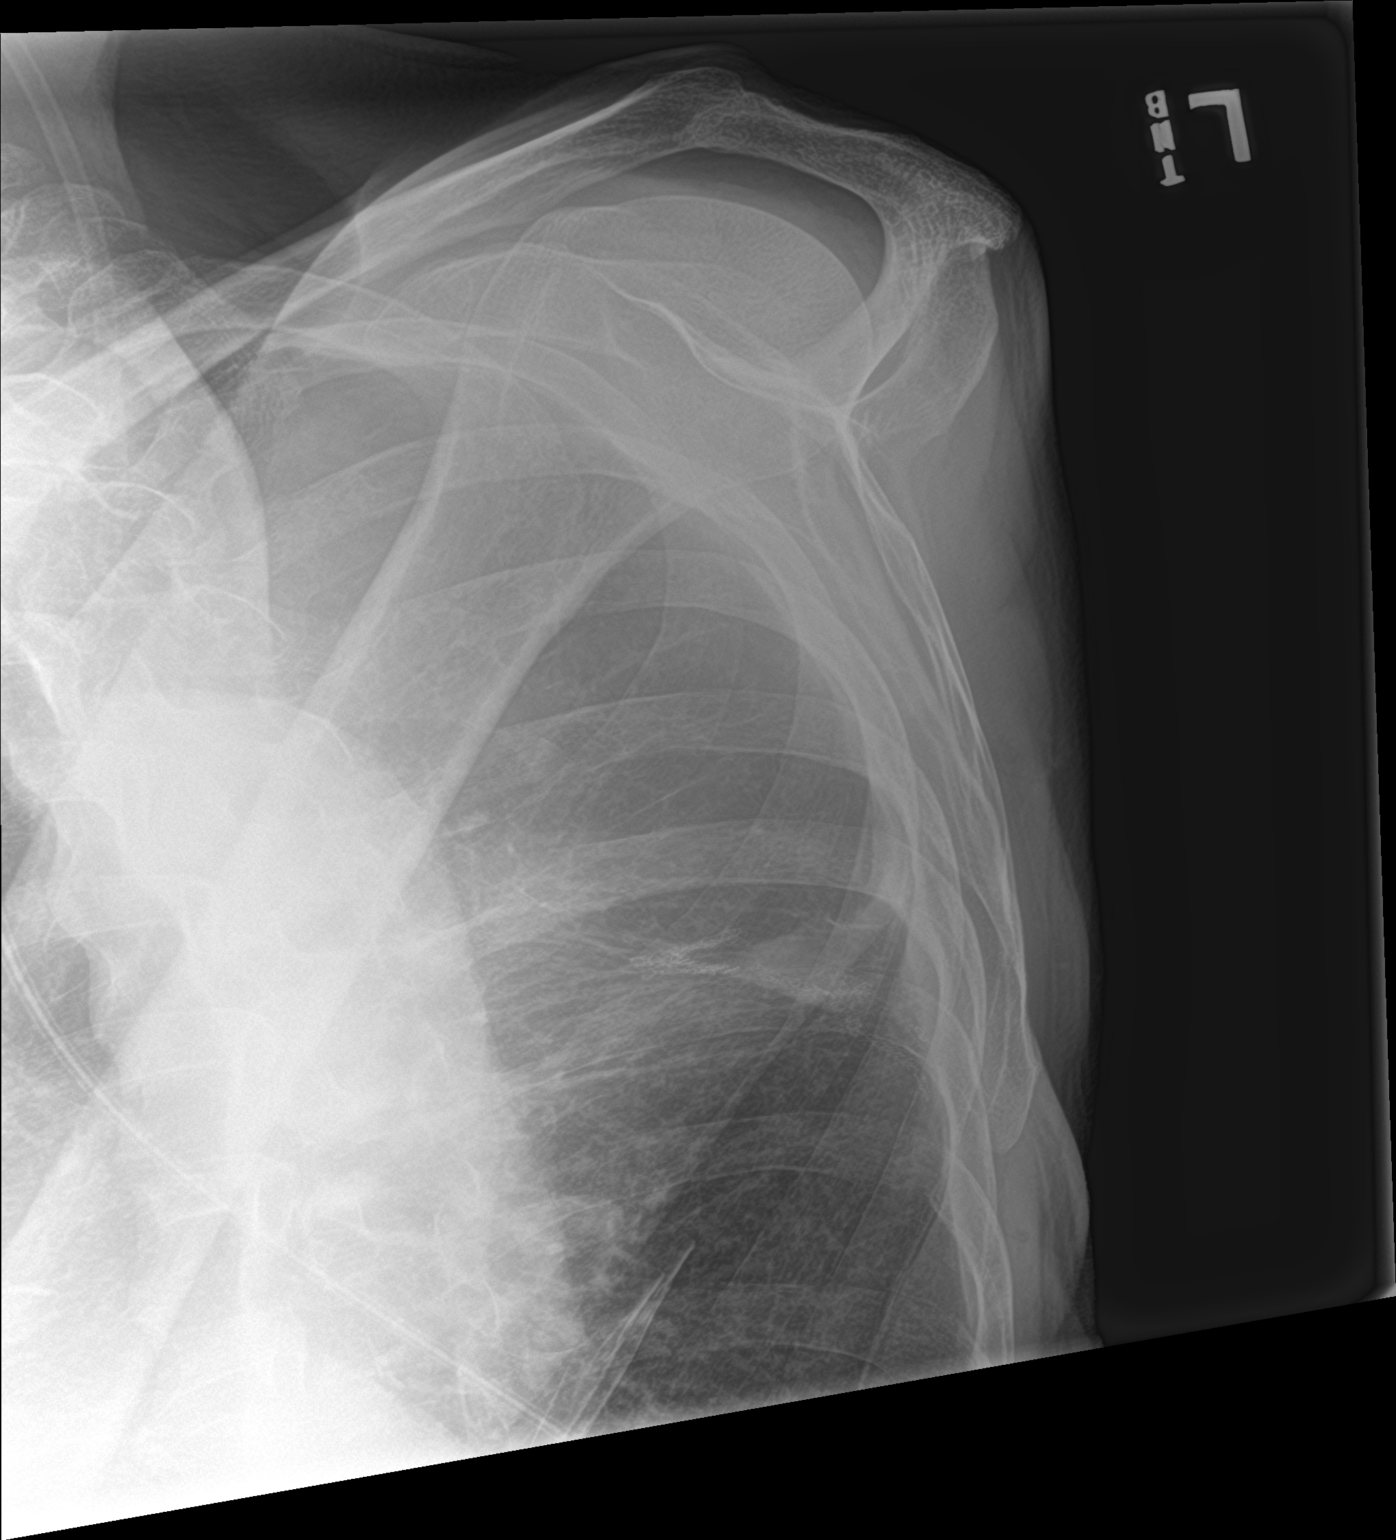

[2 of 2 positions shown; findings below may reference images not displayed]

FINDINGS: Left shoulder:

There is no evidence of fracture or dislocation. There is no
evidence of arthropathy or other focal bone abnormality. Soft
tissues are unremarkable. Left mid lung zone postsurgical changes.

Left elbow:

There is no evidence of fracture or dislocation. There is no
evidence of arthropathy or other focal bone abnormality. Soft
tissues are unremarkable.
IMPRESSION: No acute fracture or dislocation identified.

By: Alia Tiger M.D.

## 2019-07-22 IMAGING — DX DG CHEST 2V
2 series · 2 of 2 positions shown · non-contrast
Comparison: Chest radiograph dated 03/26/2015

CLINICAL DATA: 72-year-old male with shortness of breath and cough.
History of lung cancer.

EXAM:
CHEST - 2 VIEW

[chest lat]
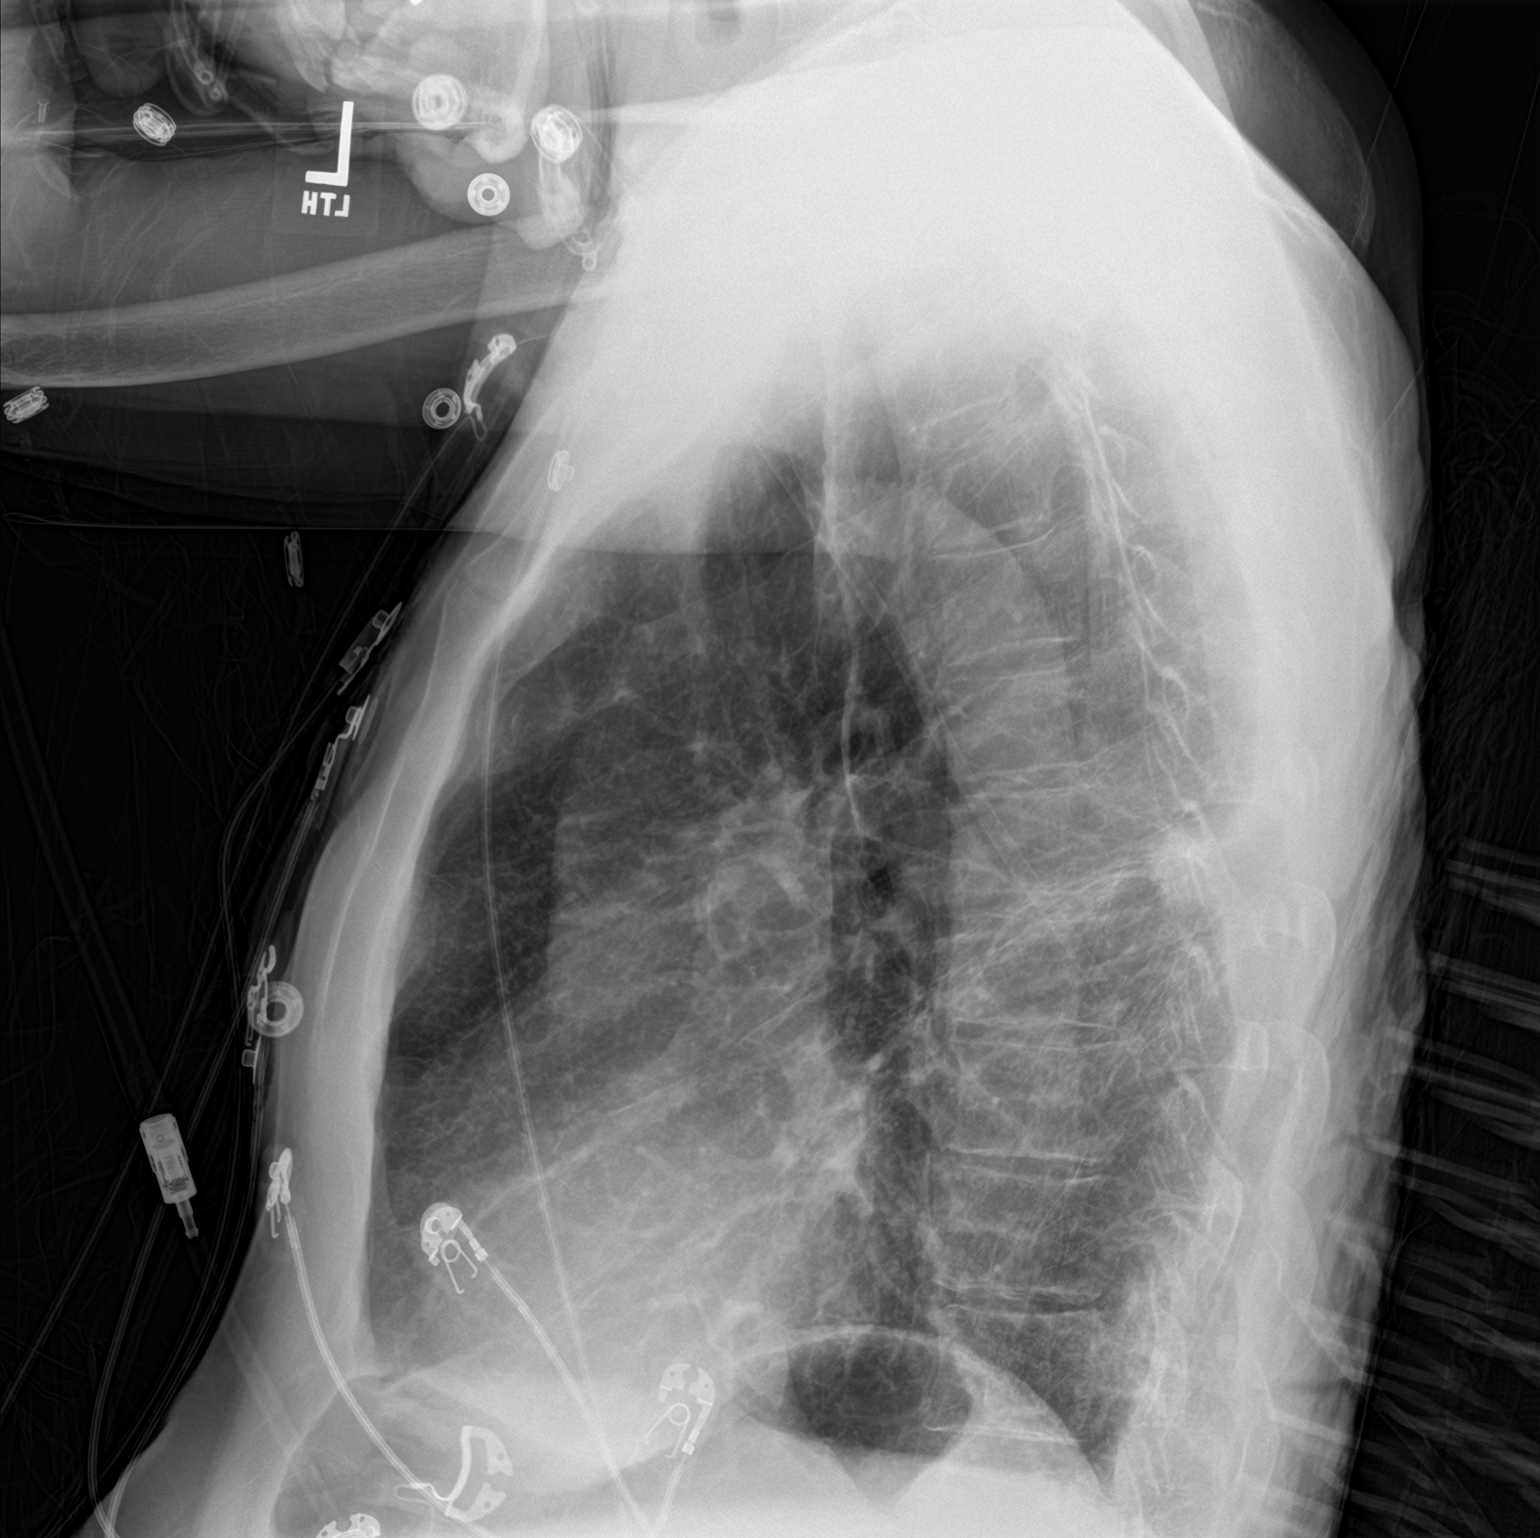

[chest ap]
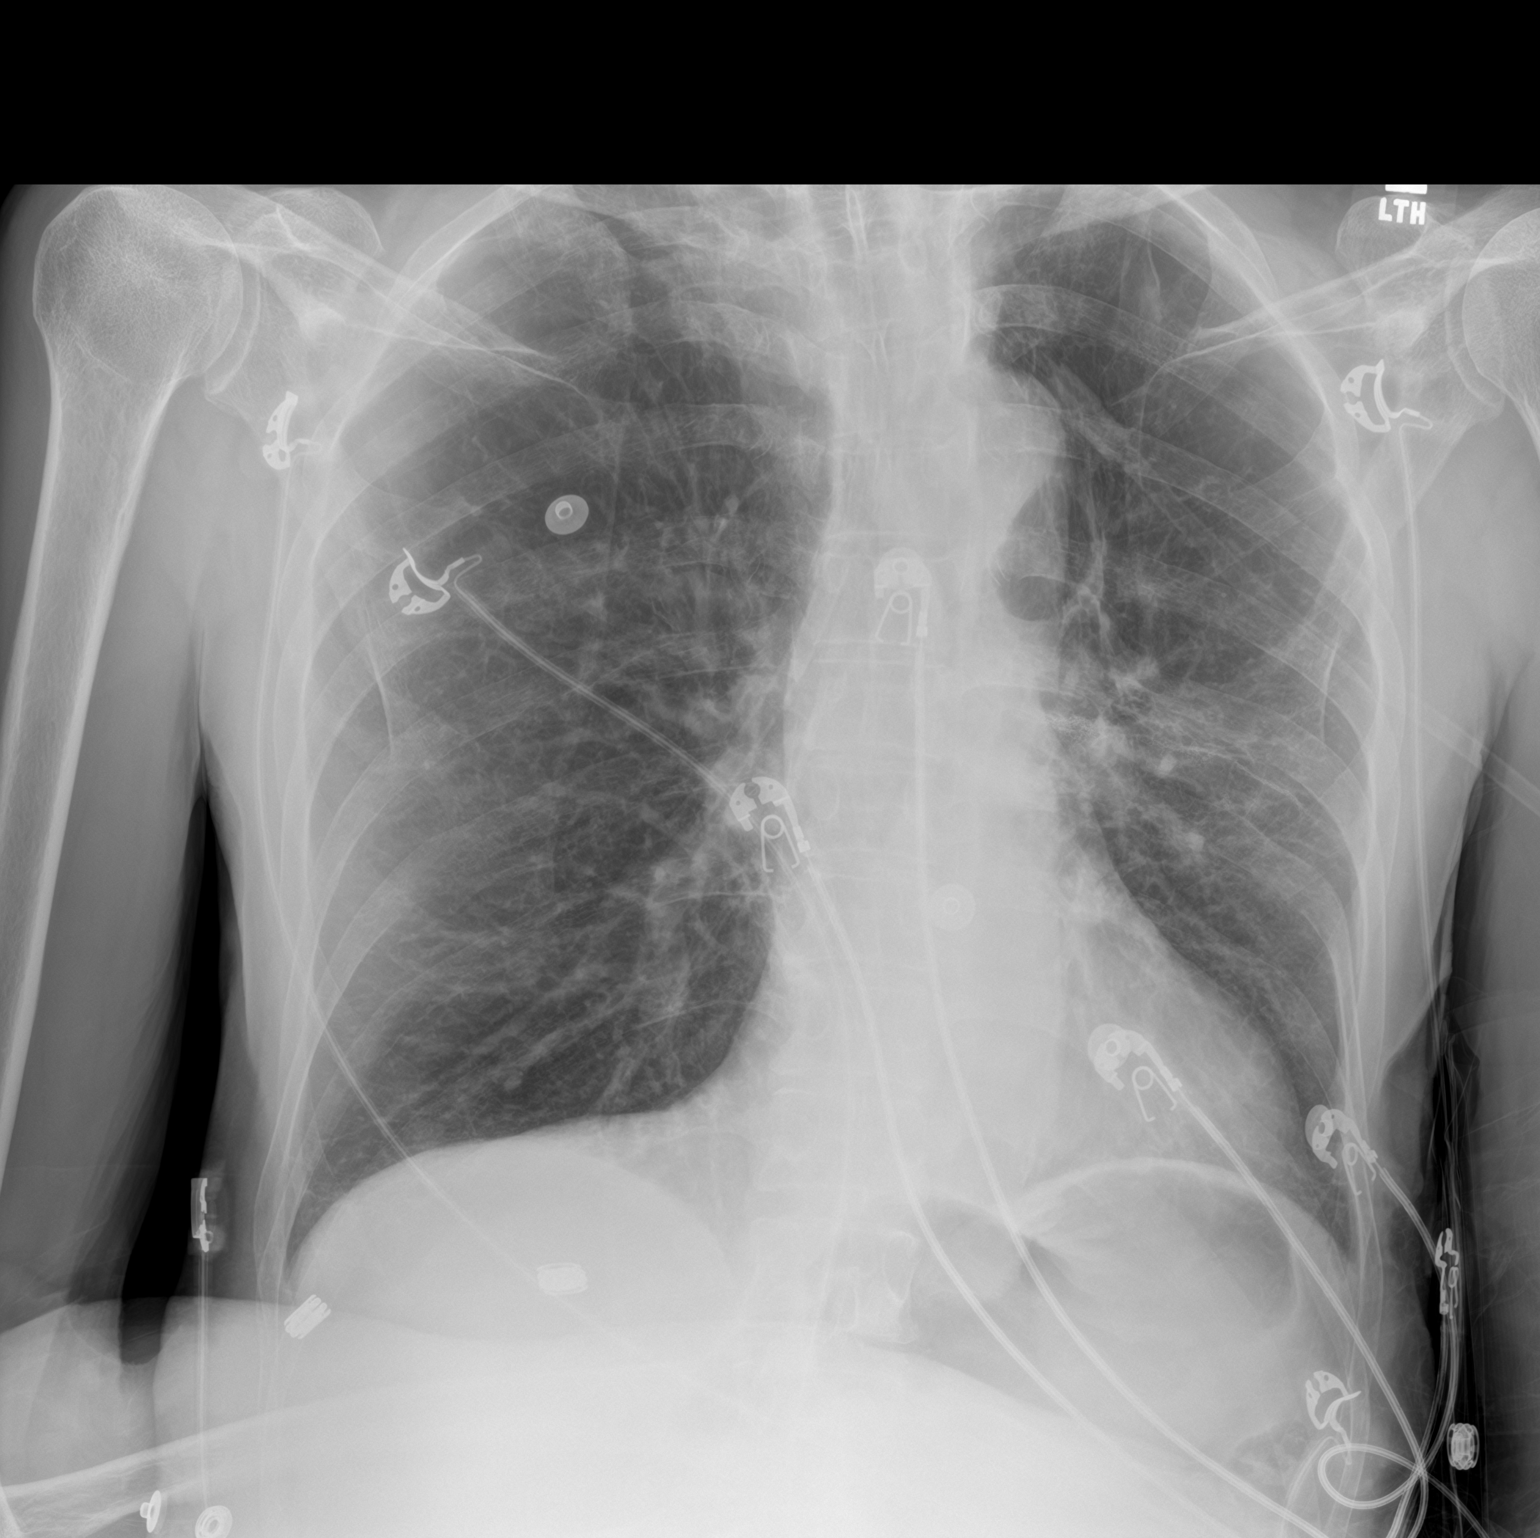

[2 of 2 positions shown; findings below may reference images not displayed]

FINDINGS: There is emphysematous changes of the lungs with areas of subpleural
scarring in the upper lobes. Left mid lung field linear and streaky
densities consistent with scarring. There is a 2.2 x 2.7 cm nodule
along the left posterior pleural on the lateral view. There is no
consolidative changes. No pleural effusion or pneumothorax. The
cardiac silhouette is within normal limits. No acute osseous
pathology.
IMPRESSION: 1. No acute cardiopulmonary process.
2. Emphysema.
3. Areas of scarring and a left posterior pleural based nodule.

## 2019-10-29 ENCOUNTER — Institutional Professional Consult (permissible substitution): Payer: Medicare HMO | Admitting: Pulmonary Disease

## 2019-12-02 DIAGNOSIS — Z9889 Other specified postprocedural states: Secondary | ICD-10-CM | POA: Insufficient documentation

## 2021-10-12 ENCOUNTER — Inpatient Hospital Stay (HOSPITAL_COMMUNITY)
Admission: EM | Admit: 2021-10-12 | Discharge: 2021-10-17 | DRG: 481 | Disposition: A | Discharge status: Skilled Nursing Facility | Payer: Medicare HMO | Attending: Family Medicine | Admitting: Family Medicine

## 2021-10-12 ENCOUNTER — Other Ambulatory Visit: Payer: Self-pay

## 2021-10-12 ENCOUNTER — Emergency Department (HOSPITAL_COMMUNITY): Payer: Medicare HMO

## 2021-10-12 ENCOUNTER — Encounter (HOSPITAL_COMMUNITY): Payer: Self-pay

## 2021-10-12 DIAGNOSIS — Z9221 Personal history of antineoplastic chemotherapy: Secondary | ICD-10-CM

## 2021-10-12 DIAGNOSIS — Y92009 Unspecified place in unspecified non-institutional (private) residence as the place of occurrence of the external cause: Secondary | ICD-10-CM

## 2021-10-12 DIAGNOSIS — C3490 Malignant neoplasm of unspecified part of unspecified bronchus or lung: Secondary | ICD-10-CM | POA: Diagnosis present

## 2021-10-12 DIAGNOSIS — Z8616 Personal history of COVID-19: Secondary | ICD-10-CM

## 2021-10-12 DIAGNOSIS — Z79899 Other long term (current) drug therapy: Secondary | ICD-10-CM

## 2021-10-12 DIAGNOSIS — S72002A Fracture of unspecified part of neck of left femur, initial encounter for closed fracture: Secondary | ICD-10-CM | POA: Diagnosis not present

## 2021-10-12 DIAGNOSIS — Z7951 Long term (current) use of inhaled steroids: Secondary | ICD-10-CM

## 2021-10-12 DIAGNOSIS — Z902 Acquired absence of lung [part of]: Secondary | ICD-10-CM

## 2021-10-12 DIAGNOSIS — Z923 Personal history of irradiation: Secondary | ICD-10-CM

## 2021-10-12 DIAGNOSIS — Z8673 Personal history of transient ischemic attack (TIA), and cerebral infarction without residual deficits: Secondary | ICD-10-CM

## 2021-10-12 DIAGNOSIS — Z87891 Personal history of nicotine dependence: Secondary | ICD-10-CM

## 2021-10-12 DIAGNOSIS — Z85118 Personal history of other malignant neoplasm of bronchus and lung: Secondary | ICD-10-CM

## 2021-10-12 DIAGNOSIS — J9611 Chronic respiratory failure with hypoxia: Secondary | ICD-10-CM | POA: Diagnosis present

## 2021-10-12 DIAGNOSIS — I10 Essential (primary) hypertension: Secondary | ICD-10-CM | POA: Diagnosis present

## 2021-10-12 DIAGNOSIS — F419 Anxiety disorder, unspecified: Secondary | ICD-10-CM | POA: Diagnosis present

## 2021-10-12 DIAGNOSIS — W010XXA Fall on same level from slipping, tripping and stumbling without subsequent striking against object, initial encounter: Secondary | ICD-10-CM | POA: Diagnosis present

## 2021-10-12 DIAGNOSIS — Z9981 Dependence on supplemental oxygen: Secondary | ICD-10-CM

## 2021-10-12 DIAGNOSIS — J449 Chronic obstructive pulmonary disease, unspecified: Secondary | ICD-10-CM | POA: Diagnosis present

## 2021-10-12 NOTE — ED Triage Notes (Signed)
Pt presents to ED via [facility] from home for fall with left hip pain. Pt stood up from couch and slipped on pillow, falling to floor, injuring left hip, EMS reports pt unable to bear weight with slight rotation observed. Pulses present to LLE. Pt was given 8mg  morphine IV in route.

## 2021-10-13 ENCOUNTER — Inpatient Hospital Stay (HOSPITAL_COMMUNITY): Payer: Medicare HMO | Admitting: Anesthesiology

## 2021-10-13 ENCOUNTER — Inpatient Hospital Stay (HOSPITAL_COMMUNITY): Payer: Medicare HMO

## 2021-10-13 ENCOUNTER — Other Ambulatory Visit: Payer: Self-pay

## 2021-10-13 ENCOUNTER — Encounter (HOSPITAL_COMMUNITY): Payer: Self-pay | Admitting: Family Medicine

## 2021-10-13 ENCOUNTER — Encounter (HOSPITAL_COMMUNITY)
Admission: EM | Disposition: A | Discharge status: Skilled Nursing Facility | Payer: Self-pay | Source: Home / Self Care | Attending: Family Medicine

## 2021-10-13 ENCOUNTER — Emergency Department (HOSPITAL_COMMUNITY): Payer: Medicare HMO

## 2021-10-13 DIAGNOSIS — Z79899 Other long term (current) drug therapy: Secondary | ICD-10-CM | POA: Diagnosis not present

## 2021-10-13 DIAGNOSIS — S72002D Fracture of unspecified part of neck of left femur, subsequent encounter for closed fracture with routine healing: Secondary | ICD-10-CM | POA: Diagnosis not present

## 2021-10-13 DIAGNOSIS — Z9981 Dependence on supplemental oxygen: Secondary | ICD-10-CM | POA: Diagnosis not present

## 2021-10-13 DIAGNOSIS — I110 Hypertensive heart disease with heart failure: Secondary | ICD-10-CM

## 2021-10-13 DIAGNOSIS — Z87891 Personal history of nicotine dependence: Secondary | ICD-10-CM

## 2021-10-13 DIAGNOSIS — F419 Anxiety disorder, unspecified: Secondary | ICD-10-CM | POA: Diagnosis present

## 2021-10-13 DIAGNOSIS — J449 Chronic obstructive pulmonary disease, unspecified: Secondary | ICD-10-CM | POA: Diagnosis present

## 2021-10-13 DIAGNOSIS — S72002A Fracture of unspecified part of neck of left femur, initial encounter for closed fracture: Secondary | ICD-10-CM | POA: Diagnosis present

## 2021-10-13 DIAGNOSIS — Z902 Acquired absence of lung [part of]: Secondary | ICD-10-CM | POA: Diagnosis not present

## 2021-10-13 DIAGNOSIS — J9611 Chronic respiratory failure with hypoxia: Secondary | ICD-10-CM | POA: Diagnosis present

## 2021-10-13 DIAGNOSIS — Z9221 Personal history of antineoplastic chemotherapy: Secondary | ICD-10-CM | POA: Diagnosis not present

## 2021-10-13 DIAGNOSIS — Z85118 Personal history of other malignant neoplasm of bronchus and lung: Secondary | ICD-10-CM | POA: Diagnosis not present

## 2021-10-13 DIAGNOSIS — Z8673 Personal history of transient ischemic attack (TIA), and cerebral infarction without residual deficits: Secondary | ICD-10-CM | POA: Diagnosis not present

## 2021-10-13 DIAGNOSIS — Y92009 Unspecified place in unspecified non-institutional (private) residence as the place of occurrence of the external cause: Secondary | ICD-10-CM | POA: Diagnosis not present

## 2021-10-13 DIAGNOSIS — I509 Heart failure, unspecified: Secondary | ICD-10-CM

## 2021-10-13 DIAGNOSIS — W010XXA Fall on same level from slipping, tripping and stumbling without subsequent striking against object, initial encounter: Secondary | ICD-10-CM | POA: Diagnosis present

## 2021-10-13 DIAGNOSIS — I10 Essential (primary) hypertension: Secondary | ICD-10-CM | POA: Diagnosis present

## 2021-10-13 DIAGNOSIS — Z923 Personal history of irradiation: Secondary | ICD-10-CM | POA: Diagnosis not present

## 2021-10-13 DIAGNOSIS — Z8616 Personal history of COVID-19: Secondary | ICD-10-CM | POA: Diagnosis not present

## 2021-10-13 DIAGNOSIS — Z7951 Long term (current) use of inhaled steroids: Secondary | ICD-10-CM | POA: Diagnosis not present

## 2021-10-13 HISTORY — PX: HIP PINNING,CANNULATED: SHX1758

## 2021-10-13 LAB — CBC
HCT: 41.5 % (ref 39.0–52.0)
Hemoglobin: 12.8 g/dL — ABNORMAL LOW (ref 13.0–17.0)
MCH: 26.7 pg (ref 26.0–34.0)
MCHC: 30.8 g/dL (ref 30.0–36.0)
MCV: 86.5 fL (ref 80.0–100.0)
Platelets: 136 10*3/uL — ABNORMAL LOW (ref 150–400)
RBC: 4.8 MIL/uL (ref 4.22–5.81)
RDW: 13.5 % (ref 11.5–15.5)
WBC: 9 10*3/uL (ref 4.0–10.5)
nRBC: 0 % (ref 0.0–0.2)

## 2021-10-13 LAB — COMPREHENSIVE METABOLIC PANEL
ALT: 12 U/L (ref 0–44)
AST: 14 U/L — ABNORMAL LOW (ref 15–41)
Albumin: 3.2 g/dL — ABNORMAL LOW (ref 3.5–5.0)
Alkaline Phosphatase: 65 U/L (ref 38–126)
Anion gap: 5 (ref 5–15)
BUN: 15 mg/dL (ref 8–23)
CO2: 26 mmol/L (ref 22–32)
Calcium: 7.7 mg/dL — ABNORMAL LOW (ref 8.9–10.3)
Chloride: 108 mmol/L (ref 98–111)
Creatinine, Ser: 0.73 mg/dL (ref 0.61–1.24)
GFR, Estimated: >60 mL/min (ref >60)
Glucose, Bld: 101 mg/dL — ABNORMAL HIGH (ref 70–99)
Potassium: 3.9 mmol/L (ref 3.5–5.1)
Sodium: 139 mmol/L (ref 135–145)
Total Bilirubin: 0.5 mg/dL (ref 0.3–1.2)
Total Protein: 6 g/dL — ABNORMAL LOW (ref 6.5–8.1)

## 2021-10-13 LAB — CBC WITH DIFFERENTIAL/PLATELET
Abs Immature Granulocytes: 0.05 10*3/uL (ref 0.00–0.07)
Basophils Absolute: 0.1 10*3/uL (ref 0.0–0.1)
Basophils Relative: 1 %
Eosinophils Absolute: 0.1 10*3/uL (ref 0.0–0.5)
Eosinophils Relative: 1 %
HCT: 47.6 % (ref 39.0–52.0)
Hemoglobin: 14.3 g/dL (ref 13.0–17.0)
Immature Granulocytes: 0 %
Lymphocytes Relative: 7 %
Lymphs Abs: 0.8 10*3/uL (ref 0.7–4.0)
MCH: 26.4 pg (ref 26.0–34.0)
MCHC: 30 g/dL (ref 30.0–36.0)
MCV: 88 fL (ref 80.0–100.0)
Monocytes Absolute: 1.2 10*3/uL — ABNORMAL HIGH (ref 0.1–1.0)
Monocytes Relative: 10 %
Neutro Abs: 9.7 10*3/uL — ABNORMAL HIGH (ref 1.7–7.7)
Neutrophils Relative %: 81 %
Platelets: 149 10*3/uL — ABNORMAL LOW (ref 150–400)
RBC: 5.41 MIL/uL (ref 4.22–5.81)
RDW: 13.6 % (ref 11.5–15.5)
WBC: 12 10*3/uL — ABNORMAL HIGH (ref 4.0–10.5)
nRBC: 0 % (ref 0.0–0.2)

## 2021-10-13 LAB — URINALYSIS, ROUTINE W REFLEX MICROSCOPIC
Bilirubin Urine: NEGATIVE
Glucose, UA: NEGATIVE mg/dL
Hgb urine dipstick: NEGATIVE
Ketones, ur: 20 mg/dL — AB
Leukocytes,Ua: NEGATIVE
Nitrite: NEGATIVE
Protein, ur: NEGATIVE mg/dL
Specific Gravity, Urine: 1.012 (ref 1.005–1.030)
pH: 5 (ref 5.0–8.0)

## 2021-10-13 LAB — BASIC METABOLIC PANEL
Anion gap: 9 (ref 5–15)
BUN: 15 mg/dL (ref 8–23)
CO2: 26 mmol/L (ref 22–32)
Calcium: 8.2 mg/dL — ABNORMAL LOW (ref 8.9–10.3)
Chloride: 104 mmol/L (ref 98–111)
Creatinine, Ser: 0.9 mg/dL (ref 0.61–1.24)
GFR, Estimated: >60 mL/min (ref >60)
Glucose, Bld: 104 mg/dL — ABNORMAL HIGH (ref 70–99)
Potassium: 4.4 mmol/L (ref 3.5–5.1)
Sodium: 139 mmol/L (ref 135–145)

## 2021-10-13 LAB — SURGICAL PCR SCREEN
MRSA, PCR: NEGATIVE
Staphylococcus aureus: NEGATIVE

## 2021-10-13 LAB — PROTIME-INR
INR: 1 (ref 0.8–1.2)
Prothrombin Time: 13.1 seconds (ref 11.4–15.2)

## 2021-10-13 SURGERY — PERCUTANEOUS FIXATION OF FEMORAL NECK
Anesthesia: Spinal | Site: Hip | Laterality: Left

## 2021-10-13 MED ORDER — ROCURONIUM BROMIDE 10 MG/ML (PF) SYRINGE
PREFILLED_SYRINGE | INTRAVENOUS | Status: AC
  Filled 2021-10-13: qty 10

## 2021-10-13 MED ORDER — ALBUTEROL SULFATE (2.5 MG/3ML) 0.083% IN NEBU: 2.5000 mg | INHALATION_SOLUTION | Freq: Four times a day (QID) | RESPIRATORY_TRACT | Status: DC | PRN

## 2021-10-13 MED ORDER — HYDROMORPHONE HCL 1 MG/ML IJ SOLN: 0.2500 mg | INTRAMUSCULAR | Status: DC | PRN

## 2021-10-13 MED ORDER — PANTOPRAZOLE SODIUM 40 MG PO TBEC
40.0000 mg | DELAYED_RELEASE_TABLET | Freq: Every day | ORAL | Status: DC
  Administered 2021-10-14 – 2021-10-17 (×4): 40 mg via ORAL
  Filled 2021-10-13 (×5): qty 1

## 2021-10-13 MED ORDER — LACTATED RINGERS IV SOLN: INTRAVENOUS | Status: DC

## 2021-10-13 MED ORDER — TRANEXAMIC ACID-NACL 1000-0.7 MG/100ML-% IV SOLN
1000.0000 mg | INTRAVENOUS | Status: AC
  Administered 2021-10-13: 1000 mg via INTRAVENOUS
  Filled 2021-10-13: qty 100

## 2021-10-13 MED ORDER — BUPIVACAINE HCL (PF) 0.5 % IJ SOLN
INTRAMUSCULAR | Status: AC
  Filled 2021-10-13: qty 30

## 2021-10-13 MED ORDER — DEXAMETHASONE SODIUM PHOSPHATE 10 MG/ML IJ SOLN
INTRAMUSCULAR | Status: DC | PRN
  Administered 2021-10-13: 4 mg via INTRAVENOUS

## 2021-10-13 MED ORDER — ACETAMINOPHEN 325 MG PO TABS
650.0000 mg | ORAL_TABLET | Freq: Four times a day (QID) | ORAL | Status: DC | PRN
  Administered 2021-10-13: 650 mg via ORAL
  Filled 2021-10-13 (×2): qty 2

## 2021-10-13 MED ORDER — MORPHINE SULFATE (PF) 2 MG/ML IV SOLN
2.0000 mg | INTRAVENOUS | Status: DC | PRN
  Administered 2021-10-13 (×2): 2 mg via INTRAVENOUS
  Filled 2021-10-13 (×2): qty 1

## 2021-10-13 MED ORDER — MUPIROCIN 2 % EX OINT
1.0000 | TOPICAL_OINTMENT | Freq: Two times a day (BID) | CUTANEOUS | Status: DC
  Administered 2021-10-13 – 2021-10-17 (×8): 1 via NASAL
  Filled 2021-10-13: qty 22

## 2021-10-13 MED ORDER — ONDANSETRON HCL 4 MG/2ML IJ SOLN
INTRAMUSCULAR | Status: AC
  Filled 2021-10-13: qty 2

## 2021-10-13 MED ORDER — THEOPHYLLINE ER 400 MG PO TB24
400.0000 mg | ORAL_TABLET | Freq: Every day | ORAL | Status: DC
  Administered 2021-10-13 – 2021-10-17 (×5): 400 mg via ORAL
  Filled 2021-10-13 (×7): qty 1

## 2021-10-13 MED ORDER — HYDRALAZINE HCL 20 MG/ML IJ SOLN: 10.0000 mg | Freq: Four times a day (QID) | INTRAMUSCULAR | Status: DC | PRN

## 2021-10-13 MED ORDER — ORAL CARE MOUTH RINSE: 15.0000 mL | Freq: Once | OROMUCOSAL | Status: AC

## 2021-10-13 MED ORDER — CHLORHEXIDINE GLUCONATE 0.12 % MT SOLN
15.0000 mL | Freq: Once | OROMUCOSAL | Status: AC
  Administered 2021-10-13: 15 mL via OROMUCOSAL

## 2021-10-13 MED ORDER — MORPHINE SULFATE (PF) 4 MG/ML IV SOLN
4.0000 mg | Freq: Once | INTRAVENOUS | Status: AC
  Administered 2021-10-13: 4 mg via INTRAVENOUS
  Filled 2021-10-13: qty 1

## 2021-10-13 MED ORDER — CEFAZOLIN SODIUM-DEXTROSE 2-4 GM/100ML-% IV SOLN
2.0000 g | Freq: Three times a day (TID) | INTRAVENOUS | Status: AC
  Administered 2021-10-13 – 2021-10-14 (×3): 2 g via INTRAVENOUS
  Filled 2021-10-13 (×3): qty 100

## 2021-10-13 MED ORDER — ACETAMINOPHEN 650 MG RE SUPP: 650.0000 mg | Freq: Four times a day (QID) | RECTAL | Status: DC | PRN

## 2021-10-13 MED ORDER — FLUTICASONE PROPIONATE 50 MCG/ACT NA SUSP
1.0000 | Freq: Every day | NASAL | Status: DC
  Administered 2021-10-13 – 2021-10-17 (×5): 1 via NASAL
  Filled 2021-10-13: qty 16

## 2021-10-13 MED ORDER — ONDANSETRON HCL 4 MG PO TABS: 4.0000 mg | ORAL_TABLET | Freq: Four times a day (QID) | ORAL | Status: DC | PRN

## 2021-10-13 MED ORDER — METHOCARBAMOL 1000 MG/10ML IJ SOLN
500.0000 mg | Freq: Four times a day (QID) | INTRAVENOUS | Status: DC | PRN
  Administered 2021-10-14: 500 mg via INTRAVENOUS
  Filled 2021-10-13 (×2): qty 5

## 2021-10-13 MED ORDER — SUCCINYLCHOLINE CHLORIDE 200 MG/10ML IV SOSY
PREFILLED_SYRINGE | INTRAVENOUS | Status: AC
  Filled 2021-10-13: qty 10

## 2021-10-13 MED ORDER — METHOCARBAMOL 1000 MG/10ML IJ SOLN
INTRAMUSCULAR | Status: AC
  Filled 2021-10-13: qty 10

## 2021-10-13 MED ORDER — ONDANSETRON HCL 4 MG/2ML IJ SOLN
4.0000 mg | Freq: Four times a day (QID) | INTRAMUSCULAR | Status: DC | PRN
  Administered 2021-10-13 – 2021-10-16 (×3): 4 mg via INTRAVENOUS
  Filled 2021-10-13 (×4): qty 2

## 2021-10-13 MED ORDER — ALPRAZOLAM 0.5 MG PO TABS
0.5000 mg | ORAL_TABLET | Freq: Every day | ORAL | Status: DC
  Administered 2021-10-13 – 2021-10-17 (×5): 0.5 mg via ORAL
  Filled 2021-10-13 (×5): qty 1

## 2021-10-13 MED ORDER — METOPROLOL TARTRATE 50 MG PO TABS
50.0000 mg | ORAL_TABLET | Freq: Two times a day (BID) | ORAL | Status: DC
  Administered 2021-10-13 – 2021-10-17 (×9): 50 mg via ORAL
  Filled 2021-10-13 (×9): qty 1

## 2021-10-13 MED ORDER — PROPOFOL 500 MG/50ML IV EMUL
INTRAVENOUS | Status: DC | PRN
  Administered 2021-10-13: 50 ug/kg/min via INTRAVENOUS

## 2021-10-13 MED ORDER — PROPOFOL 10 MG/ML IV BOLUS
INTRAVENOUS | Status: DC | PRN
  Administered 2021-10-13 (×2): 20 mg via INTRAVENOUS

## 2021-10-13 MED ORDER — MORPHINE SULFATE (PF) 2 MG/ML IV SOLN
2.0000 mg | INTRAVENOUS | Status: DC | PRN
  Administered 2021-10-14 (×2): 2 mg via INTRAVENOUS
  Filled 2021-10-13: qty 1
  Filled 2021-10-13: qty 2

## 2021-10-13 MED ORDER — FENTANYL CITRATE (PF) 100 MCG/2ML IJ SOLN
INTRAMUSCULAR | Status: AC
  Filled 2021-10-13: qty 2

## 2021-10-13 MED ORDER — GABAPENTIN 300 MG PO CAPS
600.0000 mg | ORAL_CAPSULE | Freq: Two times a day (BID) | ORAL | Status: DC
  Administered 2021-10-13 – 2021-10-17 (×9): 600 mg via ORAL
  Filled 2021-10-13 (×9): qty 2

## 2021-10-13 MED ORDER — THEOPHYLLINE ER 200 MG PO TB12
200.0000 mg | ORAL_TABLET | Freq: Two times a day (BID) | ORAL | Status: DC
  Filled 2021-10-13 (×7): qty 1

## 2021-10-13 MED ORDER — IPRATROPIUM-ALBUTEROL 0.5-2.5 (3) MG/3ML IN SOLN
3.0000 mL | Freq: Two times a day (BID) | RESPIRATORY_TRACT | Status: DC
Start: 2021-10-13 — End: 2021-10-17
  Administered 2021-10-13 – 2021-10-17 (×9): 3 mL via RESPIRATORY_TRACT
  Filled 2021-10-13 (×9): qty 3

## 2021-10-13 MED ORDER — SUGAMMADEX SODIUM 500 MG/5ML IV SOLN
INTRAVENOUS | Status: AC
  Filled 2021-10-13: qty 5

## 2021-10-13 MED ORDER — BUPIVACAINE IN DEXTROSE 0.75-8.25 % IT SOLN
INTRATHECAL | Status: DC | PRN
  Administered 2021-10-13: 2 mL via INTRATHECAL

## 2021-10-13 MED ORDER — KCL IN DEXTROSE-NACL 20-5-0.9 MEQ/L-%-% IV SOLN: INTRAVENOUS | Status: DC

## 2021-10-13 MED ORDER — PHENYLEPHRINE HCL-NACL 20-0.9 MG/250ML-% IV SOLN
INTRAVENOUS | Status: AC
  Filled 2021-10-13: qty 250

## 2021-10-13 MED ORDER — DEXAMETHASONE SODIUM PHOSPHATE 10 MG/ML IJ SOLN
INTRAMUSCULAR | Status: AC
  Filled 2021-10-13: qty 1

## 2021-10-13 MED ORDER — HEPARIN SODIUM (PORCINE) 5000 UNIT/ML IJ SOLN
5000.0000 [IU] | Freq: Three times a day (TID) | INTRAMUSCULAR | Status: AC
  Administered 2021-10-13 – 2021-10-15 (×7): 5000 [IU] via SUBCUTANEOUS
  Filled 2021-10-13 (×6): qty 1

## 2021-10-13 MED ORDER — CEFAZOLIN SODIUM-DEXTROSE 2-4 GM/100ML-% IV SOLN
2.0000 g | INTRAVENOUS | Status: AC
  Administered 2021-10-13: 2 g via INTRAVENOUS
  Filled 2021-10-13: qty 100

## 2021-10-13 MED ORDER — PHENYLEPHRINE 80 MCG/ML (10ML) SYRINGE FOR IV PUSH (FOR BLOOD PRESSURE SUPPORT)
PREFILLED_SYRINGE | INTRAVENOUS | Status: AC
  Filled 2021-10-13: qty 10

## 2021-10-13 MED ORDER — OXYCODONE HCL 5 MG PO TABS
5.0000 mg | ORAL_TABLET | ORAL | Status: DC | PRN
  Administered 2021-10-13 – 2021-10-17 (×14): 5 mg via ORAL
  Filled 2021-10-13 (×15): qty 1

## 2021-10-13 MED ORDER — UMECLIDINIUM BROMIDE 62.5 MCG/ACT IN AEPB
1.0000 | INHALATION_SPRAY | Freq: Every day | RESPIRATORY_TRACT | Status: DC
  Administered 2021-10-13 – 2021-10-17 (×5): 1 via RESPIRATORY_TRACT
  Filled 2021-10-13: qty 7

## 2021-10-13 MED ORDER — LACTATED RINGERS IV BOLUS
1000.0000 mL | Freq: Once | INTRAVENOUS | Status: AC
  Administered 2021-10-13: 1000 mL via INTRAVENOUS

## 2021-10-13 MED ORDER — 0.9 % SODIUM CHLORIDE (POUR BTL) OPTIME
TOPICAL | Status: DC | PRN
  Administered 2021-10-13: 1000 mL

## 2021-10-13 MED ORDER — ONDANSETRON HCL 4 MG/2ML IJ SOLN
INTRAMUSCULAR | Status: DC | PRN
  Administered 2021-10-13: 4 mg via INTRAVENOUS

## 2021-10-13 MED ORDER — FENTANYL CITRATE (PF) 250 MCG/5ML IJ SOLN
INTRAMUSCULAR | Status: DC | PRN
  Administered 2021-10-13: 50 ug via INTRAVENOUS

## 2021-10-13 SURGICAL SUPPLY — 44 items
BAG HAMPER (MISCELLANEOUS) ×2
BLADE SURG SZ10 CARB STEEL (BLADE) ×4
BNDG GAUZE ELAST 4 BULKY (GAUZE/BANDAGES/DRESSINGS) ×2
CHLORAPREP W/TINT 26 (MISCELLANEOUS) ×4
CLOTH BEACON ORANGE TIMEOUT ST (SAFETY) ×2
COVER LIGHT HANDLE STERIS (MISCELLANEOUS) ×4
COVER MAYO STAND XLG (MISCELLANEOUS) ×2
COVER PERINEAL POST (MISCELLANEOUS) ×2
DRAPE STERI IOBAN 125X83 (DRAPES) ×2
DRSG MEPILEX SACRM 8.7X9.8 (GAUZE/BANDAGES/DRESSINGS) ×2
DRSG TEGADERM 4X4.75 (GAUZE/BANDAGES/DRESSINGS) ×2
GAUZE SPONGE 4X4 12PLY STRL (GAUZE/BANDAGES/DRESSINGS) ×2
GLOVE BIOGEL PI IND STRL 7.0 (GLOVE) ×2
GLOVE BIOGEL PI INDICATOR 7.0 (GLOVE) ×2
GLOVE SRG 8 PF TXTR STRL LF DI (GLOVE) ×1
GLOVE SURG POLYISO LF SZ8 (GLOVE) ×4
GLOVE SURG UNDER POLY LF SZ8 (GLOVE) ×2
GOWN STRL REUS W/ TWL XL LVL3 (GOWN DISPOSABLE) ×1
GOWN STRL REUS W/TWL LRG LVL3 (GOWN DISPOSABLE) ×4
GOWN STRL REUS W/TWL XL LVL3 (GOWN DISPOSABLE) ×2
INST SET MAJOR BONE (KITS) ×2
KIT BLADEGUARD II DBL (SET/KITS/TRAYS/PACK) ×2
KIT TURNOVER CYSTO (KITS) ×2
MANIFOLD NEPTUNE II (INSTRUMENTS) ×2
MARKER SKIN DUAL TIP RULER LAB (MISCELLANEOUS) ×2
NS IRRIG 1000ML POUR BTL (IV SOLUTION) ×2
PACK BASIC III (CUSTOM PROCEDURE TRAY) ×2
PACK SRG BSC III STRL LF ECLPS (CUSTOM PROCEDURE TRAY) ×1
PAD ABD 5X9 TENDERSORB (GAUZE/BANDAGES/DRESSINGS) ×2
PENCIL SMOKE EVACUATOR COATED (MISCELLANEOUS) ×2
PIN GUIDE THRD AR 3.2X330 (PIN) ×6
SCREW BONE 7.0X85 (Screw) ×2 IMPLANT
SCREW CANN PT 7X90 (Screw) ×4 IMPLANT
SET BASIN LINEN APH (SET/KITS/TRAYS/PACK) ×2
SPONGE T-LAP 18X18 ~~LOC~~+RFID (SPONGE) ×4
STRIP CLOSURE SKIN 1/2X4 (GAUZE/BANDAGES/DRESSINGS) ×4
SUT MNCRL AB 4-0 PS2 18 (SUTURE) ×2
SUT MON AB 2-0 CT1 36 (SUTURE) ×2
SUT VIC AB 0 CT1 27 (SUTURE) ×2
SUT VIC AB 0 CT1 27XBRD ANTBC (SUTURE) ×1
SYR 30ML LL (SYRINGE) ×2
SYR BULB IRRIG 60ML STRL (SYRINGE) ×4
WASHER FLAT 7.0 (Washer) ×6 IMPLANT
YANKAUER SUCT BULB TIP 10FT TU (MISCELLANEOUS) ×2

## 2021-10-13 NOTE — Op Note (Deleted)
ORTHOPAEDIC CONSULTATION  REQUESTING PHYSICIAN: Russella Dar, NP  ASSESSMENT AND PLAN: 76 y.o. male with the following: Left Hip Non displaced femoral neck fracture  This patient requires inpatient admission to the hospitalist, to include preoperative clearance and perioperative medical management  - Weight Bearing Status/Activity: NWB Left lower extremity  - Additional recommended labs/tests: Preop Labs: CBC, BMP, PT/INR, Chest XR, and EKG  -VTE Prophylaxis: Please hold prior to OR; to resume POD#1 at the discretion of the primary team  - Pain control: Recommend PO pain medications PRN; judicious use of narcotics  - Follow-up plan: F/u 10-14 days postop  -Procedures: Plan for OR once patient has been medically optimized  Plan for Left hip CRPP  Keep patient NPO   DOS: 10/13/2021    Chief Complaint: Left hip pain  HPI: Stephen Fitzpatrick is a 76 y.o. male who presented to the ED for evaluation after sustaining a mechanical fall. Yesterday, when he stepped on the pillow, tripped and fell.  He landed on the left side.  He immediate pain.  He was brought to the emergency department.  He continues to have pain in the [place] left hip.  When he is not moving, his pain is controlled.  No numbness or tingling.  He has a history of lung cancer, had a portion of his neck.  He is on 4 L of oxygen at baseline.  No blood thinners.   Past Medical History:  Diagnosis Date   Adenocarcinoma of lung (HCC)    CHF (congestive heart failure) (HCC)    COPD (chronic obstructive pulmonary disease) (HCC)    Mass of lung    Nodule of left lung    PONV (postoperative nausea and vomiting)    Past Surgical History:  Procedure Laterality Date   BACK SURGERY     Left video-assisted thoracic surgery, resection  08/24/10   Dr Edwyna Shell   Social History   Socioeconomic History   Marital status: Married    Spouse name: Not on file   Number of children: Not on file   Years of education: Not on  file   Highest education level: Not on file  Occupational History   Not on file  Tobacco Use   Smoking status: Former    Types: Cigarettes    Quit date: 08/24/2010    Years since quitting: 11.1   Smokeless tobacco: Never  Substance and Sexual Activity   Alcohol use: No   Drug use: No   Sexual activity: Never  Other Topics Concern   Not on file  Social History Narrative   Not on file   Social Determinants of Health   Financial Resource Strain: Not on file  Food Insecurity: Not on file  Transportation Needs: Not on file  Physical Activity: Not on file  Stress: Not on file  Social Connections: Not on file   No family history on file. No Known Allergies Prior to Admission medications   Medication Sig Start Date End Date Taking? Authorizing Provider  acetaminophen (TYLENOL) 325 MG tablet Take 650 mg by mouth every 6 (six) hours as needed for mild pain.   Yes [provider]  Aclidinium Bromide (TUDORZA PRESSAIR) 400 MCG/ACT AEPB Inhale 400 mcg into the lungs 2 (two) times daily. 02/09/13  Yes Deatra Canter, FNP  albuterol (PROVENTIL) (2.5 MG/3ML) 0.083% nebulizer solution Take 3 mLs (2.5 mg total) by nebulization every 6 (six) hours as needed for wheezing or shortness of breath. 03/26/15  Yes Triplett, Tammy,  PA-C  ALPRAZolam (XANAX) 0.5 MG tablet Take 0.5 mg by mouth daily.   Yes [provider]  budesonide (RHINOCORT ALLERGY) 32 MCG/ACT nasal spray Place 1 spray into both nostrils daily.   Yes [provider]  ipratropium-albuterol (DUONEB) 0.5-2.5 (3) MG/3ML SOLN Take 3 mLs by nebulization 2 (two) times daily. 07/16/12  Yes Chipper Herb, MD  metoprolol (LOPRESSOR) 50 MG tablet TAKE 1 TABLET TWICE A DAY 11/22/12  Yes Hassell Done, Mary-Margaret, FNP  benzonatate (TESSALON) 200 MG capsule Take 1 capsule (200 mg total) by mouth 3 (three) times daily. 02/28/12   Monika Salk, MD  chlorpheniramine-HYDROcodone (TUSSIONEX PENNKINETIC ER) 10-8 MG/5ML LQCR Take 5  mLs by mouth every 12 (twelve) hours as needed. 09/01/11   Lajean Saver, MD  Fluticasone-Salmeterol (ADVAIR) 100-50 MCG/DOSE AEPB Inhale 1 puff into the lungs every 12 (twelve) hours.    [provider]  gabapentin (NEURONTIN) 600 MG tablet Take 600 mg by mouth 2 (two) times daily.  02/01/13   [provider]  oxycodone (ROXICODONE) 30 MG immediate release tablet Take 30 mg by mouth every 4 (four) hours as needed for pain.    [provider]  Oxycodone HCl 20 MG TABS Take 1 tablet (20 mg total) by mouth every 4 (four) hours as needed. 02/28/12   Monika Salk, MD  predniSONE (DELTASONE) 20 MG tablet Take 3 tablets (60 mg total) by mouth daily with breakfast. 10/04/17   Pollina, Gwenyth Allegra, MD  theophylline (THEODUR) 200 MG 12 hr tablet Take 1 tablet (200 mg total) by mouth 2 (two) times daily.    Lysbeth Penner, FNP   DG Chest Port 1 View  Result Date: 10/13/2021 CLINICAL DATA:  Preop evaluation for upcoming hip surgery EXAM: PORTABLE CHEST 1 VIEW COMPARISON:  10/04/2017, CT from 02/25/2012 FINDINGS: Cardiac shadow is within normal limits. Aortic calcifications are seen. Lungs are hyperinflated with diffuse emphysematous changes. Some architectural distortion is noted in the left perihilar region slightly greater than that seen on the prior exam which may represent some progressive scarring. No sizable effusion is seen. No focal confluent infiltrate is noted. IMPRESSION: Progressive scarring in the left perihilar region likely related to known history of lung carcinoma and treatment. Need for further evaluation can be determined on a clinical basis. Emphysematous changes. Electronically Signed   By: Inez Catalina M.D.   On: 10/13/2021 01:24   DG Hip Unilat W or Wo Pelvis 2-3 Views Left  Result Date: 10/12/2021 CLINICAL DATA:  s/p fall EXAM: DG HIP (WITH OR WITHOUT PELVIS) 2-3V LEFT COMPARISON:  None Available. FINDINGS: Poorly visualized acute impacted and displaced left  femoral neck fracture. Frontal view of the right hip grossly unremarkable. No evidence of acute displaced fracture or diastasis of the bones of the pelvis. There is no evidence of severe arthropathy or other focal bone abnormality. Atherosclerotic plaque. IMPRESSION: 1. Poorly visualized acute impacted and displaced left femoral neck fracture 2.  Frontal view of the right hip grossly unremarkable. 3.  Aortic Atherosclerosis (ICD10-I70.0). Electronically Signed   By: Iven Finn M.D.   On: 10/12/2021 23:33    Family History Reviewed and non-contributory, no pertinent history of problems with bleeding or anesthesia    Review of Systems No fevers or chills No numbness or tingling No chest pain + shortness of breath No bowel or bladder dysfunction No GI distress No headaches No nausea No vomiting No loss of consciousness.   OBJECTIVE  Vitals:Patient Vitals for the past 8  hrs:  BP Temp Temp src Pulse Resp SpO2 Height Weight  10/13/21 0826 -- -- -- -- -- 100 % -- --  10/13/21 0821 -- -- -- -- -- 100 % -- --  10/13/21 0748 (!) 151/97 97.9 F (36.6 C) -- 87 18 98 % -- --  10/13/21 0450 (!) 158/87 -- -- 79 16 100 % -- --  10/13/21 0310 (!) 191/101 97.7 F (36.5 C) Oral 80 19 99 % $Re'6\' 3"'jaW$  (1.905 m) 75.1 kg  10/13/21 0200 (!) 164/88 98 F (36.7 C) -- 86 12 98 % -- --   General: Alert, no acute distress Cardiovascular: Extremities are warm Respiratory: No cyanosis, no use of accessory musculature Skin: No lesions in the area of chief complaint  Neurologic: Sensation intact distally  Psychiatric: Patient is competent for consent with normal mood and affect Lymphatic: No swelling obvious and reported other than the area involved in the exam below Extremities  LLE: Extremity held in a fixed position.  ROM deferred due to known fracture.  Sensation is intact distally in the sural, saphenous, DP, SP, and plantar nerve distribution. 2+ DP pulse.  Toes are WWP.  Active motion intact in the  TA/EHL/GS. RLE: Sensation is intact distally in the sural, saphenous, DP, SP, and plantar nerve distribution. 2+ DP pulse.  Toes are WWP.  Active motion intact in the TA/EHL/GS. Tolerates gentle ROM of the hip.  No pain with axial loading.     Test Results Imaging XR of the Left hip demonstrates a Non displaced femoral neck fracture.  Labs cbc Recent Labs    10/13/21 0015 10/13/21 0518  WBC 12.0* 9.0  HGB 14.3 12.8*  HCT 47.6 41.5  PLT 149* 136*    Labs inflam No results for input(s): "CRP" in the last 72 hours.  Invalid input(s): "ESR"  Labs coag Recent Labs    10/13/21 0015  INR 1.0    Recent Labs    10/13/21 0015 10/13/21 0518  NA 139 139  K 4.4 3.9  CL 104 108  CO2 26 26  GLUCOSE 104* 101*  BUN 15 15  CREATININE 0.90 0.73  CALCIUM 8.2* 7.7*

## 2021-10-13 NOTE — H&P (Addendum)
History and Physical    Patient: Stephen Fitzpatrick:[id number] DOB: 1945-11-23 DOA: 10/12/2021 DOS: the patient was seen and examined on 10/13/2021 PCP: Adaline Sill, NP  Patient coming from: Home  Chief Complaint:  Chief Complaint  Patient presents with   Fall    Left hip pain   HPI: Stephen Fitzpatrick is a 76 y.o. male with medical history significant of O2 dependent COPD with baseline requirement 4 L/min, hypertension with elevated LVEF but no evidence of diastolic dysfunction on echo 2021, adenocarcinoma of the left lung status post chemoradiation and left VATS procedure in 2012, history of COVID in 2021, prior tobacco use stopped in 2021, incidental finding of ischemic CVA 2012.  Presented with left thigh pain after accidental fall at home.  No syncope or presyncopal symptoms, no shortness of breath or chest pain preceding fall.  In the ER it was revealed that patient had to impacted and displaced left femoral neck fracture.  Orthopedic physician Dr. Colon Branch consulted by Dr. Lanny Cramp in the ER and patient will undergo surgical repair later today.  He is currently n.p.o.   Review of Systems: As mentioned in the history of present illness. All other systems reviewed and are negative. Past Medical History:  Diagnosis Date   Adenocarcinoma of lung (HCC)    CHF (congestive heart failure) (HCC)    COPD (chronic obstructive pulmonary disease) (HCC)    Mass of lung    Nodule of left lung    PONV (postoperative nausea and vomiting)    Past Surgical History:  Procedure Laterality Date   BACK SURGERY     Left video-assisted thoracic surgery, resection  08/24/10   Dr Arlyce Dice   Social History:  reports that he quit smoking about 11 years ago. His smoking use included cigarettes. He has never used smokeless tobacco. He reports that he does not drink alcohol and does not use drugs.  No Known Allergies  No family history on file.  Prior to Admission medications   Medication Sig Start Date  End Date Taking? Authorizing Provider  acetaminophen (TYLENOL) 325 MG tablet Take 650 mg by mouth every 6 (six) hours as needed for mild pain.   Yes [provider]  albuterol (PROVENTIL) (2.5 MG/3ML) 0.083% nebulizer solution Take 3 mLs (2.5 mg total) by nebulization every 6 (six) hours as needed for wheezing or shortness of breath. 03/26/15  Yes Triplett, Tammy, PA-C  albuterol (VENTOLIN HFA) 108 (90 Base) MCG/ACT inhaler Inhale 2 puffs into the lungs every 4 (four) hours as needed for wheezing or shortness of breath. 12/18/19  Yes [provider]  ALPRAZolam Duanne Moron) 0.5 MG tablet Take 0.5 mg by mouth daily.   Yes [provider]  budesonide (RHINOCORT ALLERGY) 32 MCG/ACT nasal spray Place 1 spray into both nostrils daily.   Yes [provider]  Fluticasone-Umeclidin-Vilant (TRELEGY ELLIPTA) 100-62.5-25 MCG/ACT AEPB Inhale 1 puff into the lungs daily.   Yes [provider]  metoprolol (LOPRESSOR) 50 MG tablet TAKE 1 TABLET TWICE A DAY 11/22/12  Yes Hassell Done, Mary-Margaret, FNP  Aclidinium Bromide (TUDORZA PRESSAIR) 400 MCG/ACT AEPB Inhale 400 mcg into the lungs 2 (two) times daily. Patient not taking: Reported on 10/13/2021 02/09/13   Lysbeth Penner, FNP  gabapentin (NEURONTIN) 600 MG tablet Take 600 mg by mouth 2 (two) times daily.  Patient not taking: Reported on 10/13/2021 02/01/13   [provider]  theophylline (THEODUR) 200 MG 12 hr tablet Take 1 tablet (200 mg total) by mouth 2 (  two) times daily. Patient not taking: Reported on 10/13/2021    Lysbeth Penner, FNP    Physical Exam: Vitals:   10/13/21 0450 10/13/21 0748 10/13/21 0821 10/13/21 0826  BP: (!) 158/87 (!) 151/97    Pulse: 79 87    Resp: 16 18    Temp:  97.9 F (36.6 C)    TempSrc:      SpO2: 100% 98% 100% 100%  Weight:      Height:       Constitutional: NAD, calm, slightly uncomfortable secondary to acute left thigh pain Eyes: PERRL, lids and conjunctivae normal ENMT:  Mucous membranes are moist. Posterior pharynx clear of any exudate or lesions.  Extremely poor dentition.  Multiple dental caries and missing teeth. Neck: normal, supple, no masses, no thyromegaly Respiratory: clear to auscultation bilaterally, no wheezing, no crackles. Normal respiratory effort. No accessory muscle use.  4 L O2 Cardiovascular: Regular rate and rhythm, no murmurs / rubs / gallops. No extremity edema.  Lower extremities cool to the touch and pulses difficult to palpate.  No carotid bruits.  Abdomen: no tenderness, no masses palpated. No hepatosplenomegaly. Bowel sounds positive.  Musculoskeletal: no clubbing / cyanosis. No joint deformity upper and lower extremities including affected lower extremity.  No swelling in left upper extremity and no bruising.   Skin: no rashes, lesions, ulcers. No induration Neurologic: CN 2-12 grossly intact. Sensation intact, DTR not tested strength 5/5 x all 4 extremities except for left lower extremity that was not tested due to pain from acute fracture.  Psychiatric: Normal judgment and insight. Alert and oriented x 3. Normal mood.   Data Reviewed:   Labs Sodium 139, potassium 4.4, glucose 104, BUN 15, creatinine 0.9, GFR greater than 60, white count 12,000 with slightly elevated neutrophils at 81 hemoglobin 14.3, PT and INR normal limits, urinalysis unremarkable except for small amount of ketones in the urine, MRSA and staph PCR negative  EKG  sinus rhythm with ventricular rate 84 bpm, QTc 493 ms, no acute changes, prominent T waves V5 V6, slightly delayed R wave progression in the septal leads, T wave inversion aVL, no acute ischemic changes.  Plain x-ray hip and pelvis  poorly visualized acute impacted and displaced left femoral neck fracture otherwise unremarkable  Portable chest x-ray: Progressive scarring in left perihilar region suspected related to known history of lung cancer and prior treatment.  Assessment and Plan: Left hip  fracture Appreciate assistance of orthopedic team-medically to proceed with surgical procedure today  Lyndel Safe perioperative risk calculated at 0.77% probability for perioperative myocardial infarction or cardiac arrest) (Revised cardiac risk index/Lee criteria calculated at 0.9% which is low risk in regards to estimated rate of myocardial infarction, pulmonary edema, V-fib, cardiac arrest or complete heart block) NPO until surgical procedure IV analgesics until postop then transition to oral PT evaluation postop Risk and benefit of surgery explained to patient and son by orthopedic team Made aware of pending his ability to participate with therapy after surgery will determine his discharge disposition either to SNF for short-term rehab versus home with home health and transitioning to outpatient PT N.p.o.-patient complaining of thirst Repeat electrolytes and CBC in the morning   O2 dependent COPD On O2 needs 4 L/min Patient states was on Trelegy once daily at home -Incruse Ellipta ordered here-patient no longer taking Tudorza Patient states was on nebulizer as well as a rescue inhaler at home.  Duo nebs ordered here 2 times daily Proventil ordered as as needed/rescue bronchodilator Continue theophylline  as per at home Continue Xanax for COPD related anxiety Echo cardiogram in 2021 reveals normal RV systolic function and normal tricuspid valve without any evidence of pulmonary hypertension  Chronic respiratory failure with hypoxia Continue O2 as documented above  History of adenocarcinoma left lung 2012 status post chemoradiation and VATS procedure Stable and no acute issues  Hypertension Blood pressure modestly elevated in the setting of acute hip pain from fracture Continue Lopressor IV Apresoline ordered for excessive breakthrough hypertension 2D echocardiogram performed at outside hospital in 2021 reveals hyperdynamic EF 65 to 70% but no LVH.  At this time patient does not meet  criteria for HFpEF but be careful with volume overload since he could likely have an episode of symptomatic edema  History of COVID 2012  History of ischemic CVA Incidental finding on surveillance CT and initial cancer diagnosis  GERD Was not taking H2 blocker or PPI prior to admission We will begin oral PPI for ulcer prophylaxis on 7/8   DVT prophylaxis: Subcutaneous heparin and SCDs  Advance Care Planning:   Code Status: Full Code   Consults: Dr. Elayne Snare  Family Communication: Son at bedside  Severity of Illness: The appropriate patient status for this patient is INPATIENT. Inpatient status is judged to be reasonable and necessary in order to provide the required intensity of service to ensure the patient's safety. The patient's presenting symptoms, physical exam findings, and initial radiographic and laboratory data in the context of their chronic comorbidities is felt to place them at high risk for further clinical deterioration. Furthermore, it is not anticipated that the patient will be medically stable for discharge from the hospital within 2 midnights of admission.   * I certify that at the point of admission it is my clinical judgment that the patient will require inpatient hospital care spanning beyond 2 midnights from the point of admission due to high intensity of service, high risk for further deterioration and high frequency of surveillance required.*  Author: Erin Hearing, NP 10/13/2021 10:41 AM  For on call review [email or web address].     Patient seen and examined with Erin Hearing, NP. In addition to supervising the encounter, I played a key role in the decision making process as well as reviewed key findings.  Pt admitted with acute hip fracture from fall and he was medically cleared to have surgery with Dr. Amedeo Kinsman today for operative repair.  Please see orders.  Will follow closely postop and work with TOC on disposition.    Gerlene Fee, MD How to contact  the [facility] Attending or Consulting provider [place] or covering provider during after hours [place], for this patient?  Check the care team in [facility] and look for a) attending/consulting [facility] provider listed and b) the [facility] team listed Log into [email or web address] and use [place]'s universal password to access. If you do not have the password, please contact the hospital operator. Locate the [facility] provider you are looking for under [facility] and page to a number that you can be directly reached. If you still have difficulty reaching the provider, please page the [facility] (Director on Call) for the Hospitalists listed on amion for assistance.

## 2021-10-13 NOTE — Progress Notes (Signed)
C/O chronic back pain and received morphine 2 mg

## 2021-10-13 NOTE — Transfer of Care (Signed)
Immediate Anesthesia Transfer of Care Note  Patient: Stephen Fitzpatrick  Procedure(s) Performed: CANNULATED HIP PINNING (Left: Hip)  Patient Location: PACU  Anesthesia Type:Spinal  Level of Consciousness: drowsy  Airway & Oxygen Therapy: Patient Spontanous Breathing and Patient connected to nasal cannula oxygen  Post-op Assessment: Report given to RN and Post -op Vital signs reviewed and stable  Post vital signs: Reviewed and stable  Last Vitals:  Vitals Value Taken Time  BP 123/74   Temp    Pulse 77   Resp 14   SpO2 95%     Last Pain:  Vitals:   10/13/21 1125  TempSrc: Oral  PainSc: 7       Patients Stated Pain Goal: 9 (25/42/70 [id number])  Complications: No notable events documented.

## 2021-10-13 NOTE — Consult Note (Signed)
ORTHOPAEDIC CONSULTATION   REQUESTING PHYSICIAN: Samella Parr, NP   ASSESSMENT AND PLAN: 76 y.o. male with the following: Left Hip Non displaced femoral neck fracture   This patient requires inpatient admission to the hospitalist, to include preoperative clearance and perioperative medical management   - Weight Bearing Status/Activity: NWB Left lower extremity   - Additional recommended labs/tests: Preop Labs: CBC, BMP, PT/INR, Chest XR, and EKG   -VTE Prophylaxis: Please hold prior to OR; to resume POD#1 at the discretion of the primary team   - Pain control: Recommend PO pain medications PRN; judicious use of narcotics   - Follow-up plan: F/u 10-14 days postop   -Procedures: Plan for OR once patient has been medically optimized   Plan for Left hip CRPP   Keep patient NPO    DOS: 10/13/2021     Chief Complaint: Left hip pain   HPI: Stephen Fitzpatrick is a 76 y.o. male who presented to the ED for evaluation after sustaining a mechanical fall. Yesterday, when he stepped on the pillow, tripped and fell.  He landed on the left side.  He immediate pain.  He was brought to the emergency department.  He continues to have pain in the [place] left hip.  When he is not moving, his pain is controlled.  No numbness or tingling.  He has a history of lung cancer, had a portion of his neck.  He is on 4 L of oxygen at baseline.  No blood thinners.         Past Medical History:  Diagnosis Date   Adenocarcinoma of lung (HCC)     CHF (congestive heart failure) (HCC)     COPD (chronic obstructive pulmonary disease) (HCC)     Mass of lung     Nodule of left lung     PONV (postoperative nausea and vomiting)           Past Surgical History:  Procedure Laterality Date   BACK SURGERY       Left video-assisted thoracic surgery, resection   08/24/10    Dr Arlyce Dice    Social History         Socioeconomic History   Marital status: Married      Spouse name: Not on file   Number of children:  Not on file   Years of education: Not on file   Highest education level: Not on file  Occupational History   Not on file  Tobacco Use   Smoking status: Former      Types: Cigarettes      Quit date: 08/24/2010      Years since quitting: 11.1   Smokeless tobacco: Never  Substance and Sexual Activity   Alcohol use: No   Drug use: No   Sexual activity: Never  Other Topics Concern   Not on file  Social History Narrative   Not on file    Social Determinants of Health    Financial Resource Strain: Not on file  Food Insecurity: Not on file  Transportation Needs: Not on file  Physical Activity: Not on file  Stress: Not on file  Social Connections: Not on file    No family history on file. No Known Allergies        Prior to Admission medications   Medication Sig Start Date End Date Taking? Authorizing Provider  acetaminophen (TYLENOL) 325 MG tablet Take 650 mg by mouth every 6 (six) hours as needed for mild pain.  Yes [provider]  Aclidinium Bromide (TUDORZA PRESSAIR) 400 MCG/ACT AEPB Inhale 400 mcg into the lungs 2 (two) times daily. 02/09/13   Yes Lysbeth Penner, FNP  albuterol (PROVENTIL) (2.5 MG/3ML) 0.083% nebulizer solution Take 3 mLs (2.5 mg total) by nebulization every 6 (six) hours as needed for wheezing or shortness of breath. 03/26/15   Yes Triplett, Tammy, PA-C  ALPRAZolam (XANAX) 0.5 MG tablet Take 0.5 mg by mouth daily.     Yes [provider]  budesonide (RHINOCORT ALLERGY) 32 MCG/ACT nasal spray Place 1 spray into both nostrils daily.     Yes [provider]  ipratropium-albuterol (DUONEB) 0.5-2.5 (3) MG/3ML SOLN Take 3 mLs by nebulization 2 (two) times daily. 07/16/12   Yes Chipper Herb, MD  metoprolol (LOPRESSOR) 50 MG tablet TAKE 1 TABLET TWICE A DAY 11/22/12   Yes Hassell Done, Mary-Margaret, FNP  benzonatate (TESSALON) 200 MG capsule Take 1 capsule (200 mg total) by mouth 3 (three) times daily. 02/28/12     Monika Salk, MD   chlorpheniramine-HYDROcodone (TUSSIONEX PENNKINETIC ER) 10-8 MG/5ML LQCR Take 5 mLs by mouth every 12 (twelve) hours as needed. 09/01/11     Lajean Saver, MD  Fluticasone-Salmeterol (ADVAIR) 100-50 MCG/DOSE AEPB Inhale 1 puff into the lungs every 12 (twelve) hours.       [provider]  gabapentin (NEURONTIN) 600 MG tablet Take 600 mg by mouth 2 (two) times daily.  02/01/13     [provider]  oxycodone (ROXICODONE) 30 MG immediate release tablet Take 30 mg by mouth every 4 (four) hours as needed for pain.       [provider]  Oxycodone HCl 20 MG TABS Take 1 tablet (20 mg total) by mouth every 4 (four) hours as needed. 02/28/12     Monika Salk, MD  predniSONE (DELTASONE) 20 MG tablet Take 3 tablets (60 mg total) by mouth daily with breakfast. 10/04/17     Pollina, Gwenyth Allegra, MD  theophylline (THEODUR) 200 MG 12 hr tablet Take 1 tablet (200 mg total) by mouth 2 (two) times daily.       Lysbeth Penner, FNP     Imaging Results (Last 48 hours)  DG Chest Port 1 View   Result Date: 10/13/2021 CLINICAL DATA:  Preop evaluation for upcoming hip surgery EXAM: PORTABLE CHEST 1 VIEW COMPARISON:  10/04/2017, CT from 02/25/2012 FINDINGS: Cardiac shadow is within normal limits. Aortic calcifications are seen. Lungs are hyperinflated with diffuse emphysematous changes. Some architectural distortion is noted in the left perihilar region slightly greater than that seen on the prior exam which may represent some progressive scarring. No sizable effusion is seen. No focal confluent infiltrate is noted. IMPRESSION: Progressive scarring in the left perihilar region likely related to known history of lung carcinoma and treatment. Need for further evaluation can be determined on a clinical basis. Emphysematous changes. Electronically Signed   By: Inez Catalina M.D.   On: 10/13/2021 01:24    DG Hip Unilat W or Wo Pelvis 2-3 Views Left   Result Date: 10/12/2021 CLINICAL DATA:  s/p fall  EXAM: DG HIP (WITH OR WITHOUT PELVIS) 2-3V LEFT COMPARISON:  None Available. FINDINGS: Poorly visualized acute impacted and displaced left femoral neck fracture. Frontal view of the right hip grossly unremarkable. No evidence of acute displaced fracture or diastasis of the bones of the pelvis. There is no evidence of severe arthropathy or other focal bone abnormality. Atherosclerotic plaque. IMPRESSION: 1. Poorly visualized acute impacted and  displaced left femoral neck fracture 2.  Frontal view of the right hip grossly unremarkable. 3.  Aortic Atherosclerosis (ICD10-I70.0). Electronically Signed   By: Iven Finn M.D.   On: 10/12/2021 23:33      Family History Reviewed and non-contributory, no pertinent history of problems with bleeding or anesthesia                Review of Systems No fevers or chills No numbness or tingling No chest pain + shortness of breath No bowel or bladder dysfunction No GI distress No headaches No nausea No vomiting No loss of consciousness.     OBJECTIVE   Vitals:Patient Vitals for the past 8 hrs:   BP Temp Temp src Pulse Resp SpO2 Height Weight  10/13/21 0826 -- -- -- -- -- 100 % -- --  10/13/21 0821 -- -- -- -- -- 100 % -- --  10/13/21 0748 (!) 151/97 97.9 F (36.6 C) -- 87 18 98 % -- --  10/13/21 0450 (!) 158/87 -- -- 79 16 100 % -- --  10/13/21 0310 (!) 191/101 97.7 F (36.5 C) Oral 80 19 99 % 6' 3" (1.905 m) 75.1 kg  10/13/21 0200 (!) 164/88 98 F (36.7 C) -- 86 12 98 % -- --    General: Alert, no acute distress Cardiovascular: Extremities are warm Respiratory: No cyanosis, no use of accessory musculature Skin: No lesions in the area of chief complaint  Neurologic: Sensation intact distally  Psychiatric: Patient is competent for consent with normal mood and affect Lymphatic: No swelling obvious and reported other than the area involved in the exam below Extremities  LLE: Extremity held in a fixed position.  ROM deferred due to known  fracture.  Sensation is intact distally in the sural, saphenous, DP, SP, and plantar nerve distribution. 2+ DP pulse.  Toes are WWP.  Active motion intact in the TA/EHL/GS. RLE: Sensation is intact distally in the sural, saphenous, DP, SP, and plantar nerve distribution. 2+ DP pulse.  Toes are WWP.  Active motion intact in the TA/EHL/GS. Tolerates gentle ROM of the hip.  No pain with axial loading.      Test Results Imaging XR of the Left hip demonstrates a Non displaced femoral neck fracture.   Labs cbc Recent Labs (last 2 labs)       Recent Labs    10/13/21 0015 10/13/21 0518  WBC 12.0* 9.0  HGB 14.3 12.8*  HCT 47.6 41.5  PLT 149* 136*        Labs inflam  Recent Labs (last 2 labs)   No results for input(s): "CRP" in the last 72 hours.   Invalid input(s): "ESR"     Labs coag Recent Labs (last 2 labs)      Recent Labs    10/13/21 0015  INR 1.0        Recent Labs (last 2 labs)

## 2021-10-13 NOTE — Assessment & Plan Note (Signed)
-  Continue albuterol, fluticasone, DuoNeb, Incruse and theophylline -No clinical evidence of exacerbation at this time

## 2021-10-13 NOTE — ED Provider Notes (Signed)
[facility] Provider Note   CSN: [id number] Arrival date & time: 10/12/21  2230     History  Chief Complaint  Patient presents with   Fall    Left hip pain    Stephen Fitzpatrick is a 76 y.o. male.  Patient is a 76 year old male with past medical history of prior lung cancer with lobectomy in 2006, hypertension.  Patient presenting today for evaluation of a left hip injury.  Patient got up off the couch today, then tripped over a pillow and injured his left hip.  He was unable to stand and walk and was brought here by EMS.  He denies other injury.  Pain is worse with any movement.  There are no alleviating factors.  The history is provided by the patient.       Home Medications Prior to Admission medications   Medication Sig Start Date End Date Taking? Authorizing Provider  acetaminophen (TYLENOL) 325 MG tablet Take 650 mg by mouth every 6 (six) hours as needed for mild pain.    [provider]  Aclidinium Bromide (TUDORZA PRESSAIR) 400 MCG/ACT AEPB Inhale 400 mcg into the lungs 2 (two) times daily. 02/09/13   Lysbeth Penner, FNP  albuterol (PROVENTIL) (2.5 MG/3ML) 0.083% nebulizer solution Take 3 mLs (2.5 mg total) by nebulization every 6 (six) hours as needed for wheezing or shortness of breath. 03/26/15   Triplett, Tammy, PA-C  ALPRAZolam (XANAX) 0.5 MG tablet Take 0.5 mg by mouth daily.    [provider]  azithromycin (ZITHROMAX) 250 MG tablet Take 1 tablet (250 mg total) by mouth daily. Take first 2 tablets together, then 1 every day until finished. 03/26/15   Triplett, Tammy, PA-C  benzonatate (TESSALON) 200 MG capsule Take 1 capsule (200 mg total) by mouth 3 (three) times daily. 02/28/12   Monika Salk, MD  budesonide (RHINOCORT ALLERGY) 32 MCG/ACT nasal spray Place 1 spray into both nostrils daily.    [provider]  chlorpheniramine-HYDROcodone (TUSSIONEX PENNKINETIC ER) 10-8 MG/5ML LQCR Take 5 mLs by mouth every 12 (twelve)  hours as needed. 09/01/11   Lajean Saver, MD  doxycycline (VIBRAMYCIN) 100 MG capsule Take 1 capsule (100 mg total) by mouth 2 (two) times daily. 10/04/17   Orpah Greek, MD  Fluticasone-Salmeterol (ADVAIR) 100-50 MCG/DOSE AEPB Inhale 1 puff into the lungs every 12 (twelve) hours.    [provider]  gabapentin (NEURONTIN) 600 MG tablet Take 600 mg by mouth 2 (two) times daily.  02/01/13   [provider]  ipratropium-albuterol (DUONEB) 0.5-2.5 (3) MG/3ML SOLN Take 3 mLs by nebulization 2 (two) times daily. 07/16/12   Chipper Herb, MD  metoprolol (LOPRESSOR) 50 MG tablet TAKE 1 TABLET TWICE A DAY 11/22/12   Hassell Done, Mary-Margaret, FNP  oxycodone (ROXICODONE) 30 MG immediate release tablet Take 30 mg by mouth every 4 (four) hours as needed for pain.    [provider]  Oxycodone HCl 20 MG TABS Take 1 tablet (20 mg total) by mouth every 4 (four) hours as needed. 02/28/12   Monika Salk, MD  predniSONE (DELTASONE) 20 MG tablet Take 3 tablets (60 mg total) by mouth daily with breakfast. 10/04/17   Pollina, Gwenyth Allegra, MD  theophylline (THEODUR) 200 MG 12 hr tablet Take 1 tablet (200 mg total) by mouth 2 (two) times daily.    Lysbeth Penner, FNP      Allergies    Patient has no known allergies.    Review  of Systems   Review of Systems  All other systems reviewed and are negative.   Physical Exam Updated Vital Signs BP (!) 149/77   Pulse 78   Temp 97.6 F (36.4 C) (Oral)   Resp 18   Ht 6\' 3"  (1.905 m)   Wt 79.4 kg   SpO2 100%   BMI 21.87 kg/m  Physical Exam Vitals and nursing note reviewed.  Constitutional:      General: He is not in acute distress.    Appearance: He is well-developed. He is not diaphoretic.  HENT:     Head: Normocephalic and atraumatic.  Cardiovascular:     Rate and Rhythm: Normal rate and regular rhythm.     Heart sounds: No murmur heard.    No friction rub.  Pulmonary:     Effort: Pulmonary effort is normal. No  respiratory distress.     Breath sounds: Normal breath sounds. No wheezing or rales.  Abdominal:     General: Bowel sounds are normal. There is no distension.     Palpations: Abdomen is soft.     Tenderness: There is no abdominal tenderness.  Musculoskeletal:        General: Normal range of motion.     Cervical back: Normal range of motion and neck supple.     Comments: There is tenderness to palpation over the lateral aspect of the left hip.  There is some shortening and external rotation.  DP pulses are palpable and motor and sensation are intact throughout the entire foot and leg.  Skin:    General: Skin is warm and dry.  Neurological:     Mental Status: He is alert and oriented to person, place, and time.     Coordination: Coordination normal.     ED Results / Procedures / Treatments   Labs (all labs ordered are listed, but only abnormal results are displayed) Labs Reviewed  BASIC METABOLIC PANEL  CBC WITH DIFFERENTIAL/PLATELET  PROTIME-INR    EKG EKG Interpretation  Date/Time:  Friday October 13 2021 00:34:09 EDT Ventricular Rate:  84 PR Interval:  190 QRS Duration: 96 QT Interval:  408 QTC Calculation: 483 R Axis:   22 Text Interpretation: Sinus rhythm Anteroseptal infarct, age indeterminate Confirmed by Veryl Speak [phone]) on 10/13/2021 12:35:59 AM  Radiology DG Hip Unilat W or Wo Pelvis 2-3 Views Left  Result Date: 10/12/2021 CLINICAL DATA:  s/p fall EXAM: DG HIP (WITH OR WITHOUT PELVIS) 2-3V LEFT COMPARISON:  None Available. FINDINGS: Poorly visualized acute impacted and displaced left femoral neck fracture. Frontal view of the right hip grossly unremarkable. No evidence of acute displaced fracture or diastasis of the bones of the pelvis. There is no evidence of severe arthropathy or other focal bone abnormality. Atherosclerotic plaque. IMPRESSION: 1. Poorly visualized acute impacted and displaced left femoral neck fracture 2.  Frontal view of the right hip grossly  unremarkable. 3.  Aortic Atherosclerosis (ICD10-I70.0). Electronically Signed   By: Iven Finn M.D.   On: 10/12/2021 23:33    Procedures Procedures    Medications Ordered in ED Medications  morphine (PF) 4 MG/ML injection 4 mg (has no administration in time range)    ED Course/ Medical Decision Making/ A&P  Patient brought by EMS after a trip and fall at home.  He injured his left hip, but reports no other injuries.  X-rays show an acute impacted and displaced left femoral neck fracture.  This finding was discussed with Dr. Amedeo Kinsman from orthopedic surgery.  He is  recommending admission to the hospitalist service and will consult in the morning.  Patient to be kept n.p.o. until seen by Ortho.  Patient given morphine for pain and seems to be feeling somewhat improved.  Final Clinical Impression(s) / ED Diagnoses Final diagnoses:  None    Rx / DC Orders ED Discharge Orders     None         Veryl Speak, MD 10/13/21 [phone]

## 2021-10-13 NOTE — Assessment & Plan Note (Signed)
-  mechanical fall -Left hip impacted and displaced femoral neck fracture -Ortho consulted and plans for surgery this AM -NPO -Pre-op EKG is without acute ischemic changes -Pre-op CXR without acute changes- known lung cancer -pre-op UA is not indicative of UTI -Continue pain scale for pain control -Will need PT eval and treat prior to discharge -Continue to monitor

## 2021-10-13 NOTE — Assessment & Plan Note (Signed)
-  continue xanax

## 2021-10-13 NOTE — Anesthesia Preprocedure Evaluation (Addendum)
Anesthesia Evaluation  Patient identified by MRN, date of birth, ID band Patient awake    Reviewed: Allergy & Precautions, NPO status , Patient's Chart, lab work & pertinent test results  History of Anesthesia Complications (+) PONV and history of anesthetic complications  Airway Mallampati: II  TM Distance: >3 FB Neck ROM: Full    Dental  (+) Dental Advisory Given, Missing, Chipped, Poor Dentition, Loose   Pulmonary shortness of breath, with exertion and Long-Term Oxygen Therapy, pneumonia, COPD,  oxygen dependent, former smoker,  Left lung adenocarcinoma, lobectomy   Pulmonary exam normal breath sounds clear to auscultation       Cardiovascular Exercise Tolerance: Poor hypertension, Pt. on medications +CHF  Normal cardiovascular exam Rhythm:Regular Rate:Normal     Neuro/Psych PSYCHIATRIC DISORDERS Anxiety    GI/Hepatic GERD  Medicated and Poorly Controlled,  Endo/Other    Renal/GU      Musculoskeletal  (+) Arthritis  (LEFT HIP FX),   Abdominal   Peds  Hematology   Anesthesia Other Findings Back pain  Reproductive/Obstetrics                            Anesthesia Physical Anesthesia Plan  ASA: 4  Anesthesia Plan: Spinal   Post-op Pain Management: Dilaudid IV   Induction: Intravenous  PONV Risk Score and Plan: Ondansetron and Dexamethasone  Airway Management Planned: Nasal Cannula, Natural Airway and Simple Face Mask  Additional Equipment:   Intra-op Plan:   Post-operative Plan:   Informed Consent: I have reviewed the patients History and Physical, chart, labs and discussed the procedure including the risks, benefits and alternatives for the proposed anesthesia with the patient or authorized representative who has indicated his/her understanding and acceptance.     Dental advisory given  Plan Discussed with: CRNA and Surgeon  Anesthesia Plan Comments: (Possible GA with  airway was explained.)       Anesthesia Quick Evaluation

## 2021-10-13 NOTE — Anesthesia Postprocedure Evaluation (Signed)
Anesthesia Post Note  Patient: Stephen Fitzpatrick  Procedure(s) Performed: CANNULATED HIP PINNING (Left: Hip)  Patient location during evaluation: PACU Anesthesia Type: Spinal Level of consciousness: awake and alert and oriented Pain management: pain level controlled Vital Signs Assessment: post-procedure vital signs reviewed and stable Respiratory status: spontaneous breathing, nonlabored ventilation and respiratory function stable Cardiovascular status: blood pressure returned to baseline and stable Postop Assessment: no apparent nausea or vomiting Anesthetic complications: no   No notable events documented.   Last Vitals:  Vitals:   10/13/21 1422 10/13/21 1430  BP: 123/74 (!) 145/84  Pulse: 75 76  Resp: (!) 7 16  Temp: 37.2 C   SpO2: 98% 97%    Last Pain:  Vitals:   10/13/21 1422  TempSrc:   PainSc: 0-No pain                 Kristina Bertone C Naysa Puskas

## 2021-10-13 NOTE — TOC Progression Note (Signed)
  Transition of Care [facility]) Screening Note   Patient Details  Name: Stephen Fitzpatrick Date of Birth: May 17, 1945   Transition of Care [facility]) CM/SW Contact:    Boneta Lucks, RN Phone Number: 10/13/2021, 10:19 AM  PT eval pending  Transition of Care Department (TOC) has reviewed patient and no TOC needs have been identified at this time. We will continue to monitor patient advancement through interdisciplinary progression rounds. If new patient transition needs arise, please place a TOC consult.    Barriers to Discharge: Continued Medical Work up  Expected Discharge Plan and Services     [facility] or SNF

## 2021-10-13 NOTE — Op Note (Signed)
Orthopaedic Surgery Operative Note (CSN: [id number])  Stephen Fitzpatrick  1945-08-25 Date of Surgery: 10/13/2021   Diagnoses:  Left hip femoral neck fracture  Procedure: CRPP of the left femoral neck fracture   Operative Finding Successful completion of the planned procedure.  Placement of 3 x 7.0 mm cannulated screws with washers to stabilize the Non displaced femoral neck fracture   Post-Op Diagnosis: Same Surgeons:Primary: Mordecai Rasmussen, MD Assistants:  None Location: [facility] OR ROOM 4 Anesthesia: Sedation plus regional anesthesia Antibiotics: Ancef 2 g Tourniquet time: N/A Estimated Blood Loss: 25 cc Complications: None Specimens: None Implants: Implant Name Type Inv. Item Serial No. Manufacturer Lot No. LRB No. Used Action         1 Implanted    Indications for Surgery:   Stephen Fitzpatrick is a 76 y.o. male who sustained a mechanical fall and landed on his left hip.  He had immediate pain and inability to ambulate.  In order to restore form and function, I recommended operative fixation.  Benefits and risks of operative and nonoperative management were discussed prior to surgery with the patient and informed consent form was completed.  Specific risks including infection, need for additional surgery, nonunion, malunion, AVN, persistent pain and more severe complications were discussed.  He elected to proceed with surgery.  Surgical consent was finalized.    Procedure:   The patient was identified properly. Informed consent was obtained and the surgical site was marked. The patient was taken to the OR where general anesthesia was induced.  The patient was positioned supine on a fracture table.  The left leg was prepped and draped in the usual sterile fashion.  Timeout was performed before the beginning of the case.  We started by making a lateral incision over the hip, in line with the starting point for the screws.  We incised sharply through the IT band to expose the lateral border of  the femur.  Under fluoroscopic guidance, we placed the first guide wire along the inferior aspect of the femoral neck.  We ensured that the starting point was superior to the lesser trochanter so as to reduce the risk of a stress riser.  Orthogonal images confirmed the placement of the first guidewire to be inferior and posterior within the femoral neck.  We then placed the targeting device and placed 2 additional guidewires in the superior and anterior and then superior and posterior femoral neck. We used fluoroscopy we were satisfied with the positioning of the guidewires.  Using the measuring device, we determined the length of screw needed.  We then proceeded to drill the lateral cortex.  All 3 screws with a washer were then inserted under fluoroscopic guidance.  Orthogonal imaging confirmed the location of the screws of sufficient length and the washers were flush with the lateral cortex.   We irrigated the wound copiously and then closed the incision in a multilayer fashion with absorbable suture.  Sterile dressing was placed.  Patient was awoken taken to PACU in stable condition.   Post-operative plan:  The patient will be returned to the floor.   Weightbearing status:  WBAT LLE Dressing to remain in place until POD#2/3 DVT prophylaxis per primary team, no orthopedic contraindications.    Pain control with PRN pain medication preferring oral medicines.   Follow up plan will be scheduled in approximately 10-14 days days for incision check and XR of the Left hip.

## 2021-10-13 NOTE — Assessment & Plan Note (Signed)
-  Last echo in care everywhere 2020: -Hyperdynamic left ventricular systolic function, ejection fraction 65 to  70%   Normal right ventricular systolic function  -Continue beta blocker -No clinical evidence of acute exacerbation at this time.  -Continue to monitor

## 2021-10-13 NOTE — Anesthesia Procedure Notes (Signed)
Spinal  Patient location during procedure: OR Start time: 10/13/2021 12:45 PM End time: 10/13/2021 12:52 PM Staffing Performed: other anesthesia staff  Other anesthesia staff: Evern Core, RN Performed by: Denese Killings, MD Authorized by: Denese Killings, MD   Preanesthetic Checklist Completed: patient identified, IV checked, site marked, risks and benefits discussed, surgical consent, monitors and equipment checked, pre-op evaluation and timeout performed Spinal Block Patient position: right lateral decubitus Prep: ChloraPrep Patient monitoring: heart rate, continuous pulse ox, blood pressure and cardiac monitor Approach: midline Location: L3-4 Injection technique: single-shot Needle Needle type: Pencan  Needle gauge: 25 G Assessment Sensory level: T10 Events: CSF return Additional Notes CSF return checked mid dose.

## 2021-10-13 NOTE — Progress Notes (Signed)
From pacu after left hip surgery .  Alert and oriented, vitals stable and small gauze dressing to left hip dry and intact.   Asking for food and drink and family at bedside.

## 2021-10-14 DIAGNOSIS — F419 Anxiety disorder, unspecified: Secondary | ICD-10-CM | POA: Diagnosis not present

## 2021-10-14 DIAGNOSIS — S72002D Fracture of unspecified part of neck of left femur, subsequent encounter for closed fracture with routine healing: Secondary | ICD-10-CM | POA: Diagnosis not present

## 2021-10-14 DIAGNOSIS — J9611 Chronic respiratory failure with hypoxia: Secondary | ICD-10-CM | POA: Diagnosis not present

## 2021-10-14 DIAGNOSIS — J449 Chronic obstructive pulmonary disease, unspecified: Secondary | ICD-10-CM

## 2021-10-14 LAB — COMPREHENSIVE METABOLIC PANEL
ALT: 10 U/L (ref 0–44)
AST: 18 U/L (ref 15–41)
Albumin: 3.1 g/dL — ABNORMAL LOW (ref 3.5–5.0)
Alkaline Phosphatase: 60 U/L (ref 38–126)
Anion gap: 6 (ref 5–15)
BUN: 11 mg/dL (ref 8–23)
CO2: 28 mmol/L (ref 22–32)
Calcium: 8 mg/dL — ABNORMAL LOW (ref 8.9–10.3)
Chloride: 106 mmol/L (ref 98–111)
Creatinine, Ser: 1.04 mg/dL (ref 0.61–1.24)
GFR, Estimated: >60 mL/min (ref >60)
Glucose, Bld: 170 mg/dL — ABNORMAL HIGH (ref 70–99)
Potassium: 4.6 mmol/L (ref 3.5–5.1)
Sodium: 140 mmol/L (ref 135–145)
Total Bilirubin: 0.4 mg/dL (ref 0.3–1.2)
Total Protein: 6.1 g/dL — ABNORMAL LOW (ref 6.5–8.1)

## 2021-10-14 LAB — CBC WITH DIFFERENTIAL/PLATELET
Abs Immature Granulocytes: 0.09 10*3/uL — ABNORMAL HIGH (ref 0.00–0.07)
Basophils Absolute: 0 10*3/uL (ref 0.0–0.1)
Basophils Relative: 0 %
Eosinophils Absolute: 0 10*3/uL (ref 0.0–0.5)
Eosinophils Relative: 0 %
HCT: 42.5 % (ref 39.0–52.0)
Hemoglobin: 12.8 g/dL — ABNORMAL LOW (ref 13.0–17.0)
Immature Granulocytes: 1 %
Lymphocytes Relative: 6 %
Lymphs Abs: 0.8 10*3/uL (ref 0.7–4.0)
MCH: 26.3 pg (ref 26.0–34.0)
MCHC: 30.1 g/dL (ref 30.0–36.0)
MCV: 87.3 fL (ref 80.0–100.0)
Monocytes Absolute: 1.5 10*3/uL — ABNORMAL HIGH (ref 0.1–1.0)
Monocytes Relative: 11 %
Neutro Abs: 11.5 10*3/uL — ABNORMAL HIGH (ref 1.7–7.7)
Neutrophils Relative %: 82 %
Platelets: 151 10*3/uL (ref 150–400)
RBC: 4.87 MIL/uL (ref 4.22–5.81)
RDW: 13.6 % (ref 11.5–15.5)
WBC: 13.9 10*3/uL — ABNORMAL HIGH (ref 4.0–10.5)
nRBC: 0 % (ref 0.0–0.2)

## 2021-10-14 MED ORDER — SODIUM CHLORIDE 0.9 % IV SOLN: INTRAVENOUS | Status: DC | PRN

## 2021-10-14 MED ORDER — POLYETHYLENE GLYCOL 3350 17 G PO PACK
17.0000 g | PACK | Freq: Every day | ORAL | Status: DC
  Administered 2021-10-14 – 2021-10-17 (×4): 17 g via ORAL
  Filled 2021-10-14 (×4): qty 1

## 2021-10-14 MED ORDER — MORPHINE SULFATE (PF) 2 MG/ML IV SOLN
2.0000 mg | INTRAVENOUS | Status: DC | PRN
  Administered 2021-10-14 – 2021-10-15 (×3): 2 mg via INTRAVENOUS
  Filled 2021-10-14 (×4): qty 1

## 2021-10-14 NOTE — Plan of Care (Signed)
  Problem: Education: Goal: Knowledge of General Education information will improve Description Including pain rating scale, medication(s)/side effects and non-pharmacologic comfort measures Outcome: Progressing   Problem: Health Behavior/Discharge Planning: Goal: Ability to manage health-related needs will improve Outcome: Progressing   

## 2021-10-14 NOTE — Progress Notes (Signed)
   ORTHOPAEDIC PROGRESS NOTE  s/p Procedure(s): CANNULATED HIP PINNING; CRPP L Hip  DOS: 10/13/2021  SUBJECTIVE: No issues over night.  Pain medicine is effective.  He ate well following surgery.  Still having pain but able to move his leg more than before surgery.  OBJECTIVE: PE:  Alert and oriented No acute distress Surgical dressing is clean, dry and intact Active motion in the EHL/TA Sensation intact to the dorsum of his foot Toes are warm and well perfused.   Vitals:   10/14/21 0145 10/14/21 0508  BP: 137/88 (!) 142/67  Pulse: 74 66  Resp: 20   Temp: 97.6 F (36.4 C) 98.6 F (37 C)  SpO2: 97% 100%      Latest Ref Rng & Units 10/14/2021    6:07 AM 10/13/2021    5:18 AM 10/13/2021   12:15 AM  CBC  WBC 4.0 - 10.5 K/uL 13.9  9.0  12.0   Hemoglobin 13.0 - 17.0 g/dL 12.8  12.8  14.3   Hematocrit 39.0 - 52.0 % 42.5  41.5  47.6   Platelets 150 - 400 K/uL 151  136  149      ASSESSMENT: Stephen Fitzpatrick is a 76 y.o. male doing well postoperatively.  PLAN: Weightbearing: WBAT LLE Insicional and dressing care: Reinforce dressings as needed Orthopedic device(s): None VTE prophylaxis: Recommend Aspirin 81mg  BID  6 weeks Pain control: As needed; judicious use of narcotics.  Follow - up plan:  F/U 2 weeks.  If DC to rehab facility, sutures can be trimmed at 2 weeks and I can see him in clinic in 6 weeks.  If there are any concerns, he can be seen in follow up at any time.    Contact information:     Sayaka Hoeppner A. Amedeo Kinsman, MD [facility] [address] [place],  [place]  [address] Phone: [phone] Fax: [phone]

## 2021-10-14 NOTE — Evaluation (Signed)
Physical Therapy Evaluation Patient Details Name: Stephen Fitzpatrick MRN: [id number] DOB: 10-Jul-1945 Today's Date: 10/14/2021  History of Present Illness  Stephen Fitzpatrick is a 76 y.o. male who sustained a mechanical fall and landed on his left hip.  He had immediate pain and inability to ambulate.  In order to restore form and function, I recommended operative fixation.  Benefits and risks of operative and nonoperative management were discussed prior to surgery with the patient and informed consent form was completed.  Specific risks including infection, need for additional surgery, nonunion, malunion, AVN, persistent pain and more severe complications were discussed.  He elected to proceed with surgery.  Surgical consent was finalized.   Clinical Impression  Patient limited for functional mobility as stated below secondary to BLE weakness, fatigue, L hip pain and poor standing balance. Patient requires assist to pull to seated and for LE movement to transition to EOB. He demonstrates good sitting tolerance with weight shifted off LLE. He requires mod assist and stabilizing RW to transfer to standing. Patient able to perform weight shifts at bedside but is limited by pain, nausea, and SOB. Patient assisted back to bed at EOS. Patient will benefit from continued physical therapy in hospital and recommended venue below to increase strength, balance, endurance for safe ADLs and gait.      Recommendations for follow up therapy are one component of a multi-disciplinary discharge planning process, led by the attending physician.  Recommendations may be updated based on patient status, additional functional criteria and insurance authorization.  Follow Up Recommendations Skilled nursing-short term rehab (<3 hours/day) Can patient physically be transported by private vehicle: No    Assistance Recommended at Discharge Intermittent Supervision/Assistance  Patient can return home with the following  A lot of  help with walking and/or transfers;A lot of help with bathing/dressing/bathroom;Assistance with cooking/housework;Assist for transportation;Help with stairs or ramp for entrance    Equipment Recommendations None recommended by PT  Recommendations for Other Services       Functional Status Assessment Patient has had a recent decline in their functional status and demonstrates the ability to make significant improvements in function in a reasonable and predictable amount of time.     Precautions / Restrictions Precautions Precautions: Fall Restrictions Weight Bearing Restrictions: No LLE Weight Bearing: Weight bearing as tolerated      Mobility  Bed Mobility Overal bed mobility: Needs Assistance Bed Mobility: Supine to Sit, Sit to Supine     Supine to sit: Mod assist Sit to supine: Mod assist   General bed mobility comments: assist to pull to seated EOB and for LE movement in/out of bed    Transfers Overall transfer level: Needs assistance Equipment used: Rolling walker (2 wheels) Transfers: Sit to/from Stand Sit to Stand: Mod assist           General transfer comment: assist for LE weakness and for stabilizing RW    Ambulation/Gait                  Stairs            Wheelchair Mobility    Modified Rankin (Stroke Patients Only)       Balance Overall balance assessment: Needs assistance Sitting-balance support: Bilateral upper extremity supported, Feet supported Sitting balance-Leahy Scale: Fair Sitting balance - Comments: seated EOB   Standing balance support: Bilateral upper extremity supported, Reliant on assistive device for balance Standing balance-Leahy Scale: Poor Standing balance comment: requires assist and RW  Pertinent Vitals/Pain Pain Assessment Pain Assessment: Faces Faces Pain Scale: Hurts little more Pain Location: L hip Pain Descriptors / Indicators: Sore Pain Intervention(s):  Limited activity within patient's tolerance, Monitored during session, Repositioned    Home Living Family/patient expects to be discharged to:: Private residence Living Arrangements: Children Available Help at Discharge: Family;Available 24 hours/day Type of Home: Mobile home Home Access: Stairs to enter Entrance Stairs-Rails: Right;Left;Can reach both Entrance Stairs-Number of Steps: 4 steps back, 6 front   Home Layout: One level Home Equipment: Conservation officer, nature (2 wheels);Cane - single point;BSC/3in1;Shower seat      Prior Function Prior Level of Function : Needs assist             Mobility Comments: household ambulation with RW ADLs Comments: Independent with basic ADL, family assist PRN     Hand Dominance        Extremity/Trunk Assessment   Upper Extremity Assessment Upper Extremity Assessment: Defer to OT evaluation    Lower Extremity Assessment Lower Extremity Assessment: Generalized weakness    Cervical / Trunk Assessment Cervical / Trunk Assessment: Normal  Communication   Communication: No difficulties  Cognition Arousal/Alertness: Awake/alert Behavior During Therapy: WFL for tasks assessed/performed Overall Cognitive Status: Within Functional Limits for tasks assessed                                          General Comments      Exercises Other Exercises Other Exercises: lateral weight shifting x 10 bilateral   Assessment/Plan    PT Assessment Patient needs continued PT services  PT Problem List Decreased strength;Decreased mobility;Decreased activity tolerance;Decreased balance;Pain       PT Treatment Interventions DME instruction;Therapeutic activities;Gait training;Therapeutic exercise;Patient/family education;Stair training;Balance training;Functional mobility training;Neuromuscular re-education;Manual techniques    PT Goals (Current goals can be found in the Care Plan section)  Acute Rehab PT Goals Patient Stated Goal:  return home PT Goal Formulation: With patient Time For Goal Achievement: 10/28/21 Potential to Achieve Goals: Fair    Frequency Min 3X/week     Co-evaluation               AM-PAC PT "6 Clicks" Mobility  Outcome Measure Help needed turning from your back to your side while in a flat bed without using bedrails?: A Little Help needed moving from lying on your back to sitting on the side of a flat bed without using bedrails?: A Little Help needed moving to and from a bed to a chair (including a wheelchair)?: A Lot Help needed standing up from a chair using your arms (e.g., wheelchair or bedside chair)?: A Lot Help needed to walk in hospital room?: A Lot Help needed climbing 3-5 steps with a railing? : A Lot 6 Click Score: 14    End of Session Equipment Utilized During Treatment: Gait belt;Oxygen Activity Tolerance: Patient limited by fatigue Patient left: in bed;with call bell/phone within reach;with bed alarm set Nurse Communication: Mobility status PT Visit Diagnosis: Unsteadiness on feet (R26.81);Other abnormalities of gait and mobility (R26.89);Muscle weakness (generalized) (M62.81);Pain Pain - Right/Left: Left Pain - part of body: Hip    Time: 3086-5784 PT Time Calculation (min) (ACUTE ONLY): 22 min   Charges:   PT Evaluation $PT Eval Moderate Complexity: 1 Mod PT Treatments $Therapeutic Activity: 8-22 mins        11:16 AM, 10/14/21 Mearl Latin PT, DPT Physical Therapist at [facility]  [facility]

## 2021-10-14 NOTE — Assessment & Plan Note (Signed)
--   bronchodilators as ordered

## 2021-10-14 NOTE — Assessment & Plan Note (Signed)
--   controlled on home metoprolol 50 mg BID

## 2021-10-14 NOTE — Plan of Care (Signed)
  Problem: Acute Rehab PT Goals(only PT should resolve) Goal: Pt Will Go Supine/Side To Sit Outcome: Progressing Flowsheets (Taken 10/14/2021 1117) Pt will go Supine/Side to Sit: with minimal assist Goal: Pt Will Go Sit To Supine/Side Outcome: Progressing Flowsheets (Taken 10/14/2021 1117) Pt will go Sit to Supine/Side: with minimal assist Goal: Patient Will Transfer Sit To/From Stand Outcome: Progressing Flowsheets (Taken 10/14/2021 1117) Patient will transfer sit to/from stand: with minimal assist Goal: Pt Will Transfer Bed To Chair/Chair To Bed Outcome: Progressing Flowsheets (Taken 10/14/2021 1117) Pt will Transfer Bed to Chair/Chair to Bed: with min assist Goal: Pt Will Ambulate Outcome: Progressing Flowsheets (Taken 10/14/2021 1117) Pt will Ambulate:  10 feet  with minimal assist  with moderate assist  with rolling walker Goal: Pt/caregiver will Perform Home Exercise Program Outcome: Progressing Flowsheets (Taken 10/14/2021 1117) Pt/caregiver will Perform Home Exercise Program:  For increased strengthening  For improved balance  Independently  11:17 AM, 10/14/21 Mearl Latin PT, DPT Physical Therapist at [facility]

## 2021-10-14 NOTE — Assessment & Plan Note (Addendum)
--   stable; continue bronchodilators

## 2021-10-14 NOTE — Progress Notes (Signed)
PROGRESS NOTE   Stephen Fitzpatrick  OEV:[id number] DOB: 1945-08-05 DOA: 10/12/2021 PCP: Adaline Sill, NP   Chief Complaint  Patient presents with   Fall    Left hip pain   Level of care: Telemetry  Brief Admission History:  76 y.o. male with medical history significant of O2 dependent COPD with baseline requirement 4 L/min, hypertension with elevated LVEF but no evidence of diastolic dysfunction on echo 2021, adenocarcinoma of the left lung status post chemoradiation and left VATS procedure in 2012, history of COVID in 2021, prior tobacco use stopped in 2021, incidental finding of ischemic CVA 2012.  Presented with left thigh pain after accidental fall at home.  No syncope or presyncopal symptoms, no shortness of breath or chest pain preceding fall.  In the ER it was revealed that patient had to impacted and displaced left femoral neck fracture.  Orthopedic physician Dr. Colon Branch consulted by Dr. Lanny Cramp in the ER and patient will undergo surgical repair   Assessment and Plan: * Closed left hip fracture ([place]) -mechanical fall -Left hip impacted and displaced femoral neck fracture -Ortho consulted and had ORIF on 7/7 with Dr. Amedeo Kinsman -PT recommending SNF placement  -Pt not sure he will consent to go to SNF at this time  Former tobacco use -- bronchodilators as ordered   HTN (hypertension) -- controlled on home metoprolol 50 mg BID  Chronic respiratory failure with hypoxia ([place]) -- stable; continue bronchodilators  Anxiety -continue xanax  COPD (chronic obstructive pulmonary disease) ([place]) -Continue albuterol, fluticasone, DuoNeb, Incruse and theophylline -No clinical evidence of exacerbation at this time  Adenocarcinoma of Left Lower Lobe lung - s/p chemoradiation and VATS   DVT prophylaxis: SQ heparin >>transition to asa 81 mg bid at discharge x 6 weeks Code Status: full  Family Communication: daughter updated 7/7 Disposition: Status is: Inpatient Remains inpatient  appropriate because: Intensity    Consultants:  orthopedics Procedures:  ORIF 7/7   Subjective: Pt reports feeling a little better. He has been able to start moving left leg, it still hurts but more bearable today.    Objective: Vitals:   10/14/21 0145 10/14/21 0508 10/14/21 0754 10/14/21 0800  BP: 137/88 (!) 142/67    Pulse: 74 66    Resp: 20     Temp: 97.6 F (36.4 C) 98.6 F (37 C)    TempSrc: Oral Oral    SpO2: 97% 100% 100% 100%  Weight:      Height:        Intake/Output Summary (Last 24 hours) at 10/14/2021 1335 Last data filed at 10/14/2021 1300 Gross per 24 hour  Intake 1900.96 ml  Output 1250 ml  Net 650.96 ml   Filed Weights   10/12/21 2238 10/12/21 2239 10/13/21 0310  Weight: 79.4 kg 79.4 kg 75.1 kg   Examination:  General exam: Appears calm and comfortable  Respiratory system: Clear to auscultation. Respiratory effort normal. Cardiovascular system: normal S1 & S2 heard. No JVD, murmurs, rubs, gallops or clicks. No pedal edema. Gastrointestinal system: Abdomen is nondistended, soft and nontender. No organomegaly or masses felt. Normal bowel sounds heard. Central nervous system: Alert and oriented. No focal neurological deficits. Extremities: Symmetric 5 x 5 power. Skin: No rashes, lesions or ulcers. Psychiatry: Judgement and insight appear normal. Mood & affect appropriate.   Data Reviewed: I have personally reviewed following labs and imaging studies  CBC: Recent Labs  Lab 10/13/21 0015 10/13/21 0518 10/14/21 0607  WBC 12.0* 9.0 13.9*  NEUTROABS 9.7*  --  11.5*  HGB 14.3 12.8* 12.8*  HCT 47.6 41.5 42.5  MCV 88.0 86.5 87.3  PLT 149* 136* 562    Basic Metabolic Panel: Recent Labs  Lab 10/13/21 0015 10/13/21 0518 10/14/21 0607  NA 139 139 140  K 4.4 3.9 4.6  CL 104 108 106  CO2 26 26 28   GLUCOSE 104* 101* 170*  BUN 15 15 11   CREATININE 0.90 0.73 1.04  CALCIUM 8.2* 7.7* 8.0*    CBG: No results for input(s): "GLUCAP" in the last 168  hours.  Recent Results (from the past 240 hour(s))  Surgical PCR screen     Status: None   Collection Time: 10/13/21  3:22 AM   Specimen: Nasal Mucosa; Nasal Swab  Result Value Ref Range Status   MRSA, PCR NEGATIVE NEGATIVE Final   Staphylococcus aureus NEGATIVE NEGATIVE Final    Comment: (NOTE) The Xpert SA Assay (FDA approved for NASAL specimens in patients 30 years of age and older), is one component of a comprehensive surveillance program. It is not intended to diagnose infection nor to guide or monitor treatment. Performed at [facility], [address] [place], [place] [address]      Radiology Studies: DG HIP UNILAT WITH PELVIS 2-3 VIEWS LEFT  Result Date: 10/13/2021 CLINICAL DATA:  Left hip fracture, insertion of cannulated screws EXAM: DG HIP (WITH OR WITHOUT PELVIS) 2-3V LEFT COMPARISON:  None Available. FINDINGS: Intraoperative images during left femoral neck fixation utilizing 3 cannulated screws. IMPRESSION: Intraoperative images during left femoral neck fixation utilizing 3 cannulated screws. No evidence of immediate complication. Electronically Signed   By: Maurine Simmering M.D.   On: 10/13/2021 14:54   DG HIP UNILAT WITH PELVIS 2-3 VIEWS LEFT  Result Date: 10/13/2021 CLINICAL DATA:  Postop. EXAM: DG HIP (WITH OR WITHOUT PELVIS) 2-3V LEFT COMPARISON:  10/12/2021. FINDINGS: Fixation of the left femoral neck fracture with 3 screws. Improved, near anatomic alignment. No evidence of immediate hardware complication. No joint dislocation. Partially imaged lower lumbar degenerative change. IMPRESSION: Interval fixation of a left femoral neck fracture. No evidence of immediate hardware complication. Electronically Signed   By: Margaretha Sheffield M.D.   On: 10/13/2021 14:46   DG C-Arm 1-60 Min-No Report  Result Date: 10/13/2021 Fluoroscopy was utilized by the requesting physician.  No radiographic interpretation.   DG Chest Port 1 View  Result Date: 10/13/2021 CLINICAL DATA:  Preop  evaluation for upcoming hip surgery EXAM: PORTABLE CHEST 1 VIEW COMPARISON:  10/04/2017, CT from 02/25/2012 FINDINGS: Cardiac shadow is within normal limits. Aortic calcifications are seen. Lungs are hyperinflated with diffuse emphysematous changes. Some architectural distortion is noted in the left perihilar region slightly greater than that seen on the prior exam which may represent some progressive scarring. No sizable effusion is seen. No focal confluent infiltrate is noted. IMPRESSION: Progressive scarring in the left perihilar region likely related to known history of lung carcinoma and treatment. Need for further evaluation can be determined on a clinical basis. Emphysematous changes. Electronically Signed   By: Inez Catalina M.D.   On: 10/13/2021 01:24   DG Hip Unilat W or Wo Pelvis 2-3 Views Left  Result Date: 10/12/2021 CLINICAL DATA:  s/p fall EXAM: DG HIP (WITH OR WITHOUT PELVIS) 2-3V LEFT COMPARISON:  None Available. FINDINGS: Poorly visualized acute impacted and displaced left femoral neck fracture. Frontal view of the right hip grossly unremarkable. No evidence of acute displaced fracture or diastasis of the bones of the pelvis. There is no evidence of severe arthropathy  or other focal bone abnormality. Atherosclerotic plaque. IMPRESSION: 1. Poorly visualized acute impacted and displaced left femoral neck fracture 2.  Frontal view of the right hip grossly unremarkable. 3.  Aortic Atherosclerosis (ICD10-I70.0). Electronically Signed   By: Iven Finn M.D.   On: 10/12/2021 23:33    Scheduled Meds:  ALPRAZolam  0.5 mg Oral Daily   fluticasone  1 spray Each Nare Daily   gabapentin  600 mg Oral BID   heparin  5,000 Units Subcutaneous Q8H   ipratropium-albuterol  3 mL Nebulization BID   metoprolol tartrate  50 mg Oral BID   mupirocin ointment  1 Application Nasal BID   pantoprazole  40 mg Oral Daily   polyethylene glycol  17 g Oral Daily   theophylline  400 mg Oral Daily   umeclidinium  bromide  1 puff Inhalation Daily   Continuous Infusions:   ceFAZolin (ANCEF) IV 2 g (10/14/21 0527)   dextrose 5 % and 0.9 % NaCl with KCl 20 mEq/L 10 mL/hr at 10/14/21 1047   methocarbamol (ROBAXIN) IV Stopped (10/14/21 0347)     LOS: 1 day   Time spent: 35 mins  Andreu Drudge Wynetta Emery, MD How to contact the [facility] Attending or Consulting provider [place] or covering provider during after hours [place], for this patient?  Check the care team in [facility] and look for a) attending/consulting [facility] provider listed and b) the [facility] team listed Log into [email or web address] and use [place]'s universal password to access. If you do not have the password, please contact the hospital operator. Locate the [facility] provider you are looking for under [facility] and page to a number that you can be directly reached. If you still have difficulty reaching the provider, please page the [facility] (Director on Call) for the Hospitalists listed on amion for assistance.  10/14/2021, 1:35 PM

## 2021-10-14 NOTE — Progress Notes (Signed)
Patient and daughter state they are agreeable to SNF now

## 2021-10-14 NOTE — Hospital Course (Addendum)
76 y.o. male with medical history significant of O2 dependent COPD with baseline requirement 4 L/min, hypertension with elevated LVEF but no evidence of diastolic dysfunction on echo 2021, adenocarcinoma of the left lung status post chemoradiation and left VATS procedure in 2012, history of COVID in 2021, prior tobacco use stopped in 2021, incidental finding of ischemic CVA 2012.  Presented with left thigh pain after accidental fall at home.  No syncope or presyncopal symptoms, no shortness of breath or chest pain preceding fall.  In the ER it was revealed that patient had to impacted and displaced left femoral neck fracture.  Orthopedic physician Dr. Colon Branch consulted by Dr. Lanny Cramp in the ER and patient will undergo surgical repair.

## 2021-10-14 NOTE — Assessment & Plan Note (Signed)
-   s/p chemoradiation and VATS

## 2021-10-15 DIAGNOSIS — F419 Anxiety disorder, unspecified: Secondary | ICD-10-CM | POA: Diagnosis not present

## 2021-10-15 DIAGNOSIS — J9611 Chronic respiratory failure with hypoxia: Secondary | ICD-10-CM | POA: Diagnosis not present

## 2021-10-15 DIAGNOSIS — S72002D Fracture of unspecified part of neck of left femur, subsequent encounter for closed fracture with routine healing: Secondary | ICD-10-CM | POA: Diagnosis not present

## 2021-10-15 DIAGNOSIS — J449 Chronic obstructive pulmonary disease, unspecified: Secondary | ICD-10-CM | POA: Diagnosis not present

## 2021-10-15 LAB — CBC
HCT: 39.9 % (ref 39.0–52.0)
Hemoglobin: 12.2 g/dL — ABNORMAL LOW (ref 13.0–17.0)
MCH: 26.6 pg (ref 26.0–34.0)
MCHC: 30.6 g/dL (ref 30.0–36.0)
MCV: 86.9 fL (ref 80.0–100.0)
Platelets: 141 10*3/uL — ABNORMAL LOW (ref 150–400)
RBC: 4.59 MIL/uL (ref 4.22–5.81)
RDW: 14 % (ref 11.5–15.5)
WBC: 10.9 10*3/uL — ABNORMAL HIGH (ref 4.0–10.5)
nRBC: 0 % (ref 0.0–0.2)

## 2021-10-15 MED ORDER — MORPHINE SULFATE (PF) 2 MG/ML IV SOLN
2.0000 mg | INTRAVENOUS | Status: DC | PRN
  Administered 2021-10-16: 2 mg via INTRAVENOUS
  Filled 2021-10-15: qty 1

## 2021-10-15 MED ORDER — ASPIRIN 81 MG PO CHEW
81.0000 mg | CHEWABLE_TABLET | Freq: Two times a day (BID) | ORAL | Status: DC
  Administered 2021-10-16 – 2021-10-17 (×3): 81 mg via ORAL
  Filled 2021-10-15 (×3): qty 1

## 2021-10-15 NOTE — NC FL2 (Signed)
[place] MEDICAID FL2 LEVEL OF CARE SCREENING TOOL     IDENTIFICATION  Patient Name: Stephen Fitzpatrick Birthdate: Oct 30, 1945 Sex: male Admission Date (Current Location): 10/12/2021  [facility] and [place] Number:  [employer] and Address:  [facility] [address] [place]      Provider Number: [id number]  Attending Physician Name and Address:  Murlean Iba, MD  Relative Name and Phone Number:       Current Level of Care: Hospital Recommended Level of Care: [place] Prior Approval Number:    Date Approved/Denied:   PASRR Number: [id number]  Discharge Plan: SNF    Current Diagnoses: Patient Active Problem List   Diagnosis Date Noted   Closed left hip fracture ([place]) 10/13/2021   COPD (chronic obstructive pulmonary disease) ([place]) 10/13/2021   Anxiety 10/13/2021   Chronic respiratory failure with hypoxia ([place]) 10/13/2021   HTN (hypertension) 10/13/2021   Former tobacco use 10/13/2021   History of cerebrovascular accident (CVA) due to ischemia 10/13/2021   PNA (pneumonia) 02/21/2012   Leukocytosis 02/21/2012   Adenocarcinoma of Left Lower Lobe lung    Nodule of left lower lung     Orientation RESPIRATION BLADDER Height & Weight     Self, Time, Situation, Place  Normal Continent, External catheter Weight: 165 lb 8 oz (75.1 kg) Height:  6\' 3"  (190.5 cm)  BEHAVIORAL SYMPTOMS/MOOD NEUROLOGICAL BOWEL NUTRITION STATUS      Continent Diet  AMBULATORY STATUS COMMUNICATION OF NEEDS Skin   Limited Assist Verbally Normal                       Personal Care Assistance Level of Assistance  Bathing, Feeding, Dressing Bathing Assistance: Independent Feeding assistance: Independent Dressing Assistance: Independent     Functional Limitations Info  Sight, Hearing, Speech Sight Info: Adequate Hearing Info: Adequate Speech Info: Adequate    SPECIAL CARE FACTORS FREQUENCY  PT (By licensed PT), OT (By licensed  OT)     PT Frequency: 5x weekly OT Frequency: 5x weekly            Contractures Contractures Info: Not present    Additional Factors Info  Code Status, Allergies Code Status Info: Full Code Allergies Info: No known allergies           Current Medications (10/15/2021):  This is the current hospital active medication list Current Facility-Administered Medications  Medication Dose Route Frequency Provider Last Rate Last Admin   0.9 %  sodium chloride infusion   Intravenous PRN Wynetta Emery, Clanford L, MD 10 mL/hr at 10/14/21 1544 New Bag at 10/14/21 1544   acetaminophen (TYLENOL) tablet 650 mg  650 mg Oral Q6H PRN Mordecai Rasmussen, MD   650 mg at 10/13/21 1911   Or   acetaminophen (TYLENOL) suppository 650 mg  650 mg Rectal Q6H PRN Mordecai Rasmussen, MD       albuterol (PROVENTIL) (2.5 MG/3ML) 0.083% nebulizer solution 2.5 mg  2.5 mg Nebulization Q6H PRN Mordecai Rasmussen, MD       ALPRAZolam Duanne Moron) tablet 0.5 mg  0.5 mg Oral Daily Larena Glassman A, MD   0.5 mg at 10/15/21 0848   dextrose 5 % and 0.9 % NaCl with KCl 20 mEq/L infusion   Intravenous Continuous Johnson, Clanford L, MD 10 mL/hr at 10/14/21 1047 Rate Change at 10/14/21 1047   fluticasone (FLONASE) 50 MCG/ACT nasal spray 1 spray  1 spray Each Nare Daily Mordecai Rasmussen,  MD   1 spray at 10/15/21 0848   gabapentin (NEURONTIN) capsule 600 mg  600 mg Oral BID Mordecai Rasmussen, MD   600 mg at 10/15/21 0847   heparin injection 5,000 Units  5,000 Units Subcutaneous Q8H Mordecai Rasmussen, MD   5,000 Units at 10/15/21 0559   hydrALAZINE (APRESOLINE) injection 10 mg  10 mg Intravenous Q6H PRN Mordecai Rasmussen, MD       ipratropium-albuterol (DUONEB) 0.5-2.5 (3) MG/3ML nebulizer solution 3 mL  3 mL Nebulization BID Larena Glassman A, MD   3 mL at 10/15/21 0725   methocarbamol (ROBAXIN) 500 mg in dextrose 5 % 50 mL IVPB  500 mg Intravenous Q6H PRN Mordecai Rasmussen, MD   Stopped at 10/14/21 0347   metoprolol tartrate (LOPRESSOR) tablet 50 mg  50 mg Oral BID  Mordecai Rasmussen, MD   50 mg at 10/15/21 0848   morphine (PF) 2 MG/ML injection 2-3 mg  2-3 mg Intravenous Q4H PRN Wynetta Emery, Clanford L, MD   2 mg at 10/15/21 0110   mupirocin ointment (BACTROBAN) 2 % 1 Application  1 Application Nasal BID Mordecai Rasmussen, MD   1 Application at 37/16/96 0849   ondansetron (ZOFRAN) tablet 4 mg  4 mg Oral Q6H PRN Mordecai Rasmussen, MD       Or   ondansetron [facility]) injection 4 mg  4 mg Intravenous Q6H PRN Mordecai Rasmussen, MD   4 mg at 10/14/21 0539   oxyCODONE (Oxy IR/ROXICODONE) immediate release tablet 5 mg  5 mg Oral Q4H PRN Mordecai Rasmussen, MD   5 mg at 10/15/21 0409   pantoprazole (PROTONIX) EC tablet 40 mg  40 mg Oral Daily Mordecai Rasmussen, MD   40 mg at 10/15/21 0848   polyethylene glycol (MIRALAX / GLYCOLAX) packet 17 g  17 g Oral Daily Johnson, Clanford L, MD   17 g at 10/15/21 0847   theophylline (UNIPHYL) 400 MG 24 hr tablet 400 mg  400 mg Oral Daily Larena Glassman A, MD   400 mg at 10/14/21 0801   umeclidinium bromide (INCRUSE ELLIPTA) 62.5 MCG/ACT 1 puff  1 puff Inhalation Daily Mordecai Rasmussen, MD   1 puff at 10/15/21 7893     Discharge Medications: Please see discharge summary for a list of discharge medications.  Relevant Imaging Results:  Relevant Lab Results:   Additional Information SSN: [place], LCSW

## 2021-10-15 NOTE — Progress Notes (Signed)
CSW completed FL2 and faxed patient's clinicals out for review.  Madilyn Fireman, MSW, LCSW Transitions of Care  Clinical Social Worker II [phone]

## 2021-10-15 NOTE — Progress Notes (Signed)
   ORTHOPAEDIC PROGRESS NOTE  s/p Procedure(s): CANNULATED HIP PINNING; CRPP L Hip  DOS: 10/13/2021  SUBJECTIVE: Pain in lateral thigh.  Able to move leg more.  Worked with PT yesterday, took a few steps in the room.  Willing to go to rehab.  Eating and drinking well.   OBJECTIVE: PE:  Alert and oriented No acute distress Surgical dressing is clean, dry and intact Active motion in the EHL/TA Sensation intact to the dorsum of his foot Toes are warm and well perfused.  More movement in the hip Tenderness over lateral hip   Vitals:   10/15/21 0408 10/15/21 0727  BP: (!) 141/82   Pulse: 78   Resp: 20   Temp: 98.5 F (36.9 C)   SpO2: 100% 98%      Latest Ref Rng & Units 10/15/2021    6:52 AM 10/14/2021    6:07 AM 10/13/2021    5:18 AM  CBC  WBC 4.0 - 10.5 K/uL 10.9  13.9  9.0   Hemoglobin 13.0 - 17.0 g/dL 12.2  12.8  12.8   Hematocrit 39.0 - 52.0 % 39.9  42.5  41.5   Platelets 150 - 400 K/uL 141  151  136      ASSESSMENT: Stephen Fitzpatrick is a 76 y.o. male doing well POD#2, waiting for rehab placement.   PLAN: Weightbearing: WBAT LLE Insicional and dressing care: Reinforce dressings as needed Orthopedic device(s): None VTE prophylaxis: Recommend Aspirin 81mg  BID  6 weeks Pain control: As needed; judicious use of narcotics.  Follow - up plan:  F/U 2 weeks.  If DC to rehab facility, sutures can be trimmed at 2 weeks and I can see him in clinic in 6 weeks.  If there are any concerns, he can be seen in follow up at any time.    Contact information:     Jatavia Keltner A. Amedeo Kinsman, MD [address] [place] [address] [place],  [place]  [address] Phone: [phone] Fax: [phone]

## 2021-10-15 NOTE — Progress Notes (Signed)
PROGRESS NOTE   Stephen Fitzpatrick  KGY:[id number] DOB: January 04, 1946 DOA: 10/12/2021 PCP: Adaline Sill, NP   Chief Complaint  Patient presents with   Fall    Left hip pain   Level of care: Telemetry  Brief Admission History:  76 y.o. male with medical history significant of O2 dependent COPD with baseline requirement 4 L/min, hypertension with elevated LVEF but no evidence of diastolic dysfunction on echo 2021, adenocarcinoma of the left lung status post chemoradiation and left VATS procedure in 2012, history of COVID in 2021, prior tobacco use stopped in 2021, incidental finding of ischemic CVA 2012.  Presented with left thigh pain after accidental fall at home.  No syncope or presyncopal symptoms, no shortness of breath or chest pain preceding fall.  In the ER it was revealed that patient had to impacted and displaced left femoral neck fracture.  Orthopedic physician Dr. Colon Branch consulted by Dr. Lanny Cramp in the ER and patient will undergo surgical repair.     Assessment and Plan: * Closed left hip fracture (HCC) -mechanical fall -Left hip impacted and displaced femoral neck fracture -Ortho consulted and had ORIF on 7/7 with Dr. Amedeo Kinsman -PT recommending SNF placement  -Pt now agreeable to go to SNF at this time, TOC working on it  Former tobacco use -- bronchodilators as ordered   HTN (hypertension) -- controlled on home metoprolol 50 mg BID  Chronic respiratory failure with hypoxia ([place]) -- stable; continue bronchodilators  Anxiety -continue xanax  COPD (chronic obstructive pulmonary disease) ([place]) -Continue albuterol, fluticasone, DuoNeb, Incruse and theophylline -No clinical evidence of exacerbation at this time  Adenocarcinoma of Left Lower Lobe lung - s/p chemoradiation and VATS   DVT prophylaxis: transition to asa 81 mg bid x 6 weeks per ortho recs Code Status: full  Family Communication: daughter updated 7/7 Disposition: Status is: Inpatient Remains inpatient  appropriate because: Intensity    Consultants:  orthopedics Procedures:  Cannulated hip pinning; CRPP L Hip 7/7   Subjective: Pt motivated to get ambulatory again and reports pain in left hip with movement, agreeable to SNF now    Objective: Vitals:   10/14/21 2057 10/14/21 2125 10/15/21 0408 10/15/21 0727  BP: (!) 115/54 120/64 (!) 141/82   Pulse: 85 79 78   Resp: 18 16 20    Temp: 98.4 F (36.9 C) 98.2 F (36.8 C) 98.5 F (36.9 C)   TempSrc: Oral Oral Oral   SpO2: 100% 100% 100% 98%  Weight:      Height:        Intake/Output Summary (Last 24 hours) at 10/15/2021 1241 Last data filed at 10/15/2021 0444 Gross per 24 hour  Intake 850.33 ml  Output 2100 ml  Net -1249.67 ml   Filed Weights   10/12/21 2238 10/12/21 2239 10/13/21 0310  Weight: 79.4 kg 79.4 kg 75.1 kg   Examination:  General exam: Appears calm and comfortable  Respiratory system: Clear to auscultation. Respiratory effort normal. Cardiovascular system: normal S1 & S2 heard. No JVD, murmurs, rubs, gallops or clicks. No pedal edema. Gastrointestinal system: Abdomen is nondistended, soft and nontender. No organomegaly or masses felt. Normal bowel sounds heard. Central nervous system: Alert and oriented. No focal neurological deficits. Extremities: bandages clean and dry with good pulses in both feet.  Skin: No rashes, lesions or ulcers. Psychiatry: Judgement and insight appear normal. Mood & affect appropriate.   Data Reviewed: I have personally reviewed following labs and imaging studies  CBC: Recent Labs  Lab 10/13/21 0015 10/13/21  0518 10/14/21 0607 10/15/21 0652  WBC 12.0* 9.0 13.9* 10.9*  NEUTROABS 9.7*  --  11.5*  --   HGB 14.3 12.8* 12.8* 12.2*  HCT 47.6 41.5 42.5 39.9  MCV 88.0 86.5 87.3 86.9  PLT 149* 136* 151 141*    Basic Metabolic Panel: Recent Labs  Lab 10/13/21 0015 10/13/21 0518 10/14/21 0607  NA 139 139 140  K 4.4 3.9 4.6  CL 104 108 106  CO2 26 26 28   GLUCOSE 104* 101* 170*   BUN 15 15 11   CREATININE 0.90 0.73 1.04  CALCIUM 8.2* 7.7* 8.0*    CBG: No results for input(s): "GLUCAP" in the last 168 hours.  Recent Results (from the past 240 hour(s))  Surgical PCR screen     Status: None   Collection Time: 10/13/21  3:22 AM   Specimen: Nasal Mucosa; Nasal Swab  Result Value Ref Range Status   MRSA, PCR NEGATIVE NEGATIVE Final   Staphylococcus aureus NEGATIVE NEGATIVE Final    Comment: (NOTE) The Xpert SA Assay (FDA approved for NASAL specimens in patients 90 years of age and older), is one component of a comprehensive surveillance program. It is not intended to diagnose infection nor to guide or monitor treatment. Performed at [facility], [address] [place], [place] [address]      Radiology Studies: DG HIP UNILAT WITH PELVIS 2-3 VIEWS LEFT  Result Date: 10/13/2021 CLINICAL DATA:  Left hip fracture, insertion of cannulated screws EXAM: DG HIP (WITH OR WITHOUT PELVIS) 2-3V LEFT COMPARISON:  None Available. FINDINGS: Intraoperative images during left femoral neck fixation utilizing 3 cannulated screws. IMPRESSION: Intraoperative images during left femoral neck fixation utilizing 3 cannulated screws. No evidence of immediate complication. Electronically Signed   By: Maurine Simmering M.D.   On: 10/13/2021 14:54   DG HIP UNILAT WITH PELVIS 2-3 VIEWS LEFT  Result Date: 10/13/2021 CLINICAL DATA:  Postop. EXAM: DG HIP (WITH OR WITHOUT PELVIS) 2-3V LEFT COMPARISON:  10/12/2021. FINDINGS: Fixation of the left femoral neck fracture with 3 screws. Improved, near anatomic alignment. No evidence of immediate hardware complication. No joint dislocation. Partially imaged lower lumbar degenerative change. IMPRESSION: Interval fixation of a left femoral neck fracture. No evidence of immediate hardware complication. Electronically Signed   By: Margaretha Sheffield M.D.   On: 10/13/2021 14:46   DG C-Arm 1-60 Min-No Report  Result Date: 10/13/2021 Fluoroscopy was utilized by the  requesting physician.  No radiographic interpretation.    Scheduled Meds:  ALPRAZolam  0.5 mg Oral Daily   fluticasone  1 spray Each Nare Daily   gabapentin  600 mg Oral BID   heparin  5,000 Units Subcutaneous Q8H   ipratropium-albuterol  3 mL Nebulization BID   metoprolol tartrate  50 mg Oral BID   mupirocin ointment  1 Application Nasal BID   pantoprazole  40 mg Oral Daily   polyethylene glycol  17 g Oral Daily   theophylline  400 mg Oral Daily   umeclidinium bromide  1 puff Inhalation Daily   Continuous Infusions:  sodium chloride 10 mL/hr at 10/14/21 1544   dextrose 5 % and 0.9 % NaCl with KCl 20 mEq/L 10 mL/hr at 10/14/21 1047   methocarbamol (ROBAXIN) IV Stopped (10/14/21 0347)    LOS: 2 days   Time spent: 35 mins  [place] Wynetta Emery, MD How to contact the [facility] Attending or Consulting provider [place] or covering provider during after hours 7P -7A, for this patient?  Check the care team in  [facility] and look for a) attending/consulting TRH provider listed and b) the [place] team listed Log into [email or web address] and use [place]'s universal password to access. If you do not have the password, please contact the hospital operator. Locate the [facility] provider you are looking for under [facility] and page to a number that you can be directly reached. If you still have difficulty reaching the provider, please page the [facility] (Director on Call) for the Hospitalists listed on amion for assistance.  10/15/2021, 12:41 PM

## 2021-10-16 ENCOUNTER — Encounter (HOSPITAL_COMMUNITY): Payer: Self-pay | Admitting: Orthopedic Surgery

## 2021-10-16 DIAGNOSIS — J449 Chronic obstructive pulmonary disease, unspecified: Secondary | ICD-10-CM | POA: Diagnosis not present

## 2021-10-16 DIAGNOSIS — J9611 Chronic respiratory failure with hypoxia: Secondary | ICD-10-CM | POA: Diagnosis not present

## 2021-10-16 DIAGNOSIS — S72002D Fracture of unspecified part of neck of left femur, subsequent encounter for closed fracture with routine healing: Secondary | ICD-10-CM | POA: Diagnosis not present

## 2021-10-16 DIAGNOSIS — F419 Anxiety disorder, unspecified: Secondary | ICD-10-CM | POA: Diagnosis not present

## 2021-10-16 MED ORDER — ALUM & MAG HYDROXIDE-SIMETH 200-200-20 MG/5ML PO SUSP
30.0000 mL | ORAL | Status: DC | PRN
  Administered 2021-10-16 (×2): 30 mL via ORAL
  Filled 2021-10-16 (×2): qty 30

## 2021-10-16 NOTE — Progress Notes (Signed)
OT Cancellation Note  Patient Details Name: Stephen Fitzpatrick MRN: [id number] DOB: 06/03/1945   Cancelled Treatment:    Reason Eval/Treat Not Completed: Other (comment). Attempted evaluation x2 this am, on first attempt pt just received his breakfast. On second attempt pt reporting nausea and requesting to wait until later. Will attempt at a later time as schedule allows.    Guadelupe Sabin, OTR/L  [phone] 10/16/2021, 9:33 AM

## 2021-10-16 NOTE — Progress Notes (Signed)
PROGRESS NOTE   Stephen Fitzpatrick  AJO:[id number] DOB: 01-28-1946 DOA: 10/12/2021 PCP: Adaline Sill, NP   Chief Complaint  Patient presents with   Fall    Left hip pain   Level of care: Med-Surg  Brief Admission History:  76 y.o. male with medical history significant of O2 dependent COPD with baseline requirement 4 L/min, hypertension with elevated LVEF but no evidence of diastolic dysfunction on echo 2021, adenocarcinoma of the left lung status post chemoradiation and left VATS procedure in 2012, history of COVID in 2021, prior tobacco use stopped in 2021, incidental finding of ischemic CVA 2012.  Presented with left thigh pain after accidental fall at home.  No syncope or presyncopal symptoms, no shortness of breath or chest pain preceding fall.  In the ER it was revealed that patient had to impacted and displaced left femoral neck fracture.  Orthopedic physician Dr. Colon Branch consulted by Dr. Lanny Cramp in the ER and patient will undergo surgical repair.     Assessment and Plan: * Closed left hip fracture (HCC) -mechanical fall -Left hip impacted and displaced femoral neck fracture -Ortho consulted and had ORIF on 7/7 with Dr. Amedeo Kinsman -PT recommending SNF placement  -Pt now agreeable to go to SNF at this time, TOC working on it  Former tobacco use -- bronchodilators as ordered   HTN (hypertension) -- controlled on home metoprolol 50 mg BID  Chronic respiratory failure with hypoxia ([place]) -- stable; continue bronchodilators  Anxiety -continue xanax  COPD (chronic obstructive pulmonary disease) (HCC) -Continue albuterol, fluticasone, DuoNeb, Incruse and theophylline -No clinical evidence of exacerbation at this time  Adenocarcinoma of Left Lower Lobe lung - s/p chemoradiation and VATS   DVT prophylaxis: transitioned to asa 81 mg bid x 6 weeks per ortho recs Code Status: full  Family Communication: daughter updated 7/7 Disposition: Status is: Inpatient Remains inpatient  appropriate because: Intensity    Consultants:  orthopedics Procedures:  Cannulated hip pinning; CRPP L Hip 7/7 with Dr. Amedeo Kinsman  Subjective: Pt not sleeping well in hospital.  Pain is better controlled as he recovers.    Objective: Vitals:   10/15/21 1938 10/15/21 2029 10/16/21 0240 10/16/21 0714  BP:  (!) 168/86 (!) 158/78   Pulse:  99 82   Resp:  18 20   Temp:  97.9 F (36.6 C) 98 F (36.7 C)   TempSrc:  Oral Oral   SpO2: 98% 100% 99% 99%  Weight:      Height:        Intake/Output Summary (Last 24 hours) at 10/16/2021 1410 Last data filed at 10/16/2021 1119 Gross per 24 hour  Intake 110.03 ml  Output 1700 ml  Net -1589.97 ml   Filed Weights   10/12/21 2238 10/12/21 2239 10/13/21 0310  Weight: 79.4 kg 79.4 kg 75.1 kg   Examination:  General exam: Appears calm and comfortable  Respiratory system: Clear to auscultation. Respiratory effort normal. Cardiovascular system: normal S1 & S2 heard. No JVD, murmurs, rubs, gallops or clicks. No pedal edema. Gastrointestinal system: Abdomen is nondistended, soft and nontender. No organomegaly or masses felt. Normal bowel sounds heard. Central nervous system: Alert and oriented. No focal neurological deficits. Extremities: bandages clean and dry with good pulses in both feet.  He is gaining mobility in left foot and leg daily.  Skin: No rashes, lesions or ulcers. Psychiatry: Judgement and insight appear normal. Mood & affect appropriate.   Data Reviewed: I have personally reviewed following labs and imaging studies  CBC: Recent  Labs  Lab 10/13/21 0015 10/13/21 0518 10/14/21 0607 10/15/21 0652  WBC 12.0* 9.0 13.9* 10.9*  NEUTROABS 9.7*  --  11.5*  --   HGB 14.3 12.8* 12.8* 12.2*  HCT 47.6 41.5 42.5 39.9  MCV 88.0 86.5 87.3 86.9  PLT 149* 136* 151 141*    Basic Metabolic Panel: Recent Labs  Lab 10/13/21 0015 10/13/21 0518 10/14/21 0607  NA 139 139 140  K 4.4 3.9 4.6  CL 104 108 106  CO2 26 26 28   GLUCOSE 104*  101* 170*  BUN 15 15 11   CREATININE 0.90 0.73 1.04  CALCIUM 8.2* 7.7* 8.0*    CBG: No results for input(s): "GLUCAP" in the last 168 hours.  Recent Results (from the past 240 hour(s))  Surgical PCR screen     Status: None   Collection Time: 10/13/21  3:22 AM   Specimen: Nasal Mucosa; Nasal Swab  Result Value Ref Range Status   MRSA, PCR NEGATIVE NEGATIVE Final   Staphylococcus aureus NEGATIVE NEGATIVE Final    Comment: (NOTE) The Xpert SA Assay (FDA approved for NASAL specimens in patients 33 years of age and older), is one component of a comprehensive surveillance program. It is not intended to diagnose infection nor to guide or monitor treatment. Performed at [facility], [address] [place], [place] [address]      Radiology Studies: No results found.  Scheduled Meds:  ALPRAZolam  0.5 mg Oral Daily   aspirin  81 mg Oral BID   fluticasone  1 spray Each Nare Daily   gabapentin  600 mg Oral BID   ipratropium-albuterol  3 mL Nebulization BID   metoprolol tartrate  50 mg Oral BID   mupirocin ointment  1 Application Nasal BID   pantoprazole  40 mg Oral Daily   polyethylene glycol  17 g Oral Daily   theophylline  400 mg Oral Daily   umeclidinium bromide  1 puff Inhalation Daily   Continuous Infusions:  sodium chloride 10 mL/hr at 10/14/21 1544   dextrose 5 % and 0.9 % NaCl with KCl 20 mEq/L 10 mL/hr at 10/14/21 1047   methocarbamol (ROBAXIN) IV Stopped (10/14/21 0347)    LOS: 3 days   Time spent: 35 mins  [place] Seydel Wynetta Emery, MD How to contact the [facility] Attending or Consulting provider [place] or covering provider during after hours [place], for this patient?  Check the care team in [facility] and look for a) attending/consulting TRH provider listed and b) the [facility] team listed Log into [email or web address] and use [place]'s universal password to access. If you do not have the password, please contact the hospital operator. Locate the [facility] provider you are looking for under [facility] and page to a number that you can be directly reached. If you still have difficulty reaching the provider, please page the [facility] (Director on Call) for the Hospitalists listed on amion for assistance.  10/16/2021, 2:10 PM

## 2021-10-16 NOTE — Plan of Care (Signed)
Pt alert and oriented x4. Increasing movement in bed. Reluctant to move out of bed. Pt reports he didn't get up 7/9 but increased movement in bed. Pt has been only taking oxycodone for pain for approx the last 24 hours. It has been effective in controlling pain. Vitals stable. Male wick in place. Pt complaint of indigestion. Maalox ordered and given from general admission orders.  Problem: Education: Goal: Knowledge of General Education information will improve Description: Including pain rating scale, medication(s)/side effects and non-pharmacologic comfort measures Outcome: Progressing   Problem: Health Behavior/Discharge Planning: Goal: Ability to manage health-related needs will improve Outcome: Progressing   Problem: Clinical Measurements: Goal: Ability to maintain clinical measurements within normal limits will improve Outcome: Progressing Goal: Will remain free from infection Outcome: Progressing Goal: Diagnostic test results will improve Outcome: Progressing Goal: Respiratory complications will improve Outcome: Progressing Goal: Cardiovascular complication will be avoided Outcome: Progressing   Problem: Nutrition: Goal: Adequate nutrition will be maintained Outcome: Progressing   Problem: Pain Managment: Goal: General experience of comfort will improve Outcome: Progressing   Problem: Skin Integrity: Goal: Risk for impaired skin integrity will decrease Outcome: Progressing

## 2021-10-16 NOTE — TOC Initial Note (Signed)
Transition of Care [facility]) - Initial/Assessment Note    Patient Details  Name: Stephen Fitzpatrick MRN: [id number] Date of Birth: April 02, 1946  Transition of Care [facility]) CM/SW Contact:    Shade Flood, LCSW Phone Number: 10/16/2021, 2:07 PM  Clinical Narrative:                  Pt admitted from home. PT recommending SNF rehab. Met with pt at bedside today to assess and review dc planning. Discussed PT recommendations with pt and also with pt's son on speaker phone. Pt agreeable to SNF rehab. CMS provider options reviewed. Pt was referred out. Bed offers reviewed with pt who selects [facility].  MD anticipating dc in 1-2 days. Insurance authorization request will be initiated today.  TOC will follow.  Expected Discharge Plan: Skilled Nursing Facility Barriers to Discharge: Continued Medical Work up   Patient Goals and CMS Choice Patient states their goals for this hospitalization and ongoing recovery are:: get better CMS Medicare.gov Compare Post Acute Care list provided to:: Patient Choice offered to / list presented to : Patient  Expected Discharge Plan and Services Expected Discharge Plan: [place] In-house Referral: Clinical Social Work   Post Acute Care Choice: [place] Living arrangements for the past 2 months: [place]                                      Prior Living Arrangements/Services Living arrangements for the past 2 months: Single Family Home Lives with:: Self Patient language and need for interpreter reviewed:: Yes Do you feel safe going back to the place where you live?: Yes      Need for Family Participation in Patient Care: No (Comment)     Criminal Activity/Legal Involvement Pertinent to Current Situation/Hospitalization: No - Comment as needed  Activities of Daily Living Home Assistive Devices/Equipment: Bedside commode/3-in-1, Oxygen, Shower chair without back, Walker (specify type) ADL Screening (condition at  time of admission) Patient's cognitive ability adequate to safely complete daily activities?: Yes Is the patient deaf or have difficulty hearing?: No Does the patient have difficulty seeing, even when wearing glasses/contacts?: No Does the patient have difficulty concentrating, remembering, or making decisions?: No Patient able to express need for assistance with ADLs?: Yes Does the patient have difficulty dressing or bathing?: Yes Independently performs ADLs?: No Communication: Needs assistance Is this a change from baseline?: Change from baseline, expected to last >3 days Dressing (OT): Needs assistance Is this a change from baseline?: Change from baseline, expected to last >3 days Grooming: Needs assistance Is this a change from baseline?: Change from baseline, expected to last >3 days Feeding: Independent Bathing: Needs assistance Is this a change from baseline?: Change from baseline, expected to last >3 days Toileting: Needs assistance Is this a change from baseline?: Change from baseline, expected to last >3days In/Out Bed: Needs assistance Is this a change from baseline?: Change from baseline, expected to last >3 days Walks in Home: Needs assistance Is this a change from baseline?: Change from baseline, expected to last >3 days Does the patient have difficulty walking or climbing stairs?: Yes Weakness of Legs: Left Weakness of Arms/Hands: None  Permission Sought/Granted Permission sought to share information with : Facility Art therapist granted to share information with : Yes, Verbal Permission Granted     Permission granted to share info w AGENCY: [facility]  Emotional Assessment Appearance:: Appears stated age Attitude/Demeanor/Rapport: Engaged Affect (typically observed): Pleasant Orientation: : Oriented to Self, Oriented to Place, Oriented to  Time, Oriented to Situation Alcohol / Substance Use: Not Applicable Psych Involvement: No  (comment)  Admission diagnosis:  Closed left hip fracture ([place]) [S72.002A] Patient Active Problem List   Diagnosis Date Noted   Closed left hip fracture ([place]) 10/13/2021   COPD (chronic obstructive pulmonary disease) ([place]) 10/13/2021   Anxiety 10/13/2021   Chronic respiratory failure with hypoxia ([place]) 10/13/2021   HTN (hypertension) 10/13/2021   Former tobacco use 10/13/2021   History of cerebrovascular accident (CVA) due to ischemia 10/13/2021   PNA (pneumonia) 02/21/2012   Leukocytosis 02/21/2012   Adenocarcinoma of Left Lower Lobe lung    Nodule of left lower lung    PCP:  Adaline Sill, NP Pharmacy:   [id number] [id number]: [phone]: [id number]: [phone]: [phone]    Social Determinants of Health (SDOH) Interventions    Readmission Risk Interventions    10/16/2021    2:06 PM  Readmission Risk Prevention Plan  Transportation Screening Complete  Home Care Screening Complete  Medication Review (RN CM) Complete

## 2021-10-16 NOTE — Progress Notes (Signed)
PT Cancellation Note  Patient Details Name: COLEN ELTZROTH MRN: [id number] DOB: 06/21/1945   Cancelled Treatment:    Reason Eval/Treat Not Completed: Patient declined, no reason specified.  Patient declined therapy in secondary to nausea and feeling sick -RN notified.    3:35 PM, 10/16/21 Lonell Grandchild, MPT Physical Therapist with [facility] [phone] office [phone] mobile phone

## 2021-10-17 DIAGNOSIS — S72002D Fracture of unspecified part of neck of left femur, subsequent encounter for closed fracture with routine healing: Secondary | ICD-10-CM | POA: Diagnosis not present

## 2021-10-17 DIAGNOSIS — J449 Chronic obstructive pulmonary disease, unspecified: Secondary | ICD-10-CM | POA: Diagnosis not present

## 2021-10-17 DIAGNOSIS — J9611 Chronic respiratory failure with hypoxia: Secondary | ICD-10-CM | POA: Diagnosis not present

## 2021-10-17 DIAGNOSIS — Z87891 Personal history of nicotine dependence: Secondary | ICD-10-CM

## 2021-10-17 MED ORDER — OXYCODONE HCL 5 MG PO TABS: 5.0000 mg | ORAL_TABLET | ORAL | 0 refills | Status: AC | PRN

## 2021-10-17 MED ORDER — POLYETHYLENE GLYCOL 3350 17 G PO PACK: 17.0000 g | PACK | Freq: Every day | ORAL | 0 refills | Status: DC

## 2021-10-17 MED ORDER — ASPIRIN 81 MG PO CHEW: 81.0000 mg | CHEWABLE_TABLET | Freq: Two times a day (BID) | ORAL | 0 refills | Status: AC

## 2021-10-17 MED ORDER — GUAIFENESIN-DM 100-10 MG/5ML PO SYRP
5.0000 mL | ORAL_SOLUTION | ORAL | Status: DC | PRN
  Administered 2021-10-17 (×2): 5 mL via ORAL
  Filled 2021-10-17 (×2): qty 5

## 2021-10-17 MED ORDER — BISACODYL 10 MG RE SUPP: 10.0000 mg | Freq: Once | RECTAL | Status: DC

## 2021-10-17 MED ORDER — PANTOPRAZOLE SODIUM 40 MG PO TBEC: 40.0000 mg | DELAYED_RELEASE_TABLET | Freq: Every day | ORAL | Status: DC

## 2021-10-17 MED ORDER — ALPRAZOLAM 0.5 MG PO TABS: 0.5000 mg | ORAL_TABLET | Freq: Every evening | ORAL | 0 refills | Status: DC | PRN

## 2021-10-17 MED ORDER — GABAPENTIN 600 MG PO TABS: 600.0000 mg | ORAL_TABLET | Freq: Every day | ORAL | Status: DC

## 2021-10-17 NOTE — NC FL2 (Signed)
[place] MEDICAID FL2 LEVEL OF CARE SCREENING TOOL     IDENTIFICATION  Patient Name: Stephen Fitzpatrick Birthdate: Jan 28, 1946 Sex: male Admission Date (Current Location): 10/12/2021  [facility] and [place] Number:  [employer] and Address:  [place] [address] [place]      Provider Number: [id number]  Attending Physician Name and Address:  Murlean Iba, MD  Relative Name and Phone Number:       Current Level of Care: Hospital Recommended Level of Care: [place] Prior Approval Number:    Date Approved/Denied:   PASRR Number: [id number]  Discharge Plan: SNF    Current Diagnoses: Patient Active Problem List   Diagnosis Date Noted   Closed left hip fracture ([place]) 10/13/2021   COPD (chronic obstructive pulmonary disease) ([place]) 10/13/2021   Anxiety 10/13/2021   Chronic respiratory failure with hypoxia ([place]) 10/13/2021   HTN (hypertension) 10/13/2021   Former tobacco use 10/13/2021   History of cerebrovascular accident (CVA) due to ischemia 10/13/2021   PNA (pneumonia) 02/21/2012   Leukocytosis 02/21/2012   Adenocarcinoma of Left Lower Lobe lung    Nodule of left lower lung     Orientation RESPIRATION BLADDER Height & Weight     Self, Time, Situation, Place  O2 (4L) Continent, External catheter Weight: 165 lb 8 oz (75.1 kg) Height:  6\' 3"  (190.5 cm)  BEHAVIORAL SYMPTOMS/MOOD NEUROLOGICAL BOWEL NUTRITION STATUS      Continent Diet  AMBULATORY STATUS COMMUNICATION OF NEEDS Skin   Limited Assist Verbally Normal                       Personal Care Assistance Level of Assistance  Bathing, Feeding, Dressing Bathing Assistance: Independent Feeding assistance: Independent Dressing Assistance: Independent     Functional Limitations Info  Sight, Hearing, Speech Sight Info: Adequate Hearing Info: Adequate Speech Info: Adequate    SPECIAL CARE FACTORS FREQUENCY  PT (By licensed PT), OT (By licensed  OT)     PT Frequency: 5x weekly OT Frequency: 5x weekly            Contractures Contractures Info: Not present    Additional Factors Info  Code Status, Allergies Code Status Info: Full Code Allergies Info: No known allergies           Current Medications (10/17/2021):  This is the current hospital active medication list Current Facility-Administered Medications  Medication Dose Route Frequency Provider Last Rate Last Admin   0.9 %  sodium chloride infusion   Intravenous PRN Wynetta Emery, Clanford L, MD 10 mL/hr at 10/14/21 1544 New Bag at 10/14/21 1544   acetaminophen (TYLENOL) tablet 650 mg  650 mg Oral Q6H PRN Mordecai Rasmussen, MD   650 mg at 10/13/21 1911   Or   acetaminophen (TYLENOL) suppository 650 mg  650 mg Rectal Q6H PRN Mordecai Rasmussen, MD       albuterol (PROVENTIL) (2.5 MG/3ML) 0.083% nebulizer solution 2.5 mg  2.5 mg Nebulization Q6H PRN Mordecai Rasmussen, MD       ALPRAZolam Duanne Moron) tablet 0.5 mg  0.5 mg Oral Daily Larena Glassman A, MD   0.5 mg at 10/17/21 0807   alum & mag hydroxide-simeth (MAALOX/MYLANTA) 200-200-20 MG/5ML suspension 30 mL  30 mL Oral Q4H PRN Johnson, Clanford L, MD   30 mL at 10/16/21 2018   aspirin chewable tablet 81 mg  81 mg Oral BID Johnson, Clanford L, MD   81 mg at  10/17/21 0807   bisacodyl (DULCOLAX) suppository 10 mg  10 mg Rectal Once Johnson, Clanford L, MD       dextrose 5 % and 0.9 % NaCl with KCl 20 mEq/L infusion   Intravenous Continuous Johnson, Clanford L, MD 10 mL/hr at 10/14/21 1047 Rate Change at 10/14/21 1047   fluticasone (FLONASE) 50 MCG/ACT nasal spray 1 spray  1 spray Each Nare Daily Mordecai Rasmussen, MD   1 spray at 10/17/21 0808   gabapentin (NEURONTIN) capsule 600 mg  600 mg Oral BID Mordecai Rasmussen, MD   600 mg at 10/17/21 1448   guaiFENesin-dextromethorphan (ROBITUSSIN DM) 100-10 MG/5ML syrup 5 mL  5 mL Oral Q4H PRN Johnson, Clanford L, MD   5 mL at 10/17/21 0544   hydrALAZINE (APRESOLINE) injection 10 mg  10 mg Intravenous Q6H PRN  Mordecai Rasmussen, MD       ipratropium-albuterol (DUONEB) 0.5-2.5 (3) MG/3ML nebulizer solution 3 mL  3 mL Nebulization BID Larena Glassman A, MD   3 mL at 10/17/21 0718   methocarbamol (ROBAXIN) 500 mg in dextrose 5 % 50 mL IVPB  500 mg Intravenous Q6H PRN Mordecai Rasmussen, MD   Stopped at 10/14/21 0347   metoprolol tartrate (LOPRESSOR) tablet 50 mg  50 mg Oral BID Mordecai Rasmussen, MD   50 mg at 10/17/21 0807   morphine (PF) 2 MG/ML injection 2 mg  2 mg Intravenous Q4H PRN Wynetta Emery, Clanford L, MD   2 mg at 10/16/21 1707   mupirocin ointment (BACTROBAN) 2 % 1 Application  1 Application Nasal BID Mordecai Rasmussen, MD   1 Application at 18/56/31 0808   ondansetron (ZOFRAN) tablet 4 mg  4 mg Oral Q6H PRN Mordecai Rasmussen, MD       Or   ondansetron [facility]) injection 4 mg  4 mg Intravenous Q6H PRN Mordecai Rasmussen, MD   4 mg at 10/16/21 1027   oxyCODONE (Oxy IR/ROXICODONE) immediate release tablet 5 mg  5 mg Oral Q4H PRN Mordecai Rasmussen, MD   5 mg at 10/17/21 0544   pantoprazole (PROTONIX) EC tablet 40 mg  40 mg Oral Daily Mordecai Rasmussen, MD   40 mg at 10/17/21 0807   polyethylene glycol (MIRALAX / GLYCOLAX) packet 17 g  17 g Oral Daily Johnson, Clanford L, MD   17 g at 10/17/21 0809   theophylline (UNIPHYL) 400 MG 24 hr tablet 400 mg  400 mg Oral Daily Mordecai Rasmussen, MD   400 mg at 10/17/21 4970   umeclidinium bromide (INCRUSE ELLIPTA) 62.5 MCG/ACT 1 puff  1 puff Inhalation Daily Mordecai Rasmussen, MD   1 puff at 10/17/21 2637     Discharge Medications: Please see discharge summary for a list of discharge medications.  Relevant Imaging Results:  Relevant Lab Results:   Additional Information SSN: [id number]  Shade Flood, LCSW

## 2021-10-17 NOTE — Care Management Important Message (Signed)
Important Message  Patient Details  Name: Stephen Fitzpatrick MRN: [id number] Date of Birth: 12-24-1945   Medicare Important Message Given:  N/A - LOS <3 / Initial given by admissions     Tommy Medal 10/17/2021, 11:25 AM

## 2021-10-17 NOTE — Plan of Care (Signed)
  Problem: Acute Rehab OT Goals (only OT should resolve) Goal: Pt. Will Perform Grooming Flowsheets (Taken 10/17/2021 0952) Pt Will Perform Grooming:  with modified independence  sitting  standing Goal: Pt. Will Perform Lower Body Bathing Flowsheets (Taken 10/17/2021 0952) Pt Will Perform Lower Body Bathing:  with modified independence  with adaptive equipment Goal: Pt. Will Perform Lower Body Dressing Flowsheets (Taken 10/17/2021 0952) Pt Will Perform Lower Body Dressing:  with modified independence  with adaptive equipment Goal: Pt. Will Transfer To Toilet Flowsheets (Taken 10/17/2021 [phone]) Pt Will Transfer to Toilet:  with min guard assist  stand pivot transfer  Dionicio Shelnutt OT, MOT

## 2021-10-17 NOTE — Progress Notes (Signed)
Report called to Ronnie Derby at [facility]

## 2021-10-17 NOTE — Discharge Instructions (Signed)
ORTHOPEDICS DISCHARGE INSTRUCTIONS FROM DR. CAIRNS  Weightbearing: WBAT LLE Insicional and dressing care: Reinforce dressings as needed Orthopedic device(s): None VTE prophylaxis: Recommend Aspirin 81mg  BID  6 weeks Pain control: As needed; judicious use of narcotics.  Follow - up plan:  F/U 2 weeks.  If DC to rehab facility, sutures can be trimmed at 2 weeks and I can see him in clinic in 6 weeks.  If there are any concerns, he can be seen in follow up at any time.    IMPORTANT INFORMATION: PAY CLOSE ATTENTION   PHYSICIAN DISCHARGE INSTRUCTIONS  Follow with Primary care provider  Adaline Sill, NP  and other consultants as instructed by your Hospitalist Physician  [place] IF SYMPTOMS COME BACK, WORSEN OR NEW PROBLEM DEVELOPS   Please note: You were cared for by a hospitalist during your hospital stay. Every effort will be made to forward records to your primary care provider.  You can request that your primary care provider send for your hospital records if they have not received them.  Once you are discharged, your primary care physician will handle any further medical issues. Please note that NO REFILLS for any discharge medications will be authorized once you are discharged, as it is imperative that you return to your primary care physician (or establish a relationship with a primary care physician if you do not have one) for your post hospital discharge needs so that they can reassess your need for medications and monitor your lab values.  Please get a complete blood count and chemistry panel checked by your Primary MD at your next visit, and again as instructed by your Primary MD.  Get Medicines reviewed and adjusted: Please take all your medications with you for your next visit with your Primary MD  Laboratory/radiological data: Please request your Primary MD to go over all hospital tests and procedure/radiological results at the follow up,  please ask your primary care provider to get all Hospital records sent to his/her office.  In some cases, they will be blood work, cultures and biopsy results pending at the time of your discharge. Please request that your primary care provider follow up on these results.  If you are diabetic, please bring your blood sugar readings with you to your follow up appointment with primary care.    Please call and make your follow up appointments as soon as possible.    Also Note the following: If you experience worsening of your admission symptoms, develop shortness of breath, life threatening emergency, suicidal or homicidal thoughts you must seek medical attention immediately by calling 911 or calling your MD immediately  if symptoms less severe.  You must read complete instructions/literature along with all the possible adverse reactions/side effects for all the Medicines you take and that have been prescribed to you. Take any new Medicines after you have completely understood and accpet all the possible adverse reactions/side effects.   Do not drive when taking Pain medications or sleeping medications (Benzodiazepines)  Do not take more than prescribed Pain, Sleep and Anxiety Medications. It is not advisable to combine anxiety,sleep and pain medications without talking with your primary care practitioner  Special Instructions: If you have smoked or chewed Tobacco  in the last 2 yrs please stop smoking, stop any regular Alcohol  and or any Recreational drug use.  Wear Seat belts while driving.  Do not drive if taking any narcotic, mind altering or controlled substances or recreational drugs  or alcohol.

## 2021-10-17 NOTE — TOC Transition Note (Signed)
Transition of Care [facility]) - CM/SW Discharge Note   Patient Details  Name: Stephen Fitzpatrick MRN: [id number] Date of Birth: 1946/02/25  [facility] CM/SW Contact:  Shade Flood, LCSW Phone Number: 10/17/2021, 11:45 AM   Clinical Narrative:     Pt medically stable for dc today per MD. Pt and family remain agreeable for dc to [facility] SNF. DC clinical sent electronically. RN to call report. EMS arranged.  No other TOC needs for dc.   Final next level of care: Skilled Nursing Facility Barriers to Discharge: Barriers Resolved   Patient Goals and CMS Choice Patient states their goals for this hospitalization and ongoing recovery are:: get better CMS Medicare.gov Compare Post Acute Care list provided to:: Patient Choice offered to / list presented to : Patient  Discharge Placement PASRR number recieved: 10/16/21            Patient chooses bed at: [facility] Patient to be transferred to facility by: EMS Name of family member notified: Malinda Patient and family notified of of transfer: 10/17/21  Discharge Plan and Services In-house Referral: Clinical Social Work   Post Acute Care Choice: [place]                               Social Determinants of Health ([place]) Interventions     Readmission Risk Interventions    10/16/2021    2:06 PM  Readmission Risk Prevention Plan  Transportation Screening Complete  Home Care Screening Complete  Medication Review (RN CM) Complete

## 2021-10-17 NOTE — Progress Notes (Signed)
Physical Therapy Treatment Patient Details Name: Stephen Fitzpatrick MRN: [id number] DOB: Nov 20, 1945 Today's Date: 10/17/2021   History of Present Illness GARFIELD COINER is a 76 y.o. male who sustained a mechanical fall and landed on his left hip.  He had immediate pain and inability to ambulate.  In order to restore form and function, I recommended operative fixation.  Benefits and risks of operative and nonoperative management were discussed prior to surgery with the patient and informed consent form was completed.  Specific risks including infection, need for additional surgery, nonunion, malunion, AVN, persistent pain and more severe complications were discussed.  He elected to proceed with surgery.  Surgical consent was finalized.    PT Comments    Patient demonstrates slow labored movement for sitting up at bedside requiring HOB raised and frequent rest breaks due to increasing left hip pain.  Patient demonstrates increased endurance/distance for taking steps at bedside with difficulty advancing LLE due to hip pain and limited mostly due to c/o fatigue and difficulty breathing while on 4 LPM O2.  Patient tolerated sitting up in chair after therapy with son present in room.  Patient will benefit from continued skilled physical therapy in hospital and recommended venue below to increase strength, balance, endurance for safe ADLs and gait.      Recommendations for follow up therapy are one component of a multi-disciplinary discharge planning process, led by the attending physician.  Recommendations may be updated based on patient status, additional functional criteria and insurance authorization.  Follow Up Recommendations  Skilled nursing-short term rehab (<3 hours/day) Can patient physically be transported by private vehicle: No   Assistance Recommended at Discharge Intermittent Supervision/Assistance  Patient can return home with the following A lot of help with walking and/or transfers;A lot  of help with bathing/dressing/bathroom;Assistance with cooking/housework;Assist for transportation;Help with stairs or ramp for entrance   Equipment Recommendations  None recommended by PT    Recommendations for Other Services       Precautions / Restrictions Precautions Precautions: Fall Restrictions Weight Bearing Restrictions: Yes LLE Weight Bearing: Weight bearing as tolerated     Mobility  Bed Mobility Overal bed mobility: Needs Assistance Bed Mobility: Supine to Sit     Supine to sit: Min assist Sit to supine: Mod assist   General bed mobility comments: as per OT notes    Transfers Overall transfer level: Needs assistance Equipment used: Rolling walker (2 wheels) Transfers: Sit to/from Stand, Bed to chair/wheelchair/BSC Sit to Stand: Mod assist   Step pivot transfers: Mod assist       General transfer comment: as per OT notes    Ambulation/Gait Ambulation/Gait assistance: Mod assist Gait Distance (Feet): 6 Feet Assistive device: Rolling walker (2 wheels) Gait Pattern/deviations: Decreased step length - right, Decreased step length - left, Decreased stance time - left, Decreased stride length, Antalgic Gait velocity: decreased     General Gait Details: increased endurance/distance for taking slow labored steps forward/backwards at bed side before having to sit due to fatigue and increasing right hip pain   Stairs             Wheelchair Mobility    Modified Rankin (Stroke Patients Only)       Balance Overall balance assessment: Needs assistance Sitting-balance support: Feet supported, No upper extremity supported Sitting balance-Leahy Scale: Fair Sitting balance - Comments: fair/good seated at EOB   Standing balance support: Reliant on assistive device for balance, During functional activity, Bilateral upper extremity supported Standing balance-Leahy Scale: Poor  Standing balance comment: using RW                             Cognition Arousal/Alertness: Awake/alert Behavior During Therapy: WFL for tasks assessed/performed Overall Cognitive Status: Within Functional Limits for tasks assessed                                          Exercises      General Comments        Pertinent Vitals/Pain Pain Assessment Pain Assessment: Faces Faces Pain Scale: Hurts little more Pain Location: LLE with movement Pain Descriptors / Indicators: Grimacing, Guarding, Sore Pain Intervention(s): Monitored during session, Limited activity within patient's tolerance, Repositioned    Home Living Family/patient expects to be discharged to:: Private residence Living Arrangements: Children Available Help at Discharge: Family;Available 24 hours/day Type of Home: Mobile home Home Access: Stairs to enter Entrance Stairs-Rails: Right;Left;Can reach both Entrance Stairs-Number of Steps: 4 steps back, 6 front   Home Layout: One level Home Equipment: Conservation officer, nature (2 wheels);Cane - single point;BSC/3in1;Shower seat Additional Comments: Taken via PT note.    Prior Function            PT Goals (current goals can now be found in the care plan section) Acute Rehab PT Goals Patient Stated Goal: return home after rehab PT Goal Formulation: With patient/family Time For Goal Achievement: 10/28/21 Potential to Achieve Goals: Good Progress towards PT goals: Progressing toward goals    Frequency    Min 3X/week      PT Plan Current plan remains appropriate    Co-evaluation PT/OT/SLP Co-Evaluation/Treatment: Yes Reason for Co-Treatment: To address functional/ADL transfers PT goals addressed during session: Mobility/safety with mobility;Balance;Proper use of DME;Strengthening/ROM OT goals addressed during session: ADL's and self-care      AM-PAC PT "6 Clicks" Mobility   Outcome Measure  Help needed turning from your back to your side while in a flat bed without using bedrails?: A Little Help needed  moving from lying on your back to sitting on the side of a flat bed without using bedrails?: A Little Help needed moving to and from a bed to a chair (including a wheelchair)?: A Lot Help needed standing up from a chair using your arms (e.g., wheelchair or bedside chair)?: A Lot Help needed to walk in hospital room?: A Lot Help needed climbing 3-5 steps with a railing? : A Lot 6 Click Score: 14    End of Session Equipment Utilized During Treatment: Oxygen Activity Tolerance: Patient tolerated treatment well;Patient limited by fatigue Patient left: in chair;with call bell/phone within reach;with family/visitor present Nurse Communication: Mobility status PT Visit Diagnosis: Unsteadiness on feet (R26.81);Other abnormalities of gait and mobility (R26.89);Muscle weakness (generalized) (M62.81);Pain Pain - Right/Left: Left Pain - part of body: Hip     Time: 4801-6553 PT Time Calculation (min) (ACUTE ONLY): 20 min  Charges:  $Therapeutic Activity: 8-22 mins                     11:40 AM, 10/17/21 Lonell Grandchild, MPT Physical Therapist with [facility] 336 [phone] office [phone] mobile phone

## 2021-10-17 NOTE — Discharge Summary (Addendum)
Physician Discharge Summary  Stephen Fitzpatrick IHK:[id number] DOB: July 18, 1945 DOA: 10/12/2021  PCP: Adaline Sill, NP Orthopedics: Dr. Amedeo Kinsman  Admit date: 10/12/2021 Discharge date: 10/17/2021  Admitted From: Home  Disposition: SNF Rehab   Recommendations for Outpatient Follow-up:  Follow up with Dr. Amedeo Kinsman in 6 weeks Please trim sutures in 2 weeks Please continue aspirin 81 mg twice daily for 6 weeks for DVT prevention Pt is on 4L/min [place] oxygen  ORTHOPEDICS DISCHARGE INSTRUCTIONS FROM DR. CAIRNS  Weightbearing: WBAT LLE Insicional and dressing care: Reinforce dressings as needed Orthopedic device(s): None VTE prophylaxis: Recommend Aspirin 81mg  BID  6 weeks Pain control: As needed; judicious use of narcotics.  Follow - up plan:  F/U 2 weeks.  If DC to rehab facility, sutures can be trimmed at 2 weeks and I can see him in clinic in 6 weeks.  If there are any concerns, he can be seen in follow up at any time.   Discharge Condition: STABLE   CODE STATUS: FULL  DIET: regular    Brief Hospitalization Summary: Please see all hospital notes, images, labs for full details of the hospitalization. Brief Admission History:  76 y.o. male with medical history significant of O2 dependent COPD with baseline requirement 4 L/min, hypertension with elevated LVEF but no evidence of diastolic dysfunction on echo 2021, adenocarcinoma of the left lung status post chemoradiation and left VATS procedure in 2012, history of COVID in 2021, prior tobacco use stopped in 2021, incidental finding of ischemic CVA 2012.  Presented with left thigh pain after accidental fall at home.  No syncope or presyncopal symptoms, no shortness of breath or chest pain preceding fall.  In the ER it was revealed that patient had to impacted and displaced left femoral neck fracture.  Orthopedic physician Dr. Amedeo Kinsman consulted by Dr. Lanny Cramp in the ER and patient will undergo surgical repair.     Assessment and Plan: * Closed left  hip fracture (HCC) -mechanical fall -Left hip impacted and displaced femoral neck fracture -Ortho consulted and had ORIF on 7/7 with Dr. Amedeo Kinsman -PT recommending SNF placement  -Pt now agreeable to go to SNF at this time, TOC working on it   Former tobacco use -- bronchodilators as ordered    HTN (hypertension) -- controlled on home metoprolol 50 mg BID   Chronic respiratory failure with hypoxia  -- stable; continue bronchodilators -- he remains on 4L/min Nasal Cannula    Anxiety -continue xanax   COPD (chronic obstructive pulmonary disease)  -Continue albuterol, fluticasone, DuoNeb, Incruse and theophylline -No clinical evidence of exacerbation at this time   Adenocarcinoma of Left Lower Lobe lung - s/p chemoradiation and VATS     DVT prophylaxis: transitioned to asa 81 mg bid x 6 weeks per ortho recs Code Status: full  Family Communication: daughter updated 7/7 Disposition: Status is: Inpatient Remains inpatient appropriate because: Intensity    Consultants:  orthopedics Procedures:  Cannulated hip pinning; CRPP L Hip 7/7 with Dr. Amedeo Kinsman   Discharge Diagnoses:  Principal Problem:   Closed left hip fracture ([place]) Active Problems:   Adenocarcinoma of Left Lower Lobe lung   COPD (chronic obstructive pulmonary disease) (HCC)   Anxiety   Chronic respiratory failure with hypoxia (HCC)   HTN (hypertension)   Former tobacco use   History of cerebrovascular accident (CVA) due to ischemia   Discharge Instructions:  Allergies as of 10/17/2021   No Known Allergies      Medication List     STOP taking  these medications    Aclidinium Bromide 400 MCG/ACT Aepb Commonly known as: Tudorza Pressair   theophylline 200 MG 12 hr tablet Commonly known as: THEODUR       TAKE these medications    acetaminophen 325 MG tablet Commonly known as: TYLENOL Take 650 mg by mouth every 6 (six) hours as needed for mild pain.   albuterol (2.5 MG/3ML) 0.083% nebulizer  solution Commonly known as: PROVENTIL Take 3 mLs (2.5 mg total) by nebulization every 6 (six) hours as needed for wheezing or shortness of breath. What changed: Another medication with the same name was removed. Continue taking this medication, and follow the directions you see here.   ALPRAZolam 0.5 MG tablet Commonly known as: XANAX Take 1 tablet (0.5 mg total) by mouth at bedtime as needed for anxiety. What changed:  when to take this reasons to take this   aspirin 81 MG chewable tablet Chew 1 tablet (81 mg total) by mouth 2 (two) times daily.   gabapentin 600 MG tablet Commonly known as: NEURONTIN Take 1 tablet (600 mg total) by mouth at bedtime. What changed: when to take this   metoprolol tartrate 50 MG tablet Commonly known as: LOPRESSOR TAKE 1 TABLET TWICE A DAY   oxyCODONE 5 MG immediate release tablet Commonly known as: Oxy IR/ROXICODONE Take 1 tablet (5 mg total) by mouth every 4 (four) hours as needed for up to 5 days for severe pain.   pantoprazole 40 MG tablet Commonly known as: PROTONIX Take 1 tablet (40 mg total) by mouth daily. Start taking on: October 18, 2021   polyethylene glycol 17 g packet Commonly known as: MIRALAX / GLYCOLAX Take 17 g by mouth daily. Start taking on: October 18, 2021   Rhinocort Allergy 32 MCG/ACT nasal spray Generic drug: budesonide Place 1 spray into both nostrils daily.   Trelegy Ellipta 100-62.5-25 MCG/ACT Aepb Generic drug: Fluticasone-Umeclidin-Vilant Inhale 1 puff into the lungs daily.        Contact information for follow-up providers     Mordecai Rasmussen, MD. Schedule an appointment as soon as possible for a visit in 5 week(s).   Specialties: Orthopedic Surgery, Sports Medicine Why: Hospital Follow Up Contact information: [place]. [address] [place] [address] [phone]              Contact information for after-discharge care     Destination     [facility]  Preferred SNF .   Service: Skilled Nursing Contact information: [address] [place] [phone]                    No Known Allergies Allergies as of 10/17/2021   No Known Allergies      Medication List     STOP taking these medications    Aclidinium Bromide 400 MCG/ACT Aepb Commonly known as: Tudorza Pressair   theophylline 200 MG 12 hr tablet Commonly known as: THEODUR       TAKE these medications    acetaminophen 325 MG tablet Commonly known as: TYLENOL Take 650 mg by mouth every 6 (six) hours as needed for mild pain.   albuterol (2.5 MG/3ML) 0.083% nebulizer solution Commonly known as: PROVENTIL Take 3 mLs (2.5 mg total) by nebulization every 6 (six) hours as needed for wheezing or shortness of breath. What changed: Another medication with the same name was removed. Continue taking this medication, and follow the directions you see here.  ALPRAZolam 0.5 MG tablet Commonly known as: XANAX Take 1 tablet (0.5 mg total) by mouth at bedtime as needed for anxiety. What changed:  when to take this reasons to take this   aspirin 81 MG chewable tablet Chew 1 tablet (81 mg total) by mouth 2 (two) times daily.   gabapentin 600 MG tablet Commonly known as: NEURONTIN Take 1 tablet (600 mg total) by mouth at bedtime. What changed: when to take this   metoprolol tartrate 50 MG tablet Commonly known as: LOPRESSOR TAKE 1 TABLET TWICE A DAY   oxyCODONE 5 MG immediate release tablet Commonly known as: Oxy IR/ROXICODONE Take 1 tablet (5 mg total) by mouth every 4 (four) hours as needed for up to 5 days for severe pain.   pantoprazole 40 MG tablet Commonly known as: PROTONIX Take 1 tablet (40 mg total) by mouth daily. Start taking on: October 18, 2021   polyethylene glycol 17 g packet Commonly known as: MIRALAX / GLYCOLAX Take 17 g by mouth daily. Start taking on: October 18, 2021   Rhinocort Allergy 32 MCG/ACT nasal  spray Generic drug: budesonide Place 1 spray into both nostrils daily.   Trelegy Ellipta 100-62.5-25 MCG/ACT Aepb Generic drug: Fluticasone-Umeclidin-Vilant Inhale 1 puff into the lungs daily.        Procedures/Studies: DG HIP UNILAT WITH PELVIS 2-3 VIEWS LEFT  Result Date: 10/13/2021 CLINICAL DATA:  Left hip fracture, insertion of cannulated screws EXAM: DG HIP (WITH OR WITHOUT PELVIS) 2-3V LEFT COMPARISON:  None Available. FINDINGS: Intraoperative images during left femoral neck fixation utilizing 3 cannulated screws. IMPRESSION: Intraoperative images during left femoral neck fixation utilizing 3 cannulated screws. No evidence of immediate complication. Electronically Signed   By: Maurine Simmering M.D.   On: 10/13/2021 14:54   DG HIP UNILAT WITH PELVIS 2-3 VIEWS LEFT  Result Date: 10/13/2021 CLINICAL DATA:  Postop. EXAM: DG HIP (WITH OR WITHOUT PELVIS) 2-3V LEFT COMPARISON:  10/12/2021. FINDINGS: Fixation of the left femoral neck fracture with 3 screws. Improved, near anatomic alignment. No evidence of immediate hardware complication. No joint dislocation. Partially imaged lower lumbar degenerative change. IMPRESSION: Interval fixation of a left femoral neck fracture. No evidence of immediate hardware complication. Electronically Signed   By: Margaretha Sheffield M.D.   On: 10/13/2021 14:46   DG C-Arm 1-60 Min-No Report  Result Date: 10/13/2021 Fluoroscopy was utilized by the requesting physician.  No radiographic interpretation.   DG Chest Port 1 View  Result Date: 10/13/2021 CLINICAL DATA:  Preop evaluation for upcoming hip surgery EXAM: PORTABLE CHEST 1 VIEW COMPARISON:  10/04/2017, CT from 02/25/2012 FINDINGS: Cardiac shadow is within normal limits. Aortic calcifications are seen. Lungs are hyperinflated with diffuse emphysematous changes. Some architectural distortion is noted in the left perihilar region slightly greater than that seen on the prior exam which may represent some progressive  scarring. No sizable effusion is seen. No focal confluent infiltrate is noted. IMPRESSION: Progressive scarring in the left perihilar region likely related to known history of lung carcinoma and treatment. Need for further evaluation can be determined on a clinical basis. Emphysematous changes. Electronically Signed   By: Inez Catalina M.D.   On: 10/13/2021 01:24   DG Hip Unilat W or Wo Pelvis 2-3 Views Left  Result Date: 10/12/2021 CLINICAL DATA:  s/p fall EXAM: DG HIP (WITH OR WITHOUT PELVIS) 2-3V LEFT COMPARISON:  None Available. FINDINGS: Poorly visualized acute impacted and displaced left femoral neck fracture. Frontal view of the right hip grossly unremarkable. No evidence of  acute displaced fracture or diastasis of the bones of the pelvis. There is no evidence of severe arthropathy or other focal bone abnormality. Atherosclerotic plaque. IMPRESSION: 1. Poorly visualized acute impacted and displaced left femoral neck fracture 2.  Frontal view of the right hip grossly unremarkable. 3.  Aortic Atherosclerosis (ICD10-I70.0). Electronically Signed   By: Iven Finn M.D.   On: 10/12/2021 23:33     Subjective: Pt reports that pain is improving and he is able to ambulate more.    Discharge Exam: Vitals:   10/17/21 0505 10/17/21 0718  BP: (!) 144/96   Pulse: 77   Resp: 19   Temp: 97.8 F (36.6 C)   SpO2: 100% 99%   Vitals:   10/16/21 2016 10/16/21 2047 10/17/21 0505 10/17/21 0718  BP: (!) 157/88  (!) 144/96   Pulse: 83  77   Resp: 20  19   Temp: 98.2 F (36.8 C)  97.8 F (36.6 C)   TempSrc: Oral     SpO2: 100% 97% 100% 99%  Weight:      Height:       General: Pt is alert, awake, not in acute distress Cardiovascular: normal S1/S2 +, no rubs, no gallops Respiratory: CTA bilaterally, no wheezing, no rhonchi Abdominal: Soft, NT, ND, bowel sounds + Extremities: wounds clean and dry and intact.  Good peripheral pulses. no edema, no cyanosis   The results of significant diagnostics  from this hospitalization (including imaging, microbiology, ancillary and laboratory) are listed below for reference.     Microbiology: Recent Results (from the past 240 hour(s))  Surgical PCR screen     Status: None   Collection Time: 10/13/21  3:22 AM   Specimen: Nasal Mucosa; Nasal Swab  Result Value Ref Range Status   MRSA, PCR NEGATIVE NEGATIVE Final   Staphylococcus aureus NEGATIVE NEGATIVE Final    Comment: (NOTE) The Xpert SA Assay (FDA approved for NASAL specimens in patients 100 years of age and older), is one component of a comprehensive surveillance program. It is not intended to diagnose infection nor to guide or monitor treatment. Performed at [facility], [address], [place], [place] [address]      Labs: BNP (last 3 results) No results for input(s): "BNP" in the last 8760 hours. Basic Metabolic Panel: Recent Labs  Lab 10/13/21 0015 10/13/21 0518 10/14/21 0607  NA 139 139 140  K 4.4 3.9 4.6  CL 104 108 106  CO2 26 26 28   GLUCOSE 104* 101* 170*  BUN 15 15 11   CREATININE 0.90 0.73 1.04  CALCIUM 8.2* 7.7* 8.0*   Liver Function Tests: Recent Labs  Lab 10/13/21 0518 10/14/21 0607  AST 14* 18  ALT 12 10  ALKPHOS 65 60  BILITOT 0.5 0.4  PROT 6.0* 6.1*  ALBUMIN 3.2* 3.1*   No results for input(s): "LIPASE", "AMYLASE" in the last 168 hours. No results for input(s): "AMMONIA" in the last 168 hours. CBC: Recent Labs  Lab 10/13/21 0015 10/13/21 0518 10/14/21 0607 10/15/21 0652  WBC 12.0* 9.0 13.9* 10.9*  NEUTROABS 9.7*  --  11.5*  --   HGB 14.3 12.8* 12.8* 12.2*  HCT 47.6 41.5 42.5 39.9  MCV 88.0 86.5 87.3 86.9  PLT 149* 136* 151 141*   Cardiac Enzymes: No results for input(s): "CKTOTAL", "CKMB", "CKMBINDEX", "TROPONINI" in the last 168 hours. BNP: Invalid input(s): "POCBNP" CBG: No results for input(s): "GLUCAP" in the last 168 hours. D-Dimer No results for input(s): "DDIMER" in the last 72 hours. Hgb  A1c No results for input(s):  "HGBA1C" in the last 72 hours. Lipid Profile No results for input(s): "CHOL", "HDL", "LDLCALC", "TRIG", "CHOLHDL", "LDLDIRECT" in the last 72 hours. Thyroid function studies No results for input(s): "TSH", "T4TOTAL", "T3FREE", "THYROIDAB" in the last 72 hours.  Invalid input(s): "FREET3" Anemia work up No results for input(s): "VITAMINB12", "FOLATE", "FERRITIN", "TIBC", "IRON", "RETICCTPCT" in the last 72 hours. Urinalysis    Component Value Date/Time   COLORURINE YELLOW 10/13/2021 0311   APPEARANCEUR CLEAR 10/13/2021 0311   LABSPEC 1.012 10/13/2021 0311   PHURINE 5.0 10/13/2021 0311   GLUCOSEU NEGATIVE 10/13/2021 0311   HGBUR NEGATIVE 10/13/2021 0311   BILIRUBINUR NEGATIVE 10/13/2021 0311   KETONESUR 20 (A) 10/13/2021 0311   PROTEINUR NEGATIVE 10/13/2021 0311   UROBILINOGEN 1.0 02/21/2012 1644   NITRITE NEGATIVE 10/13/2021 0311   LEUKOCYTESUR NEGATIVE 10/13/2021 0311   Sepsis Labs Recent Labs  Lab 10/13/21 0015 10/13/21 0518 10/14/21 0607 10/15/21 0652  WBC 12.0* 9.0 13.9* 10.9*   Microbiology Recent Results (from the past 240 hour(s))  Surgical PCR screen     Status: None   Collection Time: 10/13/21  3:22 AM   Specimen: Nasal Mucosa; Nasal Swab  Result Value Ref Range Status   MRSA, PCR NEGATIVE NEGATIVE Final   Staphylococcus aureus NEGATIVE NEGATIVE Final    Comment: (NOTE) The Xpert SA Assay (FDA approved for NASAL specimens in patients 50 years of age and older), is one component of a comprehensive surveillance program. It is not intended to diagnose infection nor to guide or monitor treatment. Performed at [facility], [address], [place], [place] [address]    Time coordinating discharge:  37 mins   SIGNED:  Irwin Brakeman, MD  [facility] 10/17/2021, 10:42 AM How to contact the [facility] Attending or Consulting provider [place] or covering provider during after hours [place], for this patient?  Check the care team in [facility] and look for a)  attending/consulting [facility] provider listed and b) the [facility] team listed Log into [email or web address] and use [place] universal password to access. If you do not have the password, please contact the hospital operator. Locate the [facility] provider you are looking for under [facility] and page to a number that you can be directly reached. If you still have difficulty reaching the provider, please page the [facility] (Director on Call) for the Hospitalists listed on amion for assistance.

## 2021-10-17 NOTE — Evaluation (Signed)
Occupational Therapy Evaluation Patient Details Name: Stephen Fitzpatrick MRN: [id number] DOB: 1945-07-27 Today's Date: 10/17/2021   History of Present Illness Stephen Fitzpatrick is a 76 y.o. male who sustained a mechanical fall and landed on his left hip.  He had immediate pain and inability to ambulate.  In order to restore form and function, I recommended operative fixation.  Benefits and risks of operative and nonoperative management were discussed prior to surgery with the patient and informed consent form was completed.  Specific risks including infection, need for additional surgery, nonunion, malunion, AVN, persistent pain and more severe complications were discussed.  He elected to proceed with surgery.  Surgical consent was finalized.   Clinical Impression   Pt agreeable to OT and PT co-evaluation. Pt presents with slow labored movement for bed mobility with minimal assist. Pt able to stand and transfer from EOB to chair with poor balance, use of RW, and moderate assistance. Once seated pt can partially complete lower body dressing but not completely in order to maintain precautions. Good B UE strength. Pt will benefit from continued OT in the hospital and recommended venue below to increase strength, balance, and endurance for safe ADL's.         Recommendations for follow up therapy are one component of a multi-disciplinary discharge planning process, led by the attending physician.  Recommendations may be updated based on patient status, additional functional criteria and insurance authorization.   Follow Up Recommendations  Skilled nursing-short term rehab (<3 hours/day)    Assistance Recommended at Discharge Intermittent Supervision/Assistance  Patient can return home with the following A lot of help with walking and/or transfers;A lot of help with bathing/dressing/bathroom;Assistance with cooking/housework;Assist for transportation;Help with stairs or ramp for entrance    Functional  Status Assessment  Patient has had a recent decline in their functional status and demonstrates the ability to make significant improvements in function in a reasonable and predictable amount of time.  Equipment Recommendations  None recommended by OT    Recommendations for Other Services       Precautions / Restrictions Precautions Precautions: Fall Restrictions Weight Bearing Restrictions: No LLE Weight Bearing: Weight bearing as tolerated      Mobility Bed Mobility Overal bed mobility: Needs Assistance Bed Mobility: Supine to Sit     Supine to sit: Min assist     General bed mobility comments: Assit only to scoot to R side once at EOB, but much time and labored movement.    Transfers Overall transfer level: Needs assistance Equipment used: Rolling walker (2 wheels) Transfers: Sit to/from Stand, Bed to chair/wheelchair/BSC Sit to Stand: Mod assist     Step pivot transfers: Mod assist     General transfer comment: Slow labored movement with pt able to take a few steps forward and backwards from EOB before transfering to chair.      Balance Overall balance assessment: Needs assistance Sitting-balance support: Bilateral upper extremity supported, Feet supported Sitting balance-Leahy Scale: Fair Sitting balance - Comments: fair to good seated EOB   Standing balance support: Bilateral upper extremity supported, Reliant on assistive device for balance Standing balance-Leahy Scale: Poor Standing balance comment: requires assist and RW                           ADL either performed or assessed with clinical judgement   ADL Overall ADL's : Needs assistance/impaired     Grooming: Set up;Sitting  Lower Body Bathing: Moderate assistance;Sitting/lateral leans   Upper Body Dressing : Set up;Sitting   Lower Body Dressing: Moderate assistance;Sitting/lateral leans Lower Body Dressing Details (indicate cue type and reason): Pt able to doff R sock  with assist to start on foot to don. L sock not attempted due to precautions and pain. Toilet Transfer: Moderate assistance;Stand-pivot;Rolling walker (2 wheels) Toilet Transfer Details (indicate cue type and reason): simulated via EOB to chair transfer with RW Toileting- Clothing Manipulation and Hygiene: Set up;Sitting/lateral lean;Min guard               Vision Baseline Vision/History: 0 No visual deficits Ability to See in Adequate Light: 0 Adequate Patient Visual Report: No change from baseline Vision Assessment?: No apparent visual deficits                Pertinent Vitals/Pain Pain Assessment Pain Assessment: Faces Faces Pain Scale: Hurts a little bit Pain Location: L hip Pain Descriptors / Indicators: Guarding Pain Intervention(s): Limited activity within patient's tolerance, Monitored during session, Repositioned     Hand Dominance Right   Extremity/Trunk Assessment Upper Extremity Assessment Upper Extremity Assessment: Overall WFL for tasks assessed   Lower Extremity Assessment Lower Extremity Assessment: Defer to PT evaluation   Cervical / Trunk Assessment Cervical / Trunk Assessment: Normal   Communication Communication Communication: No difficulties   Cognition Arousal/Alertness: Awake/alert Behavior During Therapy: WFL for tasks assessed/performed Overall Cognitive Status: Within Functional Limits for tasks assessed                                                        Home Living Family/patient expects to be discharged to:: Private residence Living Arrangements: Children Available Help at Discharge: Family;Available 24 hours/day Type of Home: Mobile home Home Access: Stairs to enter Entrance Stairs-Number of Steps: 4 steps back, 6 front Entrance Stairs-Rails: Right;Left;Can reach both Home Layout: One level     Bathroom Shower/Tub: Occupational psychologist: Standard Bathroom Accessibility: Yes   Home  Equipment: Conservation officer, nature (2 wheels);Cane - single point;BSC/3in1;Shower seat   Additional Comments: Taken via PT note.      Prior Functioning/Environment Prior Level of Function : Needs assist       Physical Assist : ADLs (physical)   ADLs (physical): IADLs Mobility Comments: household ambulation with RW ADLs Comments: Independent with basic ADL, family assist with IADL's.        OT Problem List: Decreased range of motion;Decreased activity tolerance;Impaired balance (sitting and/or standing)      OT Treatment/Interventions: Self-care/ADL training;Therapeutic exercise;DME and/or AE instruction;Therapeutic activities;Balance training;Patient/family education    OT Goals(Current goals can be found in the care plan section) Acute Rehab OT Goals Patient Stated Goal: Get stronger at rehab. OT Goal Formulation: With patient Time For Goal Achievement: 10/31/21 Potential to Achieve Goals: Good  OT Frequency: Min 1X/week    Co-evaluation PT/OT/SLP Co-Evaluation/Treatment: Yes Reason for Co-Treatment: To address functional/ADL transfers   OT goals addressed during session: ADL's and self-care                       End of Session Equipment Utilized During Treatment: Rolling walker (2 wheels)  Activity Tolerance: Patient tolerated treatment well Patient left: in chair;with call bell/phone within reach  OT Visit Diagnosis: Unsteadiness on feet (R26.81);Other abnormalities of  gait and mobility (R26.89);Muscle weakness (generalized) (M62.81)                Time: 4136-4383 OT Time Calculation (min): 17 min Charges:  OT General Charges $OT Visit: 1 Visit OT Evaluation $OT Eval Low Complexity: 1 Low  Symon Norwood OT, MOT  Larey Seat 10/17/2021, 9:49 AM

## 2021-11-06 ENCOUNTER — Telehealth: Payer: Self-pay | Admitting: Orthopedic Surgery

## 2021-11-06 NOTE — Telephone Encounter (Signed)
Call received from [place] at [facility], requesting verbal orders; states has been discharged to home from facility:  - for nurse only, 1 time per week x 4 weeks  - then 1 time every other week x 5 weeks    - please advise*     * aware that Dr Amedeo Kinsman and Dr Gerrit Heck nurse are not in clinic at this time.

## 2021-11-07 NOTE — Telephone Encounter (Signed)
Called Katie and order given for nurse.

## 2021-11-08 ENCOUNTER — Encounter: Payer: Medicare Other | Admitting: Orthopedic Surgery

## 2021-11-08 ENCOUNTER — Telehealth: Payer: Self-pay | Admitting: Orthopedic Surgery

## 2021-11-08 NOTE — Telephone Encounter (Signed)
Call received from Amy at [facility], requesting verbal orders, for home therapy:  2 times per week x 4 weeks, to work on left lower extremity strength, transfers, gait, balance, and to set up home exercise program. Please call at [phone]

## 2021-11-09 ENCOUNTER — Telehealth: Payer: Self-pay | Admitting: Orthopedic Surgery

## 2021-11-09 NOTE — Telephone Encounter (Signed)
Call received from [place], [facility],ph# [phone], requesting verbal orders for occupational therapy for:  1 week x1  2 weeks x2  1 week x1

## 2021-11-09 NOTE — Telephone Encounter (Signed)
LVM w/ verbal order for therapy.

## 2021-11-10 NOTE — Telephone Encounter (Signed)
Verbal order called in to [facility].

## 2023-01-03 ENCOUNTER — Other Ambulatory Visit: Payer: Self-pay

## 2023-01-03 ENCOUNTER — Encounter (HOSPITAL_COMMUNITY): Payer: Self-pay | Admitting: *Deleted

## 2023-01-03 ENCOUNTER — Emergency Department (HOSPITAL_COMMUNITY): Payer: Medicare HMO

## 2023-01-03 ENCOUNTER — Inpatient Hospital Stay (HOSPITAL_COMMUNITY)
Admission: EM | Admit: 2023-01-03 | Discharge: 2023-01-09 | DRG: 871 | Disposition: A | Discharge status: Home Health | Payer: Medicare HMO | Attending: Family Medicine | Admitting: Family Medicine

## 2023-01-03 DIAGNOSIS — J9611 Chronic respiratory failure with hypoxia: Secondary | ICD-10-CM | POA: Diagnosis present

## 2023-01-03 DIAGNOSIS — I509 Heart failure, unspecified: Secondary | ICD-10-CM | POA: Diagnosis present

## 2023-01-03 DIAGNOSIS — Z681 Body mass index (BMI) 19 or less, adult: Secondary | ICD-10-CM | POA: Diagnosis not present

## 2023-01-03 DIAGNOSIS — Z7951 Long term (current) use of inhaled steroids: Secondary | ICD-10-CM | POA: Diagnosis not present

## 2023-01-03 DIAGNOSIS — I110 Hypertensive heart disease with heart failure: Secondary | ICD-10-CM | POA: Diagnosis present

## 2023-01-03 DIAGNOSIS — A419 Sepsis, unspecified organism: Secondary | ICD-10-CM | POA: Diagnosis not present

## 2023-01-03 DIAGNOSIS — K5641 Fecal impaction: Secondary | ICD-10-CM | POA: Diagnosis present

## 2023-01-03 DIAGNOSIS — N136 Pyonephrosis: Secondary | ICD-10-CM | POA: Diagnosis present

## 2023-01-03 DIAGNOSIS — I10 Essential (primary) hypertension: Secondary | ICD-10-CM | POA: Diagnosis present

## 2023-01-03 DIAGNOSIS — A4150 Gram-negative sepsis, unspecified: Secondary | ICD-10-CM | POA: Diagnosis not present

## 2023-01-03 DIAGNOSIS — N390 Urinary tract infection, site not specified: Secondary | ICD-10-CM | POA: Diagnosis not present

## 2023-01-03 DIAGNOSIS — Z79899 Other long term (current) drug therapy: Secondary | ICD-10-CM

## 2023-01-03 DIAGNOSIS — F419 Anxiety disorder, unspecified: Secondary | ICD-10-CM | POA: Diagnosis present

## 2023-01-03 DIAGNOSIS — J189 Pneumonia, unspecified organism: Secondary | ICD-10-CM | POA: Diagnosis present

## 2023-01-03 DIAGNOSIS — J440 Chronic obstructive pulmonary disease with acute lower respiratory infection: Secondary | ICD-10-CM | POA: Diagnosis present

## 2023-01-03 DIAGNOSIS — E43 Unspecified severe protein-calorie malnutrition: Secondary | ICD-10-CM | POA: Diagnosis present

## 2023-01-03 DIAGNOSIS — A4152 Sepsis due to Pseudomonas: Principal | ICD-10-CM | POA: Diagnosis present

## 2023-01-03 DIAGNOSIS — R6510 Systemic inflammatory response syndrome (SIRS) of non-infectious origin without acute organ dysfunction: Secondary | ICD-10-CM | POA: Diagnosis not present

## 2023-01-03 DIAGNOSIS — Z85118 Personal history of other malignant neoplasm of bronchus and lung: Secondary | ICD-10-CM | POA: Diagnosis not present

## 2023-01-03 DIAGNOSIS — N1330 Unspecified hydronephrosis: Secondary | ICD-10-CM | POA: Insufficient documentation

## 2023-01-03 DIAGNOSIS — Z1152 Encounter for screening for COVID-19: Secondary | ICD-10-CM | POA: Diagnosis not present

## 2023-01-03 DIAGNOSIS — Z9981 Dependence on supplemental oxygen: Secondary | ICD-10-CM | POA: Diagnosis not present

## 2023-01-03 DIAGNOSIS — J449 Chronic obstructive pulmonary disease, unspecified: Secondary | ICD-10-CM | POA: Diagnosis not present

## 2023-01-03 DIAGNOSIS — Z87891 Personal history of nicotine dependence: Secondary | ICD-10-CM | POA: Diagnosis not present

## 2023-01-03 DIAGNOSIS — R531 Weakness: Secondary | ICD-10-CM | POA: Diagnosis present

## 2023-01-03 DIAGNOSIS — K219 Gastro-esophageal reflux disease without esophagitis: Secondary | ICD-10-CM | POA: Insufficient documentation

## 2023-01-03 DIAGNOSIS — N135 Crossing vessel and stricture of ureter without hydronephrosis: Secondary | ICD-10-CM

## 2023-01-03 DIAGNOSIS — N131 Hydronephrosis with ureteral stricture, not elsewhere classified: Secondary | ICD-10-CM | POA: Diagnosis not present

## 2023-01-03 LAB — CBC WITH DIFFERENTIAL/PLATELET
Abs Immature Granulocytes: 1.19 10*3/uL — ABNORMAL HIGH (ref 0.00–0.07)
Basophils Absolute: 0.1 10*3/uL (ref 0.0–0.1)
Basophils Relative: 1 %
Eosinophils Absolute: 0.4 10*3/uL (ref 0.0–0.5)
Eosinophils Relative: 2 %
HCT: 40.7 % (ref 39.0–52.0)
Hemoglobin: 12.6 g/dL — ABNORMAL LOW (ref 13.0–17.0)
Immature Granulocytes: 5 %
Lymphocytes Relative: 4 %
Lymphs Abs: 1 10*3/uL (ref 0.7–4.0)
MCH: 25 pg — ABNORMAL LOW (ref 26.0–34.0)
MCHC: 31 g/dL (ref 30.0–36.0)
MCV: 80.8 fL (ref 80.0–100.0)
Monocytes Absolute: 2.3 10*3/uL — ABNORMAL HIGH (ref 0.1–1.0)
Monocytes Relative: 10 %
Neutro Abs: 18.8 10*3/uL — ABNORMAL HIGH (ref 1.7–7.7)
Neutrophils Relative %: 78 %
Platelets: 229 10*3/uL (ref 150–400)
RBC: 5.04 MIL/uL (ref 4.22–5.81)
RDW: 15.4 % (ref 11.5–15.5)
WBC: 23.8 10*3/uL — ABNORMAL HIGH (ref 4.0–10.5)
nRBC: 0 % (ref 0.0–0.2)

## 2023-01-03 LAB — LACTIC ACID, PLASMA
Lactic Acid, Venous: 1.6 mmol/L (ref 0.5–1.9)
Lactic Acid, Venous: 2 mmol/L (ref 0.5–1.9)

## 2023-01-03 LAB — COMPREHENSIVE METABOLIC PANEL
ALT: 13 U/L (ref 0–44)
AST: 17 U/L (ref 15–41)
Albumin: 2.7 g/dL — ABNORMAL LOW (ref 3.5–5.0)
Alkaline Phosphatase: 69 U/L (ref 38–126)
Anion gap: 10 (ref 5–15)
BUN: 44 mg/dL — ABNORMAL HIGH (ref 8–23)
CO2: 29 mmol/L (ref 22–32)
Calcium: 7.9 mg/dL — ABNORMAL LOW (ref 8.9–10.3)
Chloride: 99 mmol/L (ref 98–111)
Creatinine, Ser: 1.17 mg/dL (ref 0.61–1.24)
GFR, Estimated: >60 mL/min (ref >60)
Glucose, Bld: 124 mg/dL — ABNORMAL HIGH (ref 70–99)
Potassium: 3.9 mmol/L (ref 3.5–5.1)
Sodium: 138 mmol/L (ref 135–145)
Total Bilirubin: 0.7 mg/dL (ref 0.3–1.2)
Total Protein: 6 g/dL — ABNORMAL LOW (ref 6.5–8.1)

## 2023-01-03 LAB — RESP PANEL BY RT-PCR (RSV, FLU A&B, COVID)  RVPGX2
Influenza A by PCR: NEGATIVE
Influenza B by PCR: NEGATIVE
Resp Syncytial Virus by PCR: NEGATIVE
SARS Coronavirus 2 by RT PCR: NEGATIVE

## 2023-01-03 LAB — CULTURE, BLOOD (ROUTINE X 2)

## 2023-01-03 LAB — APTT: aPTT: 28 seconds (ref 24–36)

## 2023-01-03 LAB — LIPASE, BLOOD: Lipase: 43 U/L (ref 11–51)

## 2023-01-03 LAB — PROTIME-INR
INR: 1.2 (ref 0.8–1.2)
Prothrombin Time: 15.2 seconds (ref 11.4–15.2)

## 2023-01-03 MED ORDER — PIPERACILLIN-TAZOBACTAM 3.375 G IVPB 30 MIN
3.3750 g | Freq: Once | INTRAVENOUS | Status: AC
  Administered 2023-01-03: 3.375 g via INTRAVENOUS
  Filled 2023-01-03: qty 50

## 2023-01-03 MED ORDER — ONDANSETRON HCL 4 MG/2ML IJ SOLN
4.0000 mg | Freq: Once | INTRAMUSCULAR | Status: AC
  Administered 2023-01-03: 4 mg via INTRAVENOUS
  Filled 2023-01-03: qty 2

## 2023-01-03 MED ORDER — IPRATROPIUM-ALBUTEROL 0.5-2.5 (3) MG/3ML IN SOLN
3.0000 mL | Freq: Once | RESPIRATORY_TRACT | Status: AC
  Administered 2023-01-03: 3 mL via RESPIRATORY_TRACT
  Filled 2023-01-03: qty 3

## 2023-01-03 MED ORDER — LACTATED RINGERS IV BOLUS
1000.0000 mL | Freq: Once | INTRAVENOUS | Status: AC
  Administered 2023-01-03: 1000 mL via INTRAVENOUS

## 2023-01-03 MED ORDER — VANCOMYCIN HCL 1750 MG/350ML IV SOLN
1750.0000 mg | Freq: Once | INTRAVENOUS | Status: AC
  Administered 2023-01-03: 1750 mg via INTRAVENOUS
  Filled 2023-01-03: qty 350

## 2023-01-03 MED ORDER — PIPERACILLIN-TAZOBACTAM 3.375 G IVPB
3.3750 g | Freq: Three times a day (TID) | INTRAVENOUS | Status: AC
  Administered 2023-01-04 – 2023-01-07 (×12): 3.375 g via INTRAVENOUS
  Filled 2023-01-03 (×12): qty 50

## 2023-01-03 MED ORDER — LACTATED RINGERS IV SOLN: INTRAVENOUS | Status: DC

## 2023-01-03 MED ORDER — IOHEXOL 300 MG/ML  SOLN
100.0000 mL | Freq: Once | INTRAMUSCULAR | Status: AC | PRN
  Administered 2023-01-03: 100 mL via INTRAVENOUS

## 2023-01-03 MED ORDER — PIPERACILLIN-TAZOBACTAM 3.375 G IVPB: 3.3750 g | Freq: Three times a day (TID) | INTRAVENOUS | Status: DC

## 2023-01-03 MED ORDER — VANCOMYCIN HCL 1250 MG/250ML IV SOLN: 1250.0000 mg | INTRAVENOUS | Status: DC

## 2023-01-03 NOTE — ED Notes (Signed)
Xray at [facility]

## 2023-01-03 NOTE — ED Triage Notes (Signed)
BIB [facility] from home for 5 days of NV, general weakness, dehydration and some sob. Denies pain, diarrhea, fever, syncope. Only intake over last 5 days has been pedialyte, gatorade, and ensure. Last emesis was 2d ago. Alert, NAD, calm, shaky, flaky, dry. EDP into room during triage process.

## 2023-01-03 NOTE — ED Provider Notes (Signed)
[place] EMERGENCY DEPARTMENT AT [facility] Provider Note   CSN: [id number] Arrival date & time: 01/03/23  1815     History  Chief Complaint  Patient presents with   Weakness    MALIJAH LEVAR is a 77 y.o. male.  77 year old male with past medical history of COPD on home oxygen as well as hypertension and lung cancer in remission presenting to the emergency department today with cough, dyspnea, nausea, vomiting, and loose stools.  The patient states that the symptoms have been going now for the past 2 days.  The patient normally uses a walker at baseline.  He states he has been feeling too weak for this over the past day or 2.  He states that his vomit has been nonbloody and nonbilious.  He states that he is having some soreness in his abdomen that has developed over the past few days.  Reports that he has been trying to drink some fluids but later today he could not so came to the ER for further evaluation.  He denies any known fevers but has had some chills.   Weakness Associated symptoms: cough, diarrhea, nausea and vomiting        Home Medications Prior to Admission medications   Medication Sig Start Date End Date Taking? Authorizing Provider  acetaminophen (TYLENOL) 325 MG tablet Take 650 mg by mouth every 6 (six) hours as needed for mild pain.    [provider]  albuterol (PROVENTIL) (2.5 MG/3ML) 0.083% nebulizer solution Take 3 mLs (2.5 mg total) by nebulization every 6 (six) hours as needed for wheezing or shortness of breath. 03/26/15   Triplett, Tammy, PA-C  ALPRAZolam (XANAX) 0.5 MG tablet Take 1 tablet (0.5 mg total) by mouth at bedtime as needed for anxiety. 10/17/21   Johnson, Clanford L, MD  budesonide (RHINOCORT ALLERGY) 32 MCG/ACT nasal spray Place 1 spray into both nostrils daily.    [provider]  Fluticasone-Umeclidin-Vilant (TRELEGY ELLIPTA) 100-62.5-25 MCG/ACT AEPB Inhale 1 puff into the lungs daily.    [provider]   gabapentin (NEURONTIN) 600 MG tablet Take 1 tablet (600 mg total) by mouth at bedtime. 10/17/21   Johnson, Clanford L, MD  metoprolol (LOPRESSOR) 50 MG tablet TAKE 1 TABLET TWICE A DAY 11/22/12   Daphine Deutscher, Mary-Margaret, FNP  pantoprazole (PROTONIX) 40 MG tablet Take 1 tablet (40 mg total) by mouth daily. 10/18/21   Johnson, Clanford L, MD  polyethylene glycol (MIRALAX / GLYCOLAX) 17 g packet Take 17 g by mouth daily. 10/18/21   Cleora Fleet, MD      Allergies    Patient has no known allergies.    Review of Systems   Review of Systems  Respiratory:  Positive for cough.   Gastrointestinal:  Positive for diarrhea, nausea and vomiting.  Neurological:  Positive for weakness.    Physical Exam Updated Vital Signs BP 138/67   Pulse 93   Temp 97.6 F (36.4 C) (Oral)   Resp 14   Ht 6\' 3"  (1.905 m)   Wt 75.1 kg   SpO2 100%   BMI 20.69 kg/m  Physical Exam Vitals and nursing note reviewed.   Gen: Chronically ill-appearing, pale appearing, speaking full sentences Eyes: PERRL, EOMI HEENT: no oropharyngeal swelling Neck: trachea midline Resp: Diminished at bilateral lung bases with faint scattered wheezes noted Card: RRR, no murmurs, rubs, or gallops Abd: Diffusely tender with no guarding or rebound, nondistended Extremities: no calf tenderness, no edema Vascular: 2+ radial pulses bilaterally, 2+  DP pulses bilaterally Skin: no rashes Psyc: acting appropriately   ED Results / Procedures / Treatments   Labs (all labs ordered are listed, but only abnormal results are displayed) Labs Reviewed  COMPREHENSIVE METABOLIC PANEL - Abnormal; Notable for the following components:      Result Value   Glucose, Bld 124 (*)    BUN 44 (*)    Calcium 7.9 (*)    Total Protein 6.0 (*)    Albumin 2.7 (*)    All other components within normal limits  CBC WITH DIFFERENTIAL/PLATELET - Abnormal; Notable for the following components:   WBC 23.8 (*)    Hemoglobin 12.6 (*)    MCH 25.0 (*)     Neutro Abs 18.8 (*)    Monocytes Absolute 2.3 (*)    Abs Immature Granulocytes 1.19 (*)    All other components within normal limits  LACTIC ACID, PLASMA - Abnormal; Notable for the following components:   Lactic Acid, Venous 2.0 (*)    All other components within normal limits  RESP PANEL BY RT-PCR (RSV, FLU A&B, COVID)  RVPGX2  CULTURE, BLOOD (ROUTINE X 2)  CULTURE, BLOOD (ROUTINE X 2)  LIPASE, BLOOD  LACTIC ACID, PLASMA  PROTIME-INR  APTT  URINALYSIS, W/ REFLEX TO CULTURE (INFECTION SUSPECTED)    EKG EKG Interpretation Date/Time:  Thursday January 03 2023 19:22:22 EDT Ventricular Rate:  107 PR Interval:  137 QRS Duration:  91 QT Interval:  368 QTC Calculation: 491 R Axis:   61  Text Interpretation: Sinus tachycardia LVH with secondary repolarization abnormality Anterior infarct, old Confirmed by Beckey Downing [id number]) on 01/03/2023 7:40:39 PM  Radiology CT ABDOMEN PELVIS W CONTRAST  Result Date: 01/03/2023 CLINICAL DATA:  Acute nonlocalized abdominal pain, nausea, vomiting EXAM: CT ABDOMEN AND PELVIS WITH CONTRAST TECHNIQUE: Multidetector CT imaging of the abdomen and pelvis was performed using the standard protocol following bolus administration of intravenous contrast. RADIATION DOSE REDUCTION: This exam was performed according to the departmental dose-optimization program which includes automated exposure control, adjustment of the mA and/or kV according to patient size and/or use of iterative reconstruction technique. CONTRAST:  OMNIPAQUE IOHEXOL 300 MG/ML  SOLN COMPARISON:  None Available. FINDINGS: Lower chest: Emphysema. Left-sided volume loss. Subpleural nodular opacity is incompletely visualized at the right lung base, axial image # 1, possibly infectious or inflammatory but indeterminate. Cardiac size within normal limits. Small hiatal hernia. Hepatobiliary: Multiple cysts scattered throughout the liver. The liver is otherwise unremarkable. No intra or extrahepatic  biliary ductal dilation. Gallbladder unremarkable Pancreas: Unremarkable Spleen: Unremarkable Adrenals/Urinary Tract: Mild right hydronephrosis and hydroureter to the level of the distal right ureter along the right pelvic sidewall where there is an abrupt caliber change. No extrinsic mass or intraluminal calcifications identified and an underlying stricture is suspected. There is motion artifact that limits evaluation of the lower pole the left kidney. However, there is urothelial enhancement involving the distal left ureter suggesting asymmetric inflammatory change as can be seen with a recently passed calculus or ascending urinary tract infection. No hydronephrosis on the left. No intrarenal or ureteral calculi. No perinephric fluid collections are seen. The bladder is unremarkable Stomach/Bowel: Large volume stool within the rectal vault. Severe descending and sigmoid colonic diverticulosis. Small right inguinal hernia contains a single loop of unremarkable distal small bowel. Stomach, small bowel, and large bowel are otherwise unremarkable. No evidence of obstruction or focal inflammation. No free intraperitoneal gas or fluid. Vascular/Lymphatic: Advanced aortoiliac atherosclerotic calcification. No aortic aneurysm. No pathologic adenopathy within  the abdomen and pelvis. Reproductive: Mild prostatic hypertrophy. Other: Small fat containing left inguinal hernia Musculoskeletal: Left hip ORIF has been performed. Osseous structures are diffusely osteopenic. Advanced degenerative changes are seen within the lumbar spine with ankylosis of L3-L5. IMPRESSION: 1. Mild right hydronephrosis and hydroureter to the level of the distal right ureter along the right pelvic sidewall where there is an abrupt caliber change. No extrinsic mass or intraluminal calcifications identified and an underlying stricture is suspected. Correlation with ureteroscopy may be helpful for further evaluation. 2. Urothelial enhancement involving  the distal left ureter suggesting asymmetric inflammatory change as can be seen with a recently passed calculus or ascending urinary tract infection. Correlation with urinalysis and urine culture may be helpful for further evaluation. 3. Large volume stool within the rectal vault. 4. Severe descending and sigmoid colonic diverticulosis without superimposed acute inflammatory change. 5. Small right inguinal hernia containing a single loop of unremarkable distal small bowel. No evidence of obstruction. Aortic Atherosclerosis (ICD10-I70.0) and Emphysema (ICD10-J43.9). Electronically Signed   By: Helyn Numbers M.D.   On: 01/03/2023 21:29   DG Chest Port 1 View  Result Date: 01/03/2023 CLINICAL DATA:  Sepsis EXAM: PORTABLE CHEST 1 VIEW COMPARISON:  None Available. FINDINGS: Emphysema with left apical bulla formation. Stable left-sided volume loss. Possible retrocardiac atelectasis or infiltrate, though this may be artifactual and related to patient rotation on this examination. No pneumothorax or pleural effusion. Cardiac size within normal limits. No acute bone abnormality. IMPRESSION: 1. Emphysema. 2. Possible retrocardiac atelectasis or infiltrate. A standard two view chest radiograph may be more helpful for further evaluation. Electronically Signed   By: Helyn Numbers M.D.   On: 01/03/2023 21:17    Procedures Procedures    Medications Ordered in ED Medications  lactated ringers infusion ( Intravenous New Bag/Given 01/03/23 2129)  piperacillin-tazobactam (ZOSYN) IVPB 3.375 g (has no administration in time range)  ipratropium-albuterol (DUONEB) 0.5-2.5 (3) MG/3ML nebulizer solution 3 mL (3 mLs Nebulization Given 01/03/23 1959)  lactated ringers bolus 1,000 mL (0 mLs Intravenous Stopped 01/03/23 2128)  ondansetron (ZOFRAN) injection 4 mg (4 mg Intravenous Given 01/03/23 1916)  iohexol (OMNIPAQUE) 300 MG/ML solution 100 mL (100 mLs Intravenous Contrast Given 01/03/23 1943)  piperacillin-tazobactam (ZOSYN)  IVPB 3.375 g (0 g Intravenous Stopped 01/03/23 2029)    ED Course/ Medical Decision Making/ A&P                                 Medical Decision Making 77 year old male with past medical history of COPD and lung cancer in remission presenting to the emergency department today with cough, abdominal discomfort, nausea, vomiting, and diarrhea.  The patient is tachycardic here on arrival.  This may be due to hypovolemia as he does appear dry here on exam.  I give the patient IV fluids.  Will initiate a sepsis workup and obtain a chest x-ray to eval for underlying pneumonia.  Also obtain a COVID and flu swab on the patient.  I will obtain a CT scan of his abdomen to evaluate for intra-abdominal processes such as diverticulitis, colitis, perforated viscus, hepatobiliary pathology, or other intra-abdominal pathology.  Given the constellation of symptoms this may be due to viral etiology.  I will hold off on antibiotics until his initial workup is complete.  I will give him Zofran for nausea.  Will give him a DuoNeb and reevaluate.  The patient's chest x-ray interpreted by me shows no acute  infiltrates, pulmonary edema, or pneumothorax.  The patient did have a significantly elevated white blood cell count.  This was realized at 7:30 PM and IV Zosyn is ordered with his CT scan of his abdomen pending.  Patient CT abdomen showed a questionable ureteral stricture possible passed kidney stone but no other acute findings other than some diverticulosis.  His chest x-ray showed potential retrocardiac opacity.  Vancomycin was added to his antibiotic regimen.  The patient's initial lactate was 1.6 but did go up to 2.  Calls placed to hospitalist service for admission.  Amount and/or Complexity of Data Reviewed Labs: ordered. Radiology: ordered. ECG/medicine tests: ordered.  Risk Prescription drug management. Decision regarding hospitalization.           Final Clinical Impression(s) / ED Diagnoses Final  diagnoses:  Pneumonia due to infectious organism, unspecified laterality, unspecified part of lung  Ureteral stricture  Sepsis, due to unspecified organism, unspecified whether acute organ dysfunction present [facility])    Rx / DC Orders ED Discharge Orders     None         Durwin Glaze, MD 01/03/23 2249

## 2023-01-03 NOTE — Code Documentation (Signed)
CODE SEPSIS - PHARMACY COMMUNICATION  **Broad Spectrum Antibiotics should be administered within 1 hour of Sepsis diagnosis**  Time Code Sepsis Called/Page Received: 1845  Antibiotics Ordered: Zosyn  Time of 1st antibiotic administration: 2001      Sharen Hones ,PharmD Clinical Pharmacist  01/03/2023  8:11 PM

## 2023-01-03 NOTE — ED Notes (Signed)
Patient transported to CT 

## 2023-01-03 NOTE — Consult Note (Signed)
Pharmacy Antibiotic Note  Stephen Fitzpatrick is a 77 y.o. male admitted on 01/03/2023 with  intra-abdominal infection .  Pharmacy has been consulted for Zosyn dosing.  Plan: Zosyn 3.375g IV q8h (4 hour infusion).  Height: 6\' 3"  (190.5 cm) Weight: 75.1 kg (165 lb 9.1 oz) IBW/kg (Calculated) : 84.5  Temp (24hrs), Avg:97.6 F (36.4 C), Min:97.6 F (36.4 C), Max:97.6 F (36.4 C)  Recent Labs  Lab 01/03/23 1839 01/03/23 1845 01/03/23 2043  WBC 23.8*  --   --   CREATININE 1.17  --   --   LATICACIDVEN  --  1.6 2.0*    Estimated Creatinine Clearance: 55.3 mL/min (by C-G formula based on SCr of 1.17 mg/dL).    No Known Allergies  Antimicrobials this admission: 9/26 Zosyn >>    Dose adjustments this admission:   Microbiology results: 9/26 BCx: pending   ADDENDUM: -Pharmacy consulted for vancomycin dosing for pneumonia along with zosyn.  Plan: -Vancomycin 1750mg  IV x1 -Vancomycin 1250 mg IV every 24 hours (AUC 459, Vd 0.72, TBW) -Order MRSA PCR -Follow up LOT, de-escalation, signs of clinical improvement   Arabella Merles, PharmD. Clinical Pharmacist 01/03/2023 10:55 PM

## 2023-01-03 NOTE — Sepsis Progress Note (Signed)
Elink monitoring for the code sepsis protocol.  

## 2023-01-03 NOTE — Consult Note (Signed)
Pharmacy Antibiotic Note  Stephen Fitzpatrick is a 77 y.o. male admitted on 01/03/2023 with  intra-abdominal infection .  Pharmacy has been consulted for Zosyn dosing.  Plan: Zosyn 3.375g IV q8h (4 hour infusion).  Height: 6\' 3"  (190.5 cm) Weight: 75.1 kg (165 lb 9.1 oz) IBW/kg (Calculated) : 84.5  Temp (24hrs), Avg:97.6 F (36.4 C), Min:97.6 F (36.4 C), Max:97.6 F (36.4 C)  Recent Labs  Lab 01/03/23 1839 01/03/23 1845  WBC 23.8*  --   CREATININE 1.17  --   LATICACIDVEN  --  1.6    Estimated Creatinine Clearance: 55.3 mL/min (by C-G formula based on SCr of 1.17 mg/dL).    No Known Allergies  Antimicrobials this admission: 9/26 Zosyn >>    Dose adjustments this admission:   Microbiology results: 9/26 BCx: pending   Thank you for allowing pharmacy to be a part of this patient's care.  Sharen Hones, PharmD, BCPS Clinical Pharmacist   01/03/2023 8:00 PM

## 2023-01-04 ENCOUNTER — Inpatient Hospital Stay (HOSPITAL_COMMUNITY): Payer: Medicare HMO

## 2023-01-04 DIAGNOSIS — I10 Essential (primary) hypertension: Secondary | ICD-10-CM

## 2023-01-04 DIAGNOSIS — J189 Pneumonia, unspecified organism: Secondary | ICD-10-CM | POA: Diagnosis not present

## 2023-01-04 DIAGNOSIS — J449 Chronic obstructive pulmonary disease, unspecified: Secondary | ICD-10-CM | POA: Diagnosis not present

## 2023-01-04 DIAGNOSIS — R6510 Systemic inflammatory response syndrome (SIRS) of non-infectious origin without acute organ dysfunction: Secondary | ICD-10-CM | POA: Diagnosis not present

## 2023-01-04 DIAGNOSIS — J9611 Chronic respiratory failure with hypoxia: Secondary | ICD-10-CM | POA: Diagnosis not present

## 2023-01-04 DIAGNOSIS — K219 Gastro-esophageal reflux disease without esophagitis: Secondary | ICD-10-CM | POA: Insufficient documentation

## 2023-01-04 DIAGNOSIS — N1330 Unspecified hydronephrosis: Secondary | ICD-10-CM | POA: Insufficient documentation

## 2023-01-04 DIAGNOSIS — E43 Unspecified severe protein-calorie malnutrition: Secondary | ICD-10-CM | POA: Insufficient documentation

## 2023-01-04 DIAGNOSIS — N131 Hydronephrosis with ureteral stricture, not elsewhere classified: Secondary | ICD-10-CM

## 2023-01-04 LAB — STREP PNEUMONIAE URINARY ANTIGEN: Strep Pneumo Urinary Antigen: NEGATIVE

## 2023-01-04 LAB — COMPREHENSIVE METABOLIC PANEL
ALT: 12 U/L (ref 0–44)
AST: 15 U/L (ref 15–41)
Albumin: 2.2 g/dL — ABNORMAL LOW (ref 3.5–5.0)
Alkaline Phosphatase: 59 U/L (ref 38–126)
Anion gap: 11 (ref 5–15)
BUN: 37 mg/dL — ABNORMAL HIGH (ref 8–23)
CO2: 26 mmol/L (ref 22–32)
Calcium: 7.5 mg/dL — ABNORMAL LOW (ref 8.9–10.3)
Chloride: 100 mmol/L (ref 98–111)
Creatinine, Ser: 1.11 mg/dL (ref 0.61–1.24)
GFR, Estimated: >60 mL/min (ref >60)
Glucose, Bld: 102 mg/dL — ABNORMAL HIGH (ref 70–99)
Potassium: 3.6 mmol/L (ref 3.5–5.1)
Sodium: 137 mmol/L (ref 135–145)
Total Bilirubin: 0.6 mg/dL (ref 0.3–1.2)
Total Protein: 5 g/dL — ABNORMAL LOW (ref 6.5–8.1)

## 2023-01-04 LAB — CBC WITH DIFFERENTIAL/PLATELET
Abs Immature Granulocytes: 0.95 10*3/uL — ABNORMAL HIGH (ref 0.00–0.07)
Basophils Absolute: 0.1 10*3/uL (ref 0.0–0.1)
Basophils Relative: 0 %
Eosinophils Absolute: 0.4 10*3/uL (ref 0.0–0.5)
Eosinophils Relative: 1 %
HCT: 34.2 % — ABNORMAL LOW (ref 39.0–52.0)
Hemoglobin: 10.5 g/dL — ABNORMAL LOW (ref 13.0–17.0)
Immature Granulocytes: 4 %
Lymphocytes Relative: 3 %
Lymphs Abs: 0.8 10*3/uL (ref 0.7–4.0)
MCH: 24.8 pg — ABNORMAL LOW (ref 26.0–34.0)
MCHC: 30.7 g/dL (ref 30.0–36.0)
MCV: 80.9 fL (ref 80.0–100.0)
Monocytes Absolute: 2.4 10*3/uL — ABNORMAL HIGH (ref 0.1–1.0)
Monocytes Relative: 9 %
Neutro Abs: 22.9 10*3/uL — ABNORMAL HIGH (ref 1.7–7.7)
Neutrophils Relative %: 83 %
Platelets: 209 10*3/uL (ref 150–400)
RBC: 4.23 MIL/uL (ref 4.22–5.81)
RDW: 15.5 % (ref 11.5–15.5)
WBC: 27.5 10*3/uL — ABNORMAL HIGH (ref 4.0–10.5)
nRBC: 0 % (ref 0.0–0.2)

## 2023-01-04 LAB — URINALYSIS, W/ REFLEX TO CULTURE (INFECTION SUSPECTED)
Bilirubin Urine: NEGATIVE
Glucose, UA: NEGATIVE mg/dL
Ketones, ur: NEGATIVE mg/dL
Nitrite: NEGATIVE
Protein, ur: 30 mg/dL — AB
RBC / HPF: >50 RBC/hpf (ref 0–5)
Specific Gravity, Urine: 1.043 — ABNORMAL HIGH (ref 1.005–1.030)
pH: 7 (ref 5.0–8.0)

## 2023-01-04 LAB — MAGNESIUM: Magnesium: 2.3 mg/dL (ref 1.7–2.4)

## 2023-01-04 LAB — TROPONIN I (HIGH SENSITIVITY): Troponin I (High Sensitivity): 4 ng/L (ref <18)

## 2023-01-04 LAB — LACTIC ACID, PLASMA: Lactic Acid, Venous: 1.3 mmol/L (ref 0.5–1.9)

## 2023-01-04 MED ORDER — OXYCODONE HCL 5 MG PO TABS
5.0000 mg | ORAL_TABLET | ORAL | Status: DC | PRN
  Administered 2023-01-04 – 2023-01-08 (×6): 5 mg via ORAL
  Filled 2023-01-04 (×6): qty 1

## 2023-01-04 MED ORDER — FLUTICASONE FUROATE-VILANTEROL 100-25 MCG/ACT IN AEPB
1.0000 | INHALATION_SPRAY | Freq: Every day | RESPIRATORY_TRACT | Status: DC
  Administered 2023-01-04 – 2023-01-09 (×6): 1 via RESPIRATORY_TRACT
  Filled 2023-01-04: qty 28

## 2023-01-04 MED ORDER — ADULT MULTIVITAMIN W/MINERALS CH
1.0000 | ORAL_TABLET | Freq: Every day | ORAL | Status: DC
  Administered 2023-01-04 – 2023-01-09 (×6): 1 via ORAL
  Filled 2023-01-04 (×6): qty 1

## 2023-01-04 MED ORDER — UMECLIDINIUM BROMIDE 62.5 MCG/ACT IN AEPB
1.0000 | INHALATION_SPRAY | Freq: Every day | RESPIRATORY_TRACT | Status: DC
  Administered 2023-01-04 – 2023-01-09 (×6): 1 via RESPIRATORY_TRACT
  Filled 2023-01-04: qty 7

## 2023-01-04 MED ORDER — ENSURE ENLIVE PO LIQD
237.0000 mL | Freq: Two times a day (BID) | ORAL | Status: DC
  Administered 2023-01-04: 237 mL via ORAL

## 2023-01-04 MED ORDER — ONDANSETRON HCL 4 MG PO TABS
4.0000 mg | ORAL_TABLET | Freq: Four times a day (QID) | ORAL | Status: DC | PRN
  Administered 2023-01-05 – 2023-01-09 (×2): 4 mg via ORAL
  Filled 2023-01-04 (×2): qty 1

## 2023-01-04 MED ORDER — HEPARIN SODIUM (PORCINE) 5000 UNIT/ML IJ SOLN
5000.0000 [IU] | Freq: Three times a day (TID) | INTRAMUSCULAR | Status: DC
  Administered 2023-01-04 – 2023-01-09 (×16): 5000 [IU] via SUBCUTANEOUS
  Filled 2023-01-04 (×16): qty 1

## 2023-01-04 MED ORDER — ACETAMINOPHEN 650 MG RE SUPP: 650.0000 mg | Freq: Four times a day (QID) | RECTAL | Status: DC | PRN

## 2023-01-04 MED ORDER — ALBUTEROL SULFATE (2.5 MG/3ML) 0.083% IN NEBU
2.5000 mg | INHALATION_SOLUTION | Freq: Four times a day (QID) | RESPIRATORY_TRACT | Status: DC
  Administered 2023-01-04 – 2023-01-07 (×15): 2.5 mg via RESPIRATORY_TRACT
  Filled 2023-01-04 (×15): qty 3

## 2023-01-04 MED ORDER — ALPRAZOLAM 0.5 MG PO TABS
0.5000 mg | ORAL_TABLET | Freq: Every evening | ORAL | Status: DC | PRN
  Administered 2023-01-04 – 2023-01-08 (×3): 0.5 mg via ORAL
  Filled 2023-01-04 (×3): qty 1

## 2023-01-04 MED ORDER — GABAPENTIN 600 MG PO TABS: 600.0000 mg | ORAL_TABLET | Freq: Every day | ORAL | Status: DC

## 2023-01-04 MED ORDER — ENSURE ENLIVE PO LIQD
237.0000 mL | Freq: Three times a day (TID) | ORAL | Status: DC
  Administered 2023-01-04 – 2023-01-09 (×17): 237 mL via ORAL

## 2023-01-04 MED ORDER — PANTOPRAZOLE SODIUM 40 MG PO TBEC
40.0000 mg | DELAYED_RELEASE_TABLET | Freq: Every day | ORAL | Status: DC
  Administered 2023-01-04 – 2023-01-09 (×6): 40 mg via ORAL
  Filled 2023-01-04 (×6): qty 1

## 2023-01-04 MED ORDER — ACETAMINOPHEN 325 MG PO TABS
650.0000 mg | ORAL_TABLET | Freq: Four times a day (QID) | ORAL | Status: DC | PRN
  Administered 2023-01-06: 650 mg via ORAL
  Filled 2023-01-04: qty 2

## 2023-01-04 MED ORDER — METOPROLOL TARTRATE 50 MG PO TABS
50.0000 mg | ORAL_TABLET | Freq: Two times a day (BID) | ORAL | Status: DC
  Administered 2023-01-04 – 2023-01-09 (×12): 50 mg via ORAL
  Filled 2023-01-04 (×12): qty 1

## 2023-01-04 MED ORDER — ONDANSETRON HCL 4 MG/2ML IJ SOLN
4.0000 mg | Freq: Four times a day (QID) | INTRAMUSCULAR | Status: DC | PRN
  Administered 2023-01-04 – 2023-01-09 (×8): 4 mg via INTRAVENOUS
  Filled 2023-01-04 (×8): qty 2

## 2023-01-04 MED ORDER — CYCLOBENZAPRINE HCL 10 MG PO TABS
5.0000 mg | ORAL_TABLET | Freq: Once | ORAL | Status: AC
  Administered 2023-01-04: 5 mg via ORAL
  Filled 2023-01-04: qty 1

## 2023-01-04 NOTE — Progress Notes (Signed)
   01/04/23 1115  TOC Brief Assessment  Insurance and Status Reviewed  Home environment has been reviewed Home  Prior level of function: Independent  Prior/Current Home Services No current home services  Social Determinants of Health Reivew SDOH reviewed no interventions necessary  Readmission risk has been reviewed Yes  Transition of care needs transition of care needs identified, TOC will continue to follow    Transition of Care Department (TOC) has reviewed patient and no TOC needs have been identified at this time. We will continue to monitor patient advancement through interdisciplinary progression rounds. If new patient transition needs arise, please place a TOC consult.

## 2023-01-04 NOTE — Plan of Care (Signed)

## 2023-01-04 NOTE — Assessment & Plan Note (Signed)
-   Reports using 4-5 L nasal cannula at home - Currently on 4 L nasal cannula - Chronic respiratory failure secondary to COPD - See COPD plan

## 2023-01-04 NOTE — Assessment & Plan Note (Signed)
Continue metoprolol. 

## 2023-01-04 NOTE — Assessment & Plan Note (Signed)
-   Retrocardiac culture - Continue broad-spectrum antibiotics - Sputum culture - Strep and Legionella urine antigens - Continue to monitor

## 2023-01-04 NOTE — Assessment & Plan Note (Signed)
-   Tachycardia at 124, leukocytosis at 23.8 - Negative COVID - Blood cultures pending - Urine culture pending - CT abdomen pelvis shows mild hydronephrosis and hydroureter on the right with possible underlying stricture, recommended urine culture due to changes that could be consistent with UTI - Chest x-ray shows possible retrocardiac infiltrate -Patient initially started on broad-spectrum antibiotics, vancomycin and Zosyn - Continue broad-spectrum antibiotics for now - Patient feeling more short of breath than at presentation, hold fluids recheck chest x-ray - Lactic acid 1.6>> 2.0, trend again in the a.m. - Continue to monitor

## 2023-01-04 NOTE — Assessment & Plan Note (Signed)
-   With changes consistent with UTI - Patient reports dysuria for 4-5 days - Continue broad-spectrum antibiotics - Urine culture pending - Blood culture pending - Consult urology - Continue to monitor

## 2023-01-04 NOTE — Assessment & Plan Note (Signed)
-   DuoNeb given in the ED - Continue Trelegy and as needed albuterol

## 2023-01-04 NOTE — Progress Notes (Signed)
Initial Nutrition Assessment  DOCUMENTATION CODES:   Severe malnutrition in context of chronic illness, Underweight  INTERVENTION:   Ensure Plus High Protein po QID, each supplement provides 350 kcal and 20 grams of protein. MVI with minerals daily. Magic cup TID with meals, each supplement provides 290 kcal and 9 grams of protein  NUTRITION DIAGNOSIS:   Severe Malnutrition related to chronic illness (COPD, CHF, lung cancer) as evidenced by severe muscle depletion, severe fat depletion.  GOAL:   Patient will meet greater than or equal to 90% of their needs  MONITOR:   PO intake, Supplement acceptance  REASON FOR ASSESSMENT:   Malnutrition Screening Tool    ASSESSMENT:   77 yo male admitted with SIRS/sepsis, PNA. PMH includes lung cancer, CHF, COPD, GERD, HTN.  Patient reports that he is eating well. He ate 30% of breakfast today per nursing documentation. He likes Ensure supplements and drinks 4-5 bottles per day at home. He has lost a lot of weight over the past few years; reports weighing 173 lbs one year ago. Current weight 146.5 lbs.   Labs reviewed.  Medications reviewed and include Protonix, IV antibiotics.  Weight history reviewed. Patient with 11% weight loss over the past 14 months. He meets criteria for severe malnutrition, given severe depletion of muscle and subcutaneous fat mass.   NUTRITION - FOCUSED PHYSICAL EXAM:  Flowsheet Row Most Recent Value  Orbital Region Severe depletion  Upper Arm Region Severe depletion  Thoracic and Lumbar Region Severe depletion  Buccal Region Severe depletion  Temple Region Severe depletion  Clavicle Bone Region Severe depletion  Clavicle and Acromion Bone Region Severe depletion  Scapular Bone Region Severe depletion  Dorsal Hand Severe depletion  Patellar Region Severe depletion  Anterior Thigh Region Severe depletion  Posterior Calf Region Severe depletion  Edema (RD Assessment) None  Hair Reviewed  Eyes Reviewed   Mouth Reviewed  Skin Reviewed  Nails Reviewed       Diet Order:   Diet Order             Diet Heart Room service appropriate? Yes; Fluid consistency: Thin  Diet effective now                   EDUCATION NEEDS:   Education needs have been addressed  Skin:  Skin Assessment: Skin Integrity Issues: Skin Integrity Issues:: Stage II Stage II: bilateral ears d/t oxygen tubing  Last BM:  9/27  Height:   Ht Readings from Last 1 Encounters:  01/03/23 6\' 3"  (1.905 m)    Weight:   Wt Readings from Last 1 Encounters:  01/04/23 66.6 kg    Ideal Body Weight:  89.1 kg  BMI:  Body mass index is 18.35 kg/m.  Estimated Nutritional Needs:   Kcal:  2000-2200  Protein:  100-120 gm  Fluid:  2-2.2 L   Gabriel Rainwater RD, LDN, CNSC Please refer to Amion for contact information.

## 2023-01-04 NOTE — Assessment & Plan Note (Signed)
- 

## 2023-01-04 NOTE — H&P (Signed)
History and Physical    Patient: Stephen Fitzpatrick UUV:[id number] DOB: 1945/10/09 DOA: 01/03/2023 DOS: the patient was seen and examined on 01/04/2023 PCP: Rebekah Chesterfield, NP  Patient coming from: Home  Chief Complaint:  Chief Complaint  Patient presents with   Weakness   HPI: Stephen Fitzpatrick is a 77 y.o. male with medical history significant of carcinoma of the lung, CHF, COPD, GERD, anxiety, and more presents the ED with a chief complaint of dyspnea and hiccups that prevent him from sleeping.  Patient reports that he has had dyspnea and hiccups for 3-4 days.  His dyspnea is worse when laying flat.  He has a very weak cough but he reports that it is productive of white sputum.  He has had no hemoptysis.  Patient denies chest pain and palpitations.  He does report nausea that usually related to his cough.  He has not had any fevers.  Patient wears 4-5 L nasal cannula at home.  He denies any swelling in his legs.  He reports that he had decreased appetite for 4-5 days.  Patient also complains of dysuria for 4-5 days.  He reports it burns like somebody is taken 4 or 5 sticks of matches to him.  He has had no history of UTI in the past per his report.  Lastly on review of systems he complains of diarrhea.  He has been having diarrhea 2-3 times a day.  He denies any melena or hematochezia.  Patient has no other complaints at this time.  Patient does not smoke and does not drink.  He is full code. Review of Systems: As mentioned in the history of present illness. All other systems reviewed and are negative. Past Medical History:  Diagnosis Date   Adenocarcinoma of lung (HCC)    CHF (congestive heart failure) (HCC)    COPD (chronic obstructive pulmonary disease) (HCC)    Mass of lung    Nodule of left lung    PONV (postoperative nausea and vomiting)    Past Surgical History:  Procedure Laterality Date   BACK SURGERY     HIP PINNING,CANNULATED Left 10/13/2021   Procedure: CANNULATED HIP  PINNING;  Surgeon: Oliver Barre, MD;  Location: [facility];  Service: Orthopedics;  Laterality: Left;   Left video-assisted thoracic surgery, resection  08/24/10   Dr Edwyna Shell   Social History:  reports that he quit smoking about 12 years ago. His smoking use included cigarettes. He has never used smokeless tobacco. He reports that he does not drink alcohol and does not use drugs.  No Known Allergies  History reviewed. No pertinent family history.  Prior to Admission medications   Medication Sig Start Date End Date Taking? Authorizing Provider  acetaminophen (TYLENOL) 325 MG tablet Take 650 mg by mouth every 6 (six) hours as needed for mild pain.    [provider]  albuterol (PROVENTIL) (2.5 MG/3ML) 0.083% nebulizer solution Take 3 mLs (2.5 mg total) by nebulization every 6 (six) hours as needed for wheezing or shortness of breath. 03/26/15   Triplett, Tammy, PA-C  ALPRAZolam (XANAX) 0.5 MG tablet Take 1 tablet (0.5 mg total) by mouth at bedtime as needed for anxiety. 10/17/21   Johnson, Clanford L, MD  budesonide (RHINOCORT ALLERGY) 32 MCG/ACT nasal spray Place 1 spray into both nostrils daily.    [provider]  Fluticasone-Umeclidin-Vilant (TRELEGY ELLIPTA) 100-62.5-25 MCG/ACT AEPB Inhale 1 puff into the lungs daily.    [provider]  gabapentin (NEURONTIN) 600 MG  tablet Take 1 tablet (600 mg total) by mouth at bedtime. 10/17/21   Johnson, Clanford L, MD  metoprolol (LOPRESSOR) 50 MG tablet TAKE 1 TABLET TWICE A DAY 11/22/12   Daphine Deutscher, Mary-Margaret, FNP  pantoprazole (PROTONIX) 40 MG tablet Take 1 tablet (40 mg total) by mouth daily. 10/18/21   Johnson, Clanford L, MD  polyethylene glycol (MIRALAX / GLYCOLAX) 17 g packet Take 17 g by mouth daily. 10/18/21   Cleora Fleet, MD    Physical Exam: Vitals:   01/03/23 2314 01/04/23 0000 01/04/23 0030 01/04/23 0116  BP: (!) 146/64 137/67 (!) 153/67 131/79  Pulse: 93 88  96  Resp: 16 17 (!) 21 18  Temp:    98.1 F  (36.7 C)  TempSrc:      SpO2: 99% 99% 99% 94%  Weight:    66.6 kg  Height:       1.  General: Patient lying supine in bed,  no acute distress   2. Psychiatric: Alert and oriented x 3, mood and behavior normal for situation, pleasant and cooperative with exam   3. Neurologic: Speech and language are normal, face is symmetric, moves all 4 extremities voluntarily, at baseline without acute deficits on limited exam   4. HEENMT:  Head is atraumatic, normocephalic, pupils reactive to light, neck is supple, trachea is midline, mucous membranes are moist   5. Respiratory : Lungs are clear to auscultation bilaterally without wheezing, rhonchi, rales, no cyanosis, no increase in work of breathing or accessory muscle use   6. Cardiovascular : Heart rate mildly tachycardic, rhythm is regular, no murmurs, rubs or gallops, no peripheral edema, peripheral pulses palpated   7. Gastrointestinal:  Abdomen is soft, nondistended, nontender to palpation bowel sounds active, no masses or organomegaly palpated   8. Skin:  Skin is warm, dry and intact without rashes, acute lesions, or ulcers on limited exam   9.Musculoskeletal:  No acute deformities or trauma, no asymmetry in tone, no peripheral edema, peripheral pulses palpated, no tenderness to palpation in the extremities   Data Reviewed: In the ED Temp 97.6-98.1, heart rate 93-124, respiratory rate 14-20, blood pressure 132/67-149/107, satting 96-100% Leukocytosis at 23.8, hemoglobin 12.6, platelets 229 Chemistries unremarkable aside from a decreased albumin at 2.7 Lactic acid initially normal at 1.6 but then up to 2.0 Negative COVID Blood culture pending CT abdomen pelvis shows mild hydronephrosis and hydroureter on the right with underlying stricture and signs of UTI recommending urine culture DuoNeb, 1 L LR, Zosyn, vancomycin given in the ED LR continued at 150 mL/h at admission, but then on exam patient seems short of breath and says  that he feels more short of breath than when he came in so holding fluids until repeat chest x-ray is done Admission requested for SIRS/sepsis given the second lactic acid Assessment and Plan: * SIRS (systemic inflammatory response syndrome) (HCC) - Tachycardia at 124, leukocytosis at 23.8 - Negative COVID - Blood cultures pending - Urine culture pending - CT abdomen pelvis shows mild hydronephrosis and hydroureter on the right with possible underlying stricture, recommended urine culture due to changes that could be consistent with UTI - Chest x-ray shows possible retrocardiac infiltrate -Patient initially started on broad-spectrum antibiotics, vancomycin and Zosyn - Continue broad-spectrum antibiotics for now - Patient feeling more short of breath than at presentation, hold fluids recheck chest x-ray - Lactic acid 1.6>> 2.0, trend again in the a.m. - Continue to monitor  PNA (pneumonia) - Retrocardiac culture - Continue broad-spectrum antibiotics -  Sputum culture - Strep and Legionella urine antigens - Continue to monitor  GERD (gastroesophageal reflux disease) - Continue Protonix  Hydronephrosis - With changes consistent with UTI - Patient reports dysuria for 4-5 days - Continue broad-spectrum antibiotics - Urine culture pending - Blood culture pending - Consult urology - Continue to monitor  HTN (hypertension) - Continue metoprolol  Chronic respiratory failure with hypoxia (HCC) - Reports using 4-5 L nasal cannula at home - Currently on 4 L nasal cannula - Chronic respiratory failure secondary to COPD - See COPD plan  COPD (chronic obstructive pulmonary disease) (HCC) - DuoNeb given in the ED - Continue Trelegy and as needed albuterol      Advance Care Planning:   Code Status: Full Code  Consults: Urology  Family Communication: No family at bedside  Severity of Illness: The appropriate patient status for this patient is INPATIENT. Inpatient status is  judged to be reasonable and necessary in order to provide the required intensity of service to ensure the patient's safety. The patient's presenting symptoms, physical exam findings, and initial radiographic and laboratory data in the context of their chronic comorbidities is felt to place them at high risk for further clinical deterioration. Furthermore, it is not anticipated that the patient will be medically stable for discharge from the hospital within 2 midnights of admission.   * I certify that at the point of admission it is my clinical judgment that the patient will require inpatient hospital care spanning beyond 2 midnights from the point of admission due to high intensity of service, high risk for further deterioration and high frequency of surveillance required.*  Author: Lilyan Gilford, DO 01/04/2023 3:18 AM  For on call review [email or web address].

## 2023-01-04 NOTE — Progress Notes (Signed)
ASSUMPTION OF CARE NOTE   01/04/2023 3:54 PM  Stephen Fitzpatrick was seen and examined.  The H&P by the admitting provider, orders, imaging was reviewed.  Please see new orders.  Will continue to follow.   Vitals:   01/04/23 1424 01/04/23 1453  BP:  134/64  Pulse:  78  Resp:  18  Temp:  98.2 F (36.8 C)  SpO2: 97% 100%    Results for orders placed or performed during the hospital encounter of 01/03/23  Resp panel by RT-PCR (RSV, Flu A&B, Covid) Anterior Nasal Swab   Specimen: Anterior Nasal Swab  Result Value Ref Range   SARS Coronavirus 2 by RT PCR NEGATIVE NEGATIVE   Influenza A by PCR NEGATIVE NEGATIVE   Influenza B by PCR NEGATIVE NEGATIVE   Resp Syncytial Virus by PCR NEGATIVE NEGATIVE  Blood Culture (routine x 2)   Specimen: Right Antecubital; Blood  Result Value Ref Range   Specimen Description RIGHT ANTECUBITAL    Special Requests      BOTTLES DRAWN AEROBIC AND ANAEROBIC Blood Culture results may not be optimal due to an inadequate volume of blood received in culture bottles   Culture      NO GROWTH < 12 HOURS Performed at [facility], [address], [place], [place] [address]    Report Status PENDING   Blood Culture (routine x 2)   Specimen: Right Antecubital; Blood  Result Value Ref Range   Specimen Description RIGHT ANTECUBITAL    Special Requests      BOTTLES DRAWN AEROBIC AND ANAEROBIC Blood Culture results may not be optimal due to an excessive volume of blood received in culture bottles   Culture      NO GROWTH < 12 HOURS Performed at [facility], [address], [place], [place] [address]    Report Status PENDING   Lipase, blood  Result Value Ref Range   Lipase 43 11 - 51 U/L  Comprehensive metabolic panel  Result Value Ref Range   Sodium 138 135 - 145 mmol/L   Potassium 3.9 3.5 - 5.1 mmol/L   Chloride 99 98 - 111 mmol/L   CO2 29 22 - 32 mmol/L   Glucose, Bld 124 (H) 70 - 99 mg/dL   BUN 44 (H) 8 - 23 mg/dL   Creatinine, Ser 4.69 0.61 - 1.24 mg/dL    Calcium 7.9 (L) 8.9 - 10.3 mg/dL   Total Protein 6.0 (L) 6.5 - 8.1 g/dL   Albumin 2.7 (L) 3.5 - 5.0 g/dL   AST 17 15 - 41 U/L   ALT 13 0 - 44 U/L   Alkaline Phosphatase 69 38 - 126 U/L   Total Bilirubin 0.7 0.3 - 1.2 mg/dL   GFR, Estimated >62 >95 mL/min   Anion gap 10 5 - 15  CBC with Differential  Result Value Ref Range   WBC 23.8 (H) 4.0 - 10.5 K/uL   RBC 5.04 4.22 - 5.81 MIL/uL   Hemoglobin 12.6 (L) 13.0 - 17.0 g/dL   HCT 28.4 13.2 - 44.0 %   MCV 80.8 80.0 - 100.0 fL   MCH 25.0 (L) 26.0 - 34.0 pg   MCHC 31.0 30.0 - 36.0 g/dL   RDW 10.2 72.5 - 36.6 %   Platelets 229 150 - 400 K/uL   nRBC 0.0 0.0 - 0.2 %   Neutrophils Relative % 78 %   Neutro Abs 18.8 (H) 1.7 - 7.7 K/uL   Lymphocytes Relative 4 %   Lymphs Abs 1.0  0.7 - 4.0 K/uL   Monocytes Relative 10 %   Monocytes Absolute 2.3 (H) 0.1 - 1.0 K/uL   Eosinophils Relative 2 %   Eosinophils Absolute 0.4 0.0 - 0.5 K/uL   Basophils Relative 1 %   Basophils Absolute 0.1 0.0 - 0.1 K/uL   Immature Granulocytes 5 %   Abs Immature Granulocytes 1.19 (H) 0.00 - 0.07 K/uL  Lactic acid, plasma  Result Value Ref Range   Lactic Acid, Venous 1.6 0.5 - 1.9 mmol/L  Lactic acid, plasma  Result Value Ref Range   Lactic Acid, Venous 2.0 (HH) 0.5 - 1.9 mmol/L  Urinalysis, w/ Reflex to Culture (Infection Suspected) -Urine, Clean Catch  Result Value Ref Range   Specimen Source URINE, CLEAN CATCH    Color, Urine YELLOW YELLOW   APPearance HAZY (A) CLEAR   Specific Gravity, Urine 1.043 (H) 1.005 - 1.030   pH 7.0 5.0 - 8.0   Glucose, UA NEGATIVE NEGATIVE mg/dL   Hgb urine dipstick LARGE (A) NEGATIVE   Bilirubin Urine NEGATIVE NEGATIVE   Ketones, ur NEGATIVE NEGATIVE mg/dL   Protein, ur 30 (A) NEGATIVE mg/dL   Nitrite NEGATIVE NEGATIVE   Leukocytes,Ua LARGE (A) NEGATIVE   RBC / HPF >50 0 - 5 RBC/hpf   WBC, UA 21-50 0 - 5 WBC/hpf   Bacteria, UA FEW (A) NONE SEEN   Squamous Epithelial / HPF 0-5 0 - 5 /HPF   Mucus PRESENT   Protime-INR   Result Value Ref Range   Prothrombin Time 15.2 11.4 - 15.2 seconds   INR 1.2 0.8 - 1.2  APTT  Result Value Ref Range   aPTT 28 24 - 36 seconds  Strep pneumoniae urinary antigen  Result Value Ref Range   Strep Pneumo Urinary Antigen NEGATIVE NEGATIVE  Comprehensive metabolic panel  Result Value Ref Range   Sodium 137 135 - 145 mmol/L   Potassium 3.6 3.5 - 5.1 mmol/L   Chloride 100 98 - 111 mmol/L   CO2 26 22 - 32 mmol/L   Glucose, Bld 102 (H) 70 - 99 mg/dL   BUN 37 (H) 8 - 23 mg/dL   Creatinine, Ser 1.91 0.61 - 1.24 mg/dL   Calcium 7.5 (L) 8.9 - 10.3 mg/dL   Total Protein 5.0 (L) 6.5 - 8.1 g/dL   Albumin 2.2 (L) 3.5 - 5.0 g/dL   AST 15 15 - 41 U/L   ALT 12 0 - 44 U/L   Alkaline Phosphatase 59 38 - 126 U/L   Total Bilirubin 0.6 0.3 - 1.2 mg/dL   GFR, Estimated >47 >82 mL/min   Anion gap 11 5 - 15  Magnesium  Result Value Ref Range   Magnesium 2.3 1.7 - 2.4 mg/dL  CBC with Differential/Platelet  Result Value Ref Range   WBC 27.5 (H) 4.0 - 10.5 K/uL   RBC 4.23 4.22 - 5.81 MIL/uL   Hemoglobin 10.5 (L) 13.0 - 17.0 g/dL   HCT 95.6 (L) 21.3 - 08.6 %   MCV 80.9 80.0 - 100.0 fL   MCH 24.8 (L) 26.0 - 34.0 pg   MCHC 30.7 30.0 - 36.0 g/dL   RDW 57.8 46.9 - 62.9 %   Platelets 209 150 - 400 K/uL   nRBC 0.0 0.0 - 0.2 %   Neutrophils Relative % 83 %   Neutro Abs 22.9 (H) 1.7 - 7.7 K/uL   Lymphocytes Relative 3 %   Lymphs Abs 0.8 0.7 - 4.0 K/uL   Monocytes Relative 9 %  Monocytes Absolute 2.4 (H) 0.1 - 1.0 K/uL   Eosinophils Relative 1 %   Eosinophils Absolute 0.4 0.0 - 0.5 K/uL   Basophils Relative 0 %   Basophils Absolute 0.1 0.0 - 0.1 K/uL   WBC Morphology WHITE CELLS CONFIRMED BY SMEAR    RBC Morphology MORPHOLOGY UNREMARKABLE    Smear Review MORPHOLOGY UNREMARKABLE    Immature Granulocytes 4 %   Abs Immature Granulocytes 0.95 (H) 0.00 - 0.07 K/uL  Lactic acid, plasma  Result Value Ref Range   Lactic Acid, Venous 1.3 0.5 - 1.9 mmol/L  Troponin I (High Sensitivity)   Result Value Ref Range   Troponin I (High Sensitivity) 4 <18 ng/L   C. Laural Benes, MD [facility]   01/03/2023  6:30 PM How to contact the [facility] Attending or Consulting provider 7A - 7P or covering provider during after hours 7P -7A, for this patient?  Check the care team in [facility] and look for a) attending/consulting [facility] provider listed and b) the [facility] team listed Log into [email or web address] and use [facility]'s universal password to access. If you do not have the password, please contact the hospital operator. Locate the [facility] provider you are looking for under [facility] and page to a number that you can be directly reached. If you still have difficulty reaching the provider, please page the [facility] (Director on Call) for the Hospitalists listed on amion for assistance.

## 2023-01-05 DIAGNOSIS — R6510 Systemic inflammatory response syndrome (SIRS) of non-infectious origin without acute organ dysfunction: Secondary | ICD-10-CM | POA: Diagnosis not present

## 2023-01-05 LAB — CBC WITH DIFFERENTIAL/PLATELET
Abs Immature Granulocytes: 0.53 10*3/uL — ABNORMAL HIGH (ref 0.00–0.07)
Basophils Absolute: 0.1 10*3/uL (ref 0.0–0.1)
Basophils Relative: 0 %
Eosinophils Absolute: 0.6 10*3/uL — ABNORMAL HIGH (ref 0.0–0.5)
Eosinophils Relative: 3 %
HCT: 32.3 % — ABNORMAL LOW (ref 39.0–52.0)
Hemoglobin: 9.8 g/dL — ABNORMAL LOW (ref 13.0–17.0)
Immature Granulocytes: 3 %
Lymphocytes Relative: 4 %
Lymphs Abs: 0.9 10*3/uL (ref 0.7–4.0)
MCH: 24.9 pg — ABNORMAL LOW (ref 26.0–34.0)
MCHC: 30.3 g/dL (ref 30.0–36.0)
MCV: 82.2 fL (ref 80.0–100.0)
Monocytes Absolute: 1.4 10*3/uL — ABNORMAL HIGH (ref 0.1–1.0)
Monocytes Relative: 7 %
Neutro Abs: 17.5 10*3/uL — ABNORMAL HIGH (ref 1.7–7.7)
Neutrophils Relative %: 83 %
Platelets: 224 10*3/uL (ref 150–400)
RBC: 3.93 MIL/uL — ABNORMAL LOW (ref 4.22–5.81)
RDW: 15.4 % (ref 11.5–15.5)
WBC: 20.9 10*3/uL — ABNORMAL HIGH (ref 4.0–10.5)
nRBC: 0 % (ref 0.0–0.2)

## 2023-01-05 LAB — EXPECTORATED SPUTUM ASSESSMENT W GRAM STAIN, RFLX TO RESP C

## 2023-01-05 LAB — MRSA NEXT GEN BY PCR, NASAL: MRSA by PCR Next Gen: NOT DETECTED

## 2023-01-05 MED ORDER — POLYETHYLENE GLYCOL 3350 17 G PO PACK
17.0000 g | PACK | Freq: Every day | ORAL | Status: DC
  Administered 2023-01-05: 17 g via ORAL
  Filled 2023-01-05: qty 1

## 2023-01-05 MED ORDER — POLYETHYLENE GLYCOL 3350 17 G PO PACK
17.0000 g | PACK | Freq: Two times a day (BID) | ORAL | Status: DC
  Administered 2023-01-05 – 2023-01-09 (×5): 17 g via ORAL
  Filled 2023-01-05 (×8): qty 1

## 2023-01-05 MED ORDER — SACCHAROMYCES BOULARDII 250 MG PO CAPS
250.0000 mg | ORAL_CAPSULE | Freq: Two times a day (BID) | ORAL | Status: DC
  Administered 2023-01-05 – 2023-01-09 (×8): 250 mg via ORAL
  Filled 2023-01-05 (×8): qty 1

## 2023-01-05 MED ORDER — CHLORPROMAZINE HCL 25 MG PO TABS
25.0000 mg | ORAL_TABLET | Freq: Three times a day (TID) | ORAL | Status: DC | PRN
  Administered 2023-01-05 – 2023-01-09 (×4): 25 mg via ORAL
  Filled 2023-01-05 (×8): qty 1

## 2023-01-05 MED ORDER — SENNOSIDES-DOCUSATE SODIUM 8.6-50 MG PO TABS
2.0000 | ORAL_TABLET | Freq: Every day | ORAL | Status: DC
  Administered 2023-01-05 – 2023-01-08 (×4): 2 via ORAL
  Filled 2023-01-05 (×4): qty 2

## 2023-01-05 NOTE — Plan of Care (Signed)

## 2023-01-05 NOTE — Progress Notes (Signed)
PROGRESS NOTE   Stephen Fitzpatrick  MVH:[id number] DOB: 1945-04-25 DOA: 01/03/2023 PCP: Rebekah Chesterfield, NP   Chief Complaint  Patient presents with   Weakness   Level of care: Telemetry  Brief Admission History:  No notes on file   Assessment and Plan: Sepsis due to UTI  - Tachycardia at 124, leukocytosis at 23.8 - Negative COVID - Blood cultures pending: no growth to date  - Urine culture suggesting gram negative infection - CT abdomen pelvis shows mild hydronephrosis and hydroureter on the right with possible underlying stricture, recommended urine culture due to changes that could be consistent with UTI - Chest x-ray shows possible retrocardiac infiltrate -Patient initially started on broad-spectrum antibiotics, vancomycin and Zosyn - Continue zosyn pending C&S results   GERD (gastroesophageal reflux disease) - Continue Protonix  Hydronephrosis - With changes consistent with UTI - Patient reports dysuria for 4-5 days - Continue broad-spectrum antibiotics - Urine culture pending - Blood culture pending - Consult urology - renal US consistent with mild right hydronephrosis, no left hydro seen  HTN (hypertension) - Continue metoprolol  Constipation - added laxatives   Hiccoughs - thorazine ordered PRN   Chronic respiratory failure with hypoxia  - Reports using 4-5 L nasal cannula at home - Currently on 4 L nasal cannula - Chronic respiratory failure secondary to COPD - See COPD plan  COPD (chronic obstructive pulmonary disease)  - DuoNeb given in the ED - Continue Trelegy and as needed albuterol  PNA (pneumonia) - Retrocardiac culture - Continue broad-spectrum antibiotics - Sputum culture - Strep and Legionella urine antigens - Continue to monitor  DVT prophylaxis: SQ heparin Code Status: Full  Family Communication: son updated bedside  Disposition:    Consultants:   Procedures:   Antimicrobials:  Ceftriaxone   Subjective: Pt reports that  he has not had a bowel movement in several days and is constipated.   Objective: Vitals:   01/05/23 0451 01/05/23 0831 01/05/23 1301 01/05/23 1324  BP: 135/61  128/62   Pulse: 80  82   Resp: 19  20   Temp: 98.4 F (36.9 C)  98 F (36.7 C)   TempSrc: Oral  Oral   SpO2: 100% 96% 100% 98%  Weight:      Height:        Intake/Output Summary (Last 24 hours) at 01/05/2023 1813 Last data filed at 01/05/2023 1700 Gross per 24 hour  Intake 560 ml  Output 1050 ml  Net -490 ml   Filed Weights   01/03/23 1843 01/03/23 1919 01/04/23 0116  Weight: 75.1 kg 75.1 kg 66.6 kg   Examination:  General exam: Appears calm and comfortable  Respiratory system: Clear to auscultation. Respiratory effort normal. Cardiovascular system: normal S1 & S2 heard. No JVD, murmurs, rubs, gallops or clicks. No pedal edema. Gastrointestinal system: Abdomen is nondistended, soft and nontender. No organomegaly or masses felt. Normal bowel sounds heard. Central nervous system: Alert and oriented. No focal neurological deficits. Extremities: Symmetric 5 x 5 power. Skin: No rashes, lesions or ulcers. Psychiatry: Judgement and insight appear normal. Mood & affect appropriate.   Data Reviewed: I have personally reviewed following labs and imaging studies  CBC: Recent Labs  Lab 01/03/23 1839 01/04/23 0433 01/05/23 0818  WBC 23.8* 27.5* 20.9*  NEUTROABS 18.8* 22.9* 17.5*  HGB 12.6* 10.5* 9.8*  HCT 40.7 34.2* 32.3*  MCV 80.8 80.9 82.2  PLT 229 209 224    Basic Metabolic Panel: Recent Labs  Lab 01/03/23 1839 01/04/23 0433  NA 138 137  K 3.9 3.6  CL 99 100  CO2 29 26  GLUCOSE 124* 102*  BUN 44* 37*  CREATININE 1.17 1.11  CALCIUM 7.9* 7.5*  MG  --  2.3    CBG: No results for input(s): "GLUCAP" in the last 168 hours.  Recent Results (from the past 240 hour(s))  Blood Culture (routine x 2)     Status: None (Preliminary result)   Collection Time: 01/03/23  6:45 PM   Specimen: Right Antecubital;  Blood  Result Value Ref Range Status   Specimen Description RIGHT ANTECUBITAL  Final   Special Requests   Final    BOTTLES DRAWN AEROBIC AND ANAEROBIC Blood Culture results may not be optimal due to an inadequate volume of blood received in culture bottles   Culture   Final    NO GROWTH 2 DAYS Performed at [facility], [address] [place], [place] [address]    Report Status PENDING  Incomplete  Blood Culture (routine x 2)     Status: None (Preliminary result)   Collection Time: 01/03/23  6:50 PM   Specimen: Right Antecubital; Blood  Result Value Ref Range Status   Specimen Description RIGHT ANTECUBITAL  Final   Special Requests   Final    BOTTLES DRAWN AEROBIC AND ANAEROBIC Blood Culture results may not be optimal due to an excessive volume of blood received in culture bottles   Culture   Final    NO GROWTH 2 DAYS Performed at [facility], [address], [place], [place] [address]    Report Status PENDING  Incomplete  Resp panel by RT-PCR (RSV, Flu A&B, Covid) Anterior Nasal Swab     Status: None   Collection Time: 01/03/23  7:22 PM   Specimen: Anterior Nasal Swab  Result Value Ref Range Status   SARS Coronavirus 2 by RT PCR NEGATIVE NEGATIVE Final    Comment: (NOTE) SARS-CoV-2 target nucleic acids are NOT DETECTED.  The SARS-CoV-2 RNA is generally detectable in upper respiratory specimens during the acute phase of infection. The lowest concentration of SARS-CoV-2 viral copies this assay can detect is 138 copies/mL. A negative result does not preclude SARS-Cov-2 infection and should not be used as the sole basis for treatment or other patient management decisions. A negative result may occur with  improper specimen collection/handling, submission of specimen other than nasopharyngeal swab, presence of viral mutation(s) within the areas targeted by this assay, and inadequate number of viral copies(<138 copies/mL). A negative result must be combined with clinical  observations, patient history, and epidemiological information. The expected result is Negative.  Fact Sheet for Patients:  BloggerCourse.com  Fact Sheet for Healthcare Providers:  SeriousBroker.it  This test is no t yet approved or cleared by the [place] FDA and  has been authorized for detection and/or diagnosis of SARS-CoV-2 by FDA under an Emergency Use Authorization (EUA). This EUA will remain  in effect (meaning this test can be used) for the duration of the COVID-19 declaration under Section 564(b)(1) of the Act, 21 U.S.C.section 360bbb-3(b)(1), unless the authorization is terminated  or revoked sooner.       Influenza A by PCR NEGATIVE NEGATIVE Final   Influenza B by PCR NEGATIVE NEGATIVE Final    Comment: (NOTE) The Xpert Xpress SARS-CoV-2/FLU/RSV plus assay is intended as an aid in the diagnosis of influenza from Nasopharyngeal swab specimens and should not be used as a sole basis for treatment. Nasal washings and aspirates are unacceptable for Xpert Xpress SARS-CoV-2/FLU/RSV  testing.  Fact Sheet for Patients: BloggerCourse.com  Fact Sheet for Healthcare Providers: SeriousBroker.it  This test is not yet approved or cleared by the [place] FDA and has been authorized for detection and/or diagnosis of SARS-CoV-2 by FDA under an Emergency Use Authorization (EUA). This EUA will remain in effect (meaning this test can be used) for the duration of the COVID-19 declaration under Section 564(b)(1) of the Act, 21 U.S.C. section 360bbb-3(b)(1), unless the authorization is terminated or revoked.     Resp Syncytial Virus by PCR NEGATIVE NEGATIVE Final    Comment: (NOTE) Fact Sheet for Patients: BloggerCourse.com  Fact Sheet for Healthcare Providers: SeriousBroker.it  This test is not yet approved or cleared by  the [place] FDA and has been authorized for detection and/or diagnosis of SARS-CoV-2 by FDA under an Emergency Use Authorization (EUA). This EUA will remain in effect (meaning this test can be used) for the duration of the COVID-19 declaration under Section 564(b)(1) of the Act, 21 U.S.C. section 360bbb-3(b)(1), unless the authorization is terminated or revoked.  Performed at [facility], [address], [place], [place] [address]   Urine Culture     Status: Abnormal (Preliminary result)   Collection Time: 01/04/23 12:50 AM   Specimen: Urine, Random  Result Value Ref Range Status   Specimen Description   Final    URINE, RANDOM Performed at [facility], [address], [place], [place] [address]    Special Requests   Final    NONE Reflexed from [phone] Performed at [facility], [address] [place], [place] [address]    Culture (A)  Final    20,000 COLONIES/mL PSEUDOMONAS AERUGINOSA SUSCEPTIBILITIES TO FOLLOW Performed at [facility], [address], [place], [place] [address]    Report Status PENDING  Incomplete  MRSA Next Gen by PCR, Nasal     Status: None   Collection Time: 01/05/23  8:00 AM   Specimen: Nasal Mucosa; Nasal Swab  Result Value Ref Range Status   MRSA by PCR Next Gen NOT DETECTED NOT DETECTED Final    Comment: (NOTE) The GeneXpert MRSA Assay (FDA approved for NASAL specimens only), is one component of a comprehensive MRSA colonization surveillance program. It is not intended to diagnose MRSA infection nor to guide or monitor treatment for MRSA infections. Test performance is not FDA approved in patients less than 90 years old. Performed at [facility], [address] [place], [place] [address]   Expectorated Sputum Assessment w Gram Stain, Rflx to Resp Cult     Status: None   Collection Time: 01/05/23 10:49 AM   Specimen: Sputum  Result Value Ref Range Status   Specimen Description SPU  Final   Special Requests NONE  Final   Sputum  evaluation   Final    THIS SPECIMEN IS ACCEPTABLE FOR SPUTUM CULTURE Performed at [facility], [address] [place], [place] [address]    Report Status 01/05/2023 FINAL  Final     Radiology Studies: US RENAL  Result Date: 01/04/2023 CLINICAL DATA:  Hydronephrosis EXAM: RENAL / URINARY TRACT ULTRASOUND COMPLETE COMPARISON:  None Available. FINDINGS: Right Kidney: Renal measurements: 12.3 x 5.3 x 5.9 cm = volume: 201 mL. Echogenicity within normal limits. No mass. Mild right hydronephrosis. Left Kidney: Renal measurements: 11.2 x 5.7 x 6.1 cm = volume: 204 mL. Echogenicity within normal limits. No mass or hydronephrosis visualized. Bladder: Appears normal for degree of bladder distention. Other: None. IMPRESSION: 1. Mild right hydronephrosis similar to prior  CT. No calculus or other obstructing etiology identified by ultrasound. 2. No left hydronephrosis. Electronically Signed   By: Jearld Lesch M.D.   On: 01/04/2023 14:00   DG CHEST PORT 1 VIEW  Result Date: 01/04/2023 CLINICAL DATA:  Dyspnea. EXAM: PORTABLE CHEST 1 VIEW COMPARISON:  Radiograph yesterday, radiograph 10/13/2021 FINDINGS: Advanced emphysema. Left lung volume loss is similar to yesterday's exam. Left peri/suprahilar opacities are chronic. Mild patchy opacity at the left lung base is similar prior. Left costophrenic angle not entirely included in the field of view. Normal heart size with stable mediastinal contours. IMPRESSION: 1. Advanced emphysema. Chronic left lung volume loss and perihilar opacity. 2. Mild patchy opacity at the left lung base is similar to yesterday's exam and is indeterminate in etiology. Electronically Signed   By: Narda Rutherford M.D.   On: 01/04/2023 09:43   CT ABDOMEN PELVIS W CONTRAST  Result Date: 01/03/2023 CLINICAL DATA:  Acute nonlocalized abdominal pain, nausea, vomiting EXAM: CT ABDOMEN AND PELVIS WITH CONTRAST TECHNIQUE: Multidetector CT imaging of the abdomen and pelvis was performed using the  standard protocol following bolus administration of intravenous contrast. RADIATION DOSE REDUCTION: This exam was performed according to the departmental dose-optimization program which includes automated exposure control, adjustment of the mA and/or kV according to patient size and/or use of iterative reconstruction technique. CONTRAST:  OMNIPAQUE IOHEXOL 300 MG/ML  SOLN COMPARISON:  None Available. FINDINGS: Lower chest: Emphysema. Left-sided volume loss. Subpleural nodular opacity is incompletely visualized at the right lung base, axial image # 1, possibly infectious or inflammatory but indeterminate. Cardiac size within normal limits. Small hiatal hernia. Hepatobiliary: Multiple cysts scattered throughout the liver. The liver is otherwise unremarkable. No intra or extrahepatic biliary ductal dilation. Gallbladder unremarkable Pancreas: Unremarkable Spleen: Unremarkable Adrenals/Urinary Tract: Mild right hydronephrosis and hydroureter to the level of the distal right ureter along the right pelvic sidewall where there is an abrupt caliber change. No extrinsic mass or intraluminal calcifications identified and an underlying stricture is suspected. There is motion artifact that limits evaluation of the lower pole the left kidney. However, there is urothelial enhancement involving the distal left ureter suggesting asymmetric inflammatory change as can be seen with a recently passed calculus or ascending urinary tract infection. No hydronephrosis on the left. No intrarenal or ureteral calculi. No perinephric fluid collections are seen. The bladder is unremarkable Stomach/Bowel: Large volume stool within the rectal vault. Severe descending and sigmoid colonic diverticulosis. Small right inguinal hernia contains a single loop of unremarkable distal small bowel. Stomach, small bowel, and large bowel are otherwise unremarkable. No evidence of obstruction or focal inflammation. No free intraperitoneal gas or fluid.  Vascular/Lymphatic: Advanced aortoiliac atherosclerotic calcification. No aortic aneurysm. No pathologic adenopathy within the abdomen and pelvis. Reproductive: Mild prostatic hypertrophy. Other: Small fat containing left inguinal hernia Musculoskeletal: Left hip ORIF has been performed. Osseous structures are diffusely osteopenic. Advanced degenerative changes are seen within the lumbar spine with ankylosis of L3-L5. IMPRESSION: 1. Mild right hydronephrosis and hydroureter to the level of the distal right ureter along the right pelvic sidewall where there is an abrupt caliber change. No extrinsic mass or intraluminal calcifications identified and an underlying stricture is suspected. Correlation with ureteroscopy may be helpful for further evaluation. 2. Urothelial enhancement involving the distal left ureter suggesting asymmetric inflammatory change as can be seen with a recently passed calculus or ascending urinary tract infection. Correlation with urinalysis and urine culture may be helpful for further evaluation. 3. Large volume stool within the rectal  vault. 4. Severe descending and sigmoid colonic diverticulosis without superimposed acute inflammatory change. 5. Small right inguinal hernia containing a single loop of unremarkable distal small bowel. No evidence of obstruction. Aortic Atherosclerosis (ICD10-I70.0) and Emphysema (ICD10-J43.9). Electronically Signed   By: Helyn Numbers M.D.   On: 01/03/2023 21:29   DG Chest Port 1 View  Result Date: 01/03/2023 CLINICAL DATA:  Sepsis EXAM: PORTABLE CHEST 1 VIEW COMPARISON:  None Available. FINDINGS: Emphysema with left apical bulla formation. Stable left-sided volume loss. Possible retrocardiac atelectasis or infiltrate, though this may be artifactual and related to patient rotation on this examination. No pneumothorax or pleural effusion. Cardiac size within normal limits. No acute bone abnormality. IMPRESSION: 1. Emphysema. 2. Possible retrocardiac  atelectasis or infiltrate. A standard two view chest radiograph may be more helpful for further evaluation. Electronically Signed   By: Helyn Numbers M.D.   On: 01/03/2023 21:17    Scheduled Meds:  albuterol  2.5 mg Nebulization Q6H   feeding supplement  237 mL Oral TID PC & HS   fluticasone furoate-vilanterol  1 puff Inhalation Daily   And   umeclidinium bromide  1 puff Inhalation Daily   heparin  5,000 Units Subcutaneous Q8H   metoprolol tartrate  50 mg Oral BID   multivitamin with minerals  1 tablet Oral Daily   pantoprazole  40 mg Oral Daily   polyethylene glycol  17 g Oral BID   senna-docusate  2 tablet Oral QHS   Continuous Infusions:  piperacillin-tazobactam (ZOSYN)  IV 3.375 g (01/05/23 1418)     LOS: 2 days   Time spent: 47 mins  Islay Polanco Laural Benes, MD How to contact the [facility] Attending or Consulting provider 7A - 7P or covering provider during after hours 7P -7A, for this patient?  Check the care team in [facility] and look for a) attending/consulting [facility] provider listed and b) the [facility] team listed Log into [email or web address] and use [place]'s universal password to access. If you do not have the password, please contact the hospital operator. Locate the [facility] provider you are looking for under [facility] and page to a number that you can be directly reached. If you still have difficulty reaching the provider, please page the [facility] (Director on Call) for the Hospitalists listed on amion for assistance.  01/05/2023, 6:13 PM

## 2023-01-05 NOTE — Progress Notes (Signed)
Patient has slept very little during the night.  Patient son is at bedside. Prn medication given. Plan of care ongoing.

## 2023-01-05 NOTE — Progress Notes (Signed)
7829 Patient eating, unavailable for nebulizer at this time.

## 2023-01-05 NOTE — Progress Notes (Signed)
Patient requested "something to make me have a bowel movement". Message sent to MD on call. New orders received for am medication. Plan of care ongoing.

## 2023-01-06 ENCOUNTER — Inpatient Hospital Stay (HOSPITAL_COMMUNITY): Payer: Medicare HMO

## 2023-01-06 DIAGNOSIS — R6510 Systemic inflammatory response syndrome (SIRS) of non-infectious origin without acute organ dysfunction: Secondary | ICD-10-CM | POA: Diagnosis not present

## 2023-01-06 LAB — URINE CULTURE: Culture: 20000 — AB

## 2023-01-06 LAB — BASIC METABOLIC PANEL
Anion gap: 8 (ref 5–15)
BUN: 27 mg/dL — ABNORMAL HIGH (ref 8–23)
CO2: 27 mmol/L (ref 22–32)
Calcium: 7.3 mg/dL — ABNORMAL LOW (ref 8.9–10.3)
Chloride: 100 mmol/L (ref 98–111)
Creatinine, Ser: 1.23 mg/dL (ref 0.61–1.24)
GFR, Estimated: >60 mL/min (ref >60)
Glucose, Bld: 109 mg/dL — ABNORMAL HIGH (ref 70–99)
Potassium: 3.7 mmol/L (ref 3.5–5.1)
Sodium: 135 mmol/L (ref 135–145)

## 2023-01-06 LAB — CBC WITH DIFFERENTIAL/PLATELET
Abs Immature Granulocytes: 0.25 10*3/uL — ABNORMAL HIGH (ref 0.00–0.07)
Basophils Absolute: 0.1 10*3/uL (ref 0.0–0.1)
Basophils Relative: 0 %
Eosinophils Absolute: 0.4 10*3/uL (ref 0.0–0.5)
Eosinophils Relative: 2 %
HCT: 30.6 % — ABNORMAL LOW (ref 39.0–52.0)
Hemoglobin: 9.6 g/dL — ABNORMAL LOW (ref 13.0–17.0)
Immature Granulocytes: 1 %
Lymphocytes Relative: 3 %
Lymphs Abs: 0.6 10*3/uL — ABNORMAL LOW (ref 0.7–4.0)
MCH: 25.5 pg — ABNORMAL LOW (ref 26.0–34.0)
MCHC: 31.4 g/dL (ref 30.0–36.0)
MCV: 81.4 fL (ref 80.0–100.0)
Monocytes Absolute: 1.2 10*3/uL — ABNORMAL HIGH (ref 0.1–1.0)
Monocytes Relative: 7 %
Neutro Abs: 15.8 10*3/uL — ABNORMAL HIGH (ref 1.7–7.7)
Neutrophils Relative %: 87 %
Platelets: 252 10*3/uL (ref 150–400)
RBC: 3.76 MIL/uL — ABNORMAL LOW (ref 4.22–5.81)
RDW: 15.4 % (ref 11.5–15.5)
WBC: 18.3 10*3/uL — ABNORMAL HIGH (ref 4.0–10.5)
nRBC: 0 % (ref 0.0–0.2)

## 2023-01-06 LAB — LEGIONELLA PNEUMOPHILA SEROGP 1 UR AG: L. pneumophila Serogp 1 Ur Ag: NEGATIVE

## 2023-01-06 MED ORDER — PROCHLORPERAZINE EDISYLATE 10 MG/2ML IJ SOLN
10.0000 mg | INTRAMUSCULAR | Status: DC | PRN
  Administered 2023-01-09: 10 mg via INTRAVENOUS
  Filled 2023-01-06: qty 2

## 2023-01-06 MED ORDER — BISACODYL 10 MG RE SUPP
10.0000 mg | Freq: Once | RECTAL | Status: AC
  Administered 2023-01-06: 10 mg via RECTAL
  Filled 2023-01-06: qty 1

## 2023-01-06 MED ORDER — MILK AND MOLASSES ENEMA: 1.0000 | Freq: Once | RECTAL | Status: DC

## 2023-01-06 NOTE — Progress Notes (Signed)
In to admin dulcolax supp as ordered, pt noted to have large amount hard balls of stool in rectal vault. Manually disimpacted pt of large amount of brown colored, large sticky balls of stool. Pt still has large amount of stool above finger's reach. Supp admin'd to assist further evacuation of stool. Pt tolerated well, states, "Oh man, I feel so much better." Advised pt to call when he felt urge to have BM, pt stated understanding.

## 2023-01-06 NOTE — Consult Note (Signed)
Pharmacy Antibiotic Note  Stephen Fitzpatrick is a 77 y.o. male admitted on 01/03/2023 with pneumonia and UTI.  CTAP concerning for mild right hydronephrosis and hydroureter, perhaps secondary to underlying stricture. CXR concerning for possible retrocardiac infiltrate. According to patient, he has no prior history of UTI. Urine culture collected 9/27 growing 20,000 CFU/mL Zosyn with no reported resistance. Pharmacy has been consulted for Zosyn dosing.  Height: 6\' 3"  (190.5 cm) Weight: 66.6 kg (146 lb 13.2 oz) IBW/kg (Calculated) : 84.5  Temp (24hrs), Avg:98.2 F (36.8 C), Min:97.8 F (36.6 C), Max:98.5 F (36.9 C)  Recent Labs  Lab 01/03/23 1839 01/03/23 1845 01/03/23 2043 01/04/23 0433 01/05/23 0818 01/06/23 0725  WBC 23.8*  --   --  27.5* 20.9* 18.3*  CREATININE 1.17  --   --  1.11  --  1.23  LATICACIDVEN  --  1.6 2.0* 1.3  --   --     Estimated Creatinine Clearance: 46.6 mL/min (by C-G formula based on SCr of 1.23 mg/dL).    No Known Allergies  Antimicrobials this admission: 9/26 Zosyn >>  9/26 Vancomycin x 1  Dose adjustments this admission: N/A  Microbiology results: 9/26 BCx: NG3D 9/26 COVID/Flu/RSV: Negative 9/27 Ucx: 20,000 CFU/mL Psa   Plan: Day #3 of antibiotics (received only one dose on 9/26) Continue Zosyn 3.375 g IV q8H Continue to monitor Scr to assess for necessary dosing changes  Will M. Dareen Piano, PharmD Clinical Pharmacist 01/06/2023 3:30 PM

## 2023-01-06 NOTE — Progress Notes (Signed)
Patient slept most of the night during this shift.  No complaints of pain or nausea since 2100. Prn medication given. Plan of care ongoing.

## 2023-01-06 NOTE — Progress Notes (Signed)
Pt passed large amount of stool (hard balls and large amount brown liquid) incontinently s/p dulcolax supp admin. Rectal vault clear to digital exam. Pt continued to ooze liquid stool. Pt assisted up to [facility] and again passed moderate amount liquid brown stool. Pt bathed, shaved and hair washed, then assisted back to bed. Pt tolerated well. Pt set up for dinner meal. Family members at bedside.

## 2023-01-06 NOTE — Progress Notes (Signed)
PROGRESS NOTE   Stephen Fitzpatrick  KVQ:[id number] DOB: Aug 01, 1945 DOA: 01/03/2023 PCP: Rebekah Chesterfield, NP   Chief Complaint  Patient presents with   Weakness   Level of care: Telemetry  Brief Admission History:  77 y.o. male with medical history significant of carcinoma of the lung, CHF, COPD, GERD, anxiety, and more presents the ED with a chief complaint of dyspnea and hiccups that prevent him from sleeping.  Patient reports that he has had dyspnea and hiccups for 3-4 days.  His dyspnea is worse when laying flat.  He has a very weak cough but he reports that it is productive of white sputum.  He has had no hemoptysis.  Patient denies chest pain and palpitations.  He does report nausea that usually related to his cough.  He has not had any fevers.  Patient wears 4-5 L nasal cannula at home.  He denies any swelling in his legs.  He reports that he had decreased appetite for 4-5 days.  Patient also complains of dysuria for 4-5 days.  He reports it burns like somebody is taken 4 or 5 sticks of matches to him.  He has had no history of UTI in the past per his report.  Lastly on review of systems he complains of diarrhea.  He has been having diarrhea 2-3 times a day.  He denies any melena or hematochezia.  Patient has no other complaints at this time.   Assessment and Plan:  Sepsis due to Pseudomonas UTI  - presented with tachycardia at 124, leukocytosis at 23.8 - Negative COVID - Blood cultures pending: no growth to date  - Urine culture suggesting gram negative infection - CT abdomen pelvis shows mild hydronephrosis and hydroureter on the right with possible underlying stricture, recommended urine culture due to changes that could be consistent with UTI - Chest x-ray shows possible retrocardiac infiltrate -Patient initially started on broad-spectrum antibiotics, vancomycin and Zosyn - Continue zosyn pending C&S results - zosyn should cover pseudomonas  GERD (gastroesophageal reflux  disease) - Continue Protonix  Mild Right Hydronephrosis - With changes consistent with UTI - Patient reports dysuria for 4-5 days - Continue broad-spectrum antibiotics - Urine culture pending - Blood culture pending - Follow up urology outpatient  - renal US consistent with mild right hydronephrosis, no left hydro seen  HTN (hypertension) - Continue metoprolol  Constipation - added laxatives  - dulcolax suppository ordered   Nausea and Abdominal pain - secondary to constipation  - ordered KUB  - suppository ordered  - if not effective, enema ordered   Hiccoughs - thorazine ordered PRN   Chronic respiratory failure with hypoxia  - Reports using 4-5 L nasal cannula at home - Currently on 4 L nasal cannula - Chronic respiratory failure secondary to COPD - See COPD plan  COPD (chronic obstructive pulmonary disease)  - DuoNeb given in the ED - Continue Trelegy and as needed albuterol  PNA (pneumonia) - Retrocardiac culture - Continue broad-spectrum antibiotics - Sputum culture - Strep and Legionella urine antigens - Continue to monitor  DVT prophylaxis: SQ heparin Code Status: Full  Family Communication: son updated bedside  Disposition:    Consultants:   Procedures:   Antimicrobials:  Ceftriaxone   Subjective: Complains of severe nausea, abdominal distension, had small BM yesterday, constipated   Objective: Vitals:   01/05/23 2050 01/06/23 0139 01/06/23 0526 01/06/23 0854  BP: (!) 144/73  (!) 153/78   Pulse: 97  86   Resp: 20  18  Temp: 98.5 F (36.9 C)  98.4 F (36.9 C)   TempSrc: Oral     SpO2: 100% 100% 100% 98%  Weight:      Height:        Intake/Output Summary (Last 24 hours) at 01/06/2023 1113 Last data filed at 01/06/2023 0950 Gross per 24 hour  Intake 440 ml  Output 1151 ml  Net -711 ml   Filed Weights   01/03/23 1843 01/03/23 1919 01/04/23 0116  Weight: 75.1 kg 75.1 kg 66.6 kg   Examination:  General exam: Appears calm and  comfortable  Respiratory system: Clear to auscultation. Respiratory effort normal. Cardiovascular system: normal S1 & S2 heard. No JVD, murmurs, rubs, gallops or clicks. No pedal edema. Gastrointestinal system: Abdomen is nondistended, soft and nontender. No organomegaly or masses felt. Normal bowel sounds heard. Central nervous system: Alert and oriented. No focal neurological deficits. Extremities: Symmetric 5 x 5 power. Skin: No rashes, lesions or ulcers. Psychiatry: Judgement and insight appear normal. Mood & affect appropriate.   Data Reviewed: I have personally reviewed following labs and imaging studies  CBC: Recent Labs  Lab 01/03/23 1839 01/04/23 0433 01/05/23 0818 01/06/23 0725  WBC 23.8* 27.5* 20.9* 18.3*  NEUTROABS 18.8* 22.9* 17.5* 15.8*  HGB 12.6* 10.5* 9.8* 9.6*  HCT 40.7 34.2* 32.3* 30.6*  MCV 80.8 80.9 82.2 81.4  PLT 229 209 224 252    Basic Metabolic Panel: Recent Labs  Lab 01/03/23 1839 01/04/23 0433 01/06/23 0725  NA 138 137 135  K 3.9 3.6 3.7  CL 99 100 100  CO2 29 26 27   GLUCOSE 124* 102* 109*  BUN 44* 37* 27*  CREATININE 1.17 1.11 1.23  CALCIUM 7.9* 7.5* 7.3*  MG  --  2.3  --     CBG: No results for input(s): "GLUCAP" in the last 168 hours.  Recent Results (from the past 240 hour(s))  Blood Culture (routine x 2)     Status: None (Preliminary result)   Collection Time: 01/03/23  6:45 PM   Specimen: Right Antecubital; Blood  Result Value Ref Range Status   Specimen Description RIGHT ANTECUBITAL  Final   Special Requests   Final    BOTTLES DRAWN AEROBIC AND ANAEROBIC Blood Culture results may not be optimal due to an inadequate volume of blood received in culture bottles   Culture   Final    NO GROWTH 3 DAYS Performed at [facility], [address] [place], [place] [address]    Report Status PENDING  Incomplete  Blood Culture (routine x 2)     Status: None (Preliminary result)   Collection Time: 01/03/23  6:50 PM   Specimen: Right  Antecubital; Blood  Result Value Ref Range Status   Specimen Description RIGHT ANTECUBITAL  Final   Special Requests   Final    BOTTLES DRAWN AEROBIC AND ANAEROBIC Blood Culture results may not be optimal due to an excessive volume of blood received in culture bottles   Culture   Final    NO GROWTH 3 DAYS Performed at [facility], [address], [place], [place] [address]    Report Status PENDING  Incomplete  Resp panel by RT-PCR (RSV, Flu A&B, Covid) Anterior Nasal Swab     Status: None   Collection Time: 01/03/23  7:22 PM   Specimen: Anterior Nasal Swab  Result Value Ref Range Status   SARS Coronavirus 2 by RT PCR NEGATIVE NEGATIVE Final    Comment: (NOTE) SARS-CoV-2 target nucleic acids are NOT DETECTED.  The SARS-CoV-2 RNA is generally detectable in upper respiratory specimens during the acute phase of infection. The lowest concentration of SARS-CoV-2 viral copies this assay can detect is 138 copies/mL. A negative result does not preclude SARS-Cov-2 infection and should not be used as the sole basis for treatment or other patient management decisions. A negative result may occur with  improper specimen collection/handling, submission of specimen other than nasopharyngeal swab, presence of viral mutation(s) within the areas targeted by this assay, and inadequate number of viral copies(<138 copies/mL). A negative result must be combined with clinical observations, patient history, and epidemiological information. The expected result is Negative.  Fact Sheet for Patients:  BloggerCourse.com  Fact Sheet for Healthcare Providers:  SeriousBroker.it  This test is no t yet approved or cleared by the [place] FDA and  has been authorized for detection and/or diagnosis of SARS-CoV-2 by FDA under an Emergency Use Authorization (EUA). This EUA will remain  in effect (meaning this test can be used) for the duration of the COVID-19  declaration under Section 564(b)(1) of the Act, 21 U.S.C.section 360bbb-3(b)(1), unless the authorization is terminated  or revoked sooner.       Influenza A by PCR NEGATIVE NEGATIVE Final   Influenza B by PCR NEGATIVE NEGATIVE Final    Comment: (NOTE) The Xpert Xpress SARS-CoV-2/FLU/RSV plus assay is intended as an aid in the diagnosis of influenza from Nasopharyngeal swab specimens and should not be used as a sole basis for treatment. Nasal washings and aspirates are unacceptable for Xpert Xpress SARS-CoV-2/FLU/RSV testing.  Fact Sheet for Patients: BloggerCourse.com  Fact Sheet for Healthcare Providers: SeriousBroker.it  This test is not yet approved or cleared by the [place] FDA and has been authorized for detection and/or diagnosis of SARS-CoV-2 by FDA under an Emergency Use Authorization (EUA). This EUA will remain in effect (meaning this test can be used) for the duration of the COVID-19 declaration under Section 564(b)(1) of the Act, 21 U.S.C. section 360bbb-3(b)(1), unless the authorization is terminated or revoked.     Resp Syncytial Virus by PCR NEGATIVE NEGATIVE Final    Comment: (NOTE) Fact Sheet for Patients: BloggerCourse.com  Fact Sheet for Healthcare Providers: SeriousBroker.it  This test is not yet approved or cleared by the [place] FDA and has been authorized for detection and/or diagnosis of SARS-CoV-2 by FDA under an Emergency Use Authorization (EUA). This EUA will remain in effect (meaning this test can be used) for the duration of the COVID-19 declaration under Section 564(b)(1) of the Act, 21 U.S.C. section 360bbb-3(b)(1), unless the authorization is terminated or revoked.  Performed at [facility], [address] [place], [place] [address]   Urine Culture     Status: Abnormal   Collection Time: 01/04/23 12:50 AM   Specimen: Urine,  Random  Result Value Ref Range Status   Specimen Description   Final    URINE, RANDOM Performed at [facility], [address], [place], [place] [address]    Special Requests   Final    NONE Reflexed from [id number] Performed at [facility], [address], [place], [place] [address]    Culture 20,000 COLONIES/mL PSEUDOMONAS AERUGINOSA (A)  Final   Report Status 01/06/2023 FINAL  Final   Organism ID, Bacteria PSEUDOMONAS AERUGINOSA (A)  Final      Susceptibility   Pseudomonas aeruginosa - MIC*    CEFTAZIDIME 2 SENSITIVE Sensitive     CIPROFLOXACIN <=0.25 SENSITIVE Sensitive     GENTAMICIN <=1 SENSITIVE Sensitive  IMIPENEM 1 SENSITIVE Sensitive     PIP/TAZO 8 SENSITIVE Sensitive     CEFEPIME 2 SENSITIVE Sensitive     * 20,000 COLONIES/mL PSEUDOMONAS AERUGINOSA  MRSA Next Gen by PCR, Nasal     Status: None   Collection Time: 01/05/23  8:00 AM   Specimen: Nasal Mucosa; Nasal Swab  Result Value Ref Range Status   MRSA by PCR Next Gen NOT DETECTED NOT DETECTED Final    Comment: (NOTE) The GeneXpert MRSA Assay (FDA approved for NASAL specimens only), is one component of a comprehensive MRSA colonization surveillance program. It is not intended to diagnose MRSA infection nor to guide or monitor treatment for MRSA infections. Test performance is not FDA approved in patients less than 41 years old. Performed at [facility], [address] [place], [place] [address]   Expectorated Sputum Assessment w Gram Stain, Rflx to Resp Cult     Status: None   Collection Time: 01/05/23 10:49 AM   Specimen: Sputum  Result Value Ref Range Status   Specimen Description SPU  Final   Special Requests NONE  Final   Sputum evaluation   Final    THIS SPECIMEN IS ACCEPTABLE FOR SPUTUM CULTURE Performed at [facility], [address], [place], [place] [address]    Report Status 01/05/2023 FINAL  Final  Culture, Respiratory w Gram Stain     Status: None (Preliminary result)   Collection Time: 01/05/23  10:49 AM   Specimen: Sputum  Result Value Ref Range Status   Specimen Description   Final    SPU Performed at [facility], [address], [place], [place] [address]    Special Requests   Final    NONE Reflexed from [id number] Performed at [facility], [address] [place], [place] [address]    Gram Stain   Final    RARE SQUAMOUS EPITHELIAL CELLS PRESENT FEW WBC PRESENT, PREDOMINANTLY MONONUCLEAR NO ORGANISMS SEEN Performed at [facility], [address], [place], [place] [address]    Culture PENDING  Incomplete   Report Status PENDING  Incomplete     Radiology Studies: No results found.  Scheduled Meds:  albuterol  2.5 mg Nebulization Q6H   bisacodyl  10 mg Rectal Once   feeding supplement  237 mL Oral TID PC & HS   fluticasone furoate-vilanterol  1 puff Inhalation Daily   And   umeclidinium bromide  1 puff Inhalation Daily   heparin  5,000 Units Subcutaneous Q8H   metoprolol tartrate  50 mg Oral BID   multivitamin with minerals  1 tablet Oral Daily   pantoprazole  40 mg Oral Daily   polyethylene glycol  17 g Oral BID   saccharomyces boulardii  250 mg Oral BID   senna-docusate  2 tablet Oral QHS   Continuous Infusions:  piperacillin-tazobactam (ZOSYN)  IV 3.375 g (01/06/23 0533)     LOS: 3 days   Time spent: 50 mins  Aspin Palomarez Laural Benes, MD How to contact the [facility] Attending or Consulting provider 7A - 7P or covering provider during after hours 7P -7A, for this patient?  Check the care team in [facility] and look for a) attending/consulting TRH provider listed and b) the [facility] team listed Log into [email or web address] and use [facility] universal password to access. If you do not have the password, please contact the hospital operator. Locate the [facility] provider you are looking for under [facility] and page to a number that you can be directly reached. If you  still have difficulty reaching the provider, please page the [facility] (Director on Call) for the Hospitalists listed  on amion for assistance.  01/06/2023, 11:13 AM

## 2023-01-06 NOTE — Hospital Course (Signed)
77 y.o. male with medical history significant of carcinoma of the lung, CHF, COPD, GERD, anxiety, and more presents the ED with a chief complaint of dyspnea and hiccups that prevent him from sleeping.  Patient reports that he has had dyspnea and hiccups for 3-4 days.  His dyspnea is worse when laying flat.  He has a very weak cough but he reports that it is productive of white sputum.  He has had no hemoptysis.  Patient denies chest pain and palpitations.  He does report nausea that usually related to his cough.  He has not had any fevers.  Patient wears 4-5 L nasal cannula at home.  He denies any swelling in his legs.  He reports that he had decreased appetite for 4-5 days.  Patient also complains of dysuria for 4-5 days.  He reports it burns like somebody is taken 4 or 5 sticks of matches to him.  He has had no history of UTI in the past per his report.  Lastly on review of systems he complains of diarrhea.  He has been having diarrhea 2-3 times a day.  He denies any melena or hematochezia.  Patient has no other complaints at this time.

## 2023-01-07 DIAGNOSIS — R6510 Systemic inflammatory response syndrome (SIRS) of non-infectious origin without acute organ dysfunction: Secondary | ICD-10-CM | POA: Diagnosis not present

## 2023-01-07 DIAGNOSIS — A419 Sepsis, unspecified organism: Secondary | ICD-10-CM

## 2023-01-07 LAB — CBC WITH DIFFERENTIAL/PLATELET
Abs Immature Granulocytes: 0.19 10*3/uL — ABNORMAL HIGH (ref 0.00–0.07)
Basophils Absolute: 0.1 10*3/uL (ref 0.0–0.1)
Basophils Relative: 0 %
Eosinophils Absolute: 0.4 10*3/uL (ref 0.0–0.5)
Eosinophils Relative: 2 %
HCT: 32.4 % — ABNORMAL LOW (ref 39.0–52.0)
Hemoglobin: 9.6 g/dL — ABNORMAL LOW (ref 13.0–17.0)
Immature Granulocytes: 1 %
Lymphocytes Relative: 3 %
Lymphs Abs: 0.6 10*3/uL — ABNORMAL LOW (ref 0.7–4.0)
MCH: 24.7 pg — ABNORMAL LOW (ref 26.0–34.0)
MCHC: 29.6 g/dL — ABNORMAL LOW (ref 30.0–36.0)
MCV: 83.3 fL (ref 80.0–100.0)
Monocytes Absolute: 1.1 10*3/uL — ABNORMAL HIGH (ref 0.1–1.0)
Monocytes Relative: 6 %
Neutro Abs: 15.8 10*3/uL — ABNORMAL HIGH (ref 1.7–7.7)
Neutrophils Relative %: 88 %
Platelets: 303 10*3/uL (ref 150–400)
RBC: 3.89 MIL/uL — ABNORMAL LOW (ref 4.22–5.81)
RDW: 15.6 % — ABNORMAL HIGH (ref 11.5–15.5)
WBC: 18.1 10*3/uL — ABNORMAL HIGH (ref 4.0–10.5)
nRBC: 0 % (ref 0.0–0.2)

## 2023-01-07 LAB — COMPREHENSIVE METABOLIC PANEL
ALT: 14 U/L (ref 0–44)
AST: 21 U/L (ref 15–41)
Albumin: 2.3 g/dL — ABNORMAL LOW (ref 3.5–5.0)
Alkaline Phosphatase: 49 U/L (ref 38–126)
Anion gap: 8 (ref 5–15)
BUN: 22 mg/dL (ref 8–23)
CO2: 29 mmol/L (ref 22–32)
Calcium: 7.6 mg/dL — ABNORMAL LOW (ref 8.9–10.3)
Chloride: 99 mmol/L (ref 98–111)
Creatinine, Ser: 1.17 mg/dL (ref 0.61–1.24)
GFR, Estimated: >60 mL/min (ref >60)
Glucose, Bld: 114 mg/dL — ABNORMAL HIGH (ref 70–99)
Potassium: 3.9 mmol/L (ref 3.5–5.1)
Sodium: 136 mmol/L (ref 135–145)
Total Bilirubin: 0.4 mg/dL (ref 0.3–1.2)
Total Protein: 5.7 g/dL — ABNORMAL LOW (ref 6.5–8.1)

## 2023-01-07 MED ORDER — ALBUTEROL SULFATE (2.5 MG/3ML) 0.083% IN NEBU
2.5000 mg | INHALATION_SOLUTION | Freq: Three times a day (TID) | RESPIRATORY_TRACT | Status: DC
  Administered 2023-01-08 – 2023-01-09 (×5): 2.5 mg via RESPIRATORY_TRACT
  Filled 2023-01-07 (×5): qty 3

## 2023-01-07 NOTE — TOC Initial Note (Signed)
Transition of Care [facility]) - Initial/Assessment Note    Patient Details  Name: Stephen Fitzpatrick MRN: [id number] Date of Birth: 01-09-1946  Transition of Care [facility]) CM/SW Contact:    Leitha Bleak, RN Phone Number: 01/07/2023, 1:37 PM  Clinical Narrative:     Patient admitted with SIRS. PT is recommending HHPT. CM spoke with patient, he requested I call his son. Son,Steven is agreeable to with HHPT.  Sarah with [facility] accepted the referral. MD ordered. DC planning for tomorrow.              Expected Discharge Plan: Home w Home Health Services Barriers to Discharge: Continued Medical Work up   Patient Goals and CMS Choice Patient states their goals for this hospitalization and ongoing recovery are:: to go home. CMS Medicare.gov Compare Post Acute Care list provided to:: Patient Represenative (must comment) Choice offered to / list presented to : Adult Children      Expected Discharge Plan and Services       Living arrangements for the past 2 months: Single Family Home                           HH Arranged: PT   Date HH Agency Contacted: 01/07/23 Time HH Agency Contacted: 1337 Representative spoke with at [facility] Agency: Versie Starks  Prior Living Arrangements/Services Living arrangements for the past 2 months: Single Family Home Lives with:: Spouse Patient language and need for interpreter reviewed:: Yes Do you feel safe going back to the place where you live?: Yes      Need for Family Participation in Patient Care: Yes (Comment) Care giver support system in place?: Yes (comment)   Criminal Activity/Legal Involvement Pertinent to Current Situation/Hospitalization: No - Comment as needed  Activities of Daily Living   ADL Screening (condition at time of admission) Does the patient have a NEW difficulty with bathing/dressing/toileting/self-feeding that is expected to last >3 days?: No Does the patient have a NEW difficulty with getting in/out of bed, walking, or  climbing stairs that is expected to last >3 days?: No Does the patient have a NEW difficulty with communication that is expected to last >3 days?: No Is the patient deaf or have difficulty hearing?: No Does the patient have difficulty seeing, even when wearing glasses/contacts?: No Does the patient have difficulty concentrating, remembering, or making decisions?: No  Permission Sought/Granted            Permission granted to share info w Relationship: Son     Emotional Assessment     Affect (typically observed): Accepting Orientation: : Oriented to Self, Oriented to Place, Oriented to  Time, Oriented to Situation Alcohol / Substance Use: Not Applicable Psych Involvement: No (comment)  Admission diagnosis:  SIRS (systemic inflammatory response syndrome) (HCC) [R65.10] Ureteral stricture [N13.5] Pneumonia due to infectious organism, unspecified laterality, unspecified part of lung [J18.9] Sepsis, due to unspecified organism, unspecified whether acute organ dysfunction present [facility]) [A41.9] Patient Active Problem List   Diagnosis Date Noted   Hydronephrosis 01/04/2023   GERD (gastroesophageal reflux disease) 01/04/2023   Protein-calorie malnutrition, severe 01/04/2023   SIRS (systemic inflammatory response syndrome) (HCC) 01/03/2023   Closed left hip fracture (HCC) 10/13/2021   COPD (chronic obstructive pulmonary disease) (HCC) 10/13/2021   Anxiety 10/13/2021   Chronic respiratory failure with hypoxia (HCC) 10/13/2021   HTN (hypertension) 10/13/2021   Former tobacco use 10/13/2021   History of cerebrovascular accident (CVA) due to ischemia 10/13/2021  PNA (pneumonia) 02/21/2012   Leukocytosis 02/21/2012   Adenocarcinoma of Left Lower Lobe lung    Nodule of left lower lung    PCP:  Rebekah Chesterfield, NP Pharmacy:   [facility] [phone] - [place] - [address] [place] [address] Phone: [phone] Fax: [phone]  [facility] [place], [place] - [address] [place] [address] Phone: [phone] Fax: [phone]  [facility] [address] [place] - [place]  [address] [place] [address] [place] [address] Phone: [phone] Fax: [phone]    Social Determinants of Health (SDOH) Social History: SDOH Screenings   Food Insecurity: No Food Insecurity (01/04/2023)  Housing: Low Risk  (01/04/2023)  Transportation Needs: No Transportation Needs (01/04/2023)  Utilities: Not At Risk (01/04/2023)  Stress: No Stress Concern Present (12/06/2019)   Received from [facility]  Tobacco Use: Medium Risk (01/03/2023)  Health Literacy: Low Risk  (12/06/2019)   Received from [facility]   SDOH Interventions:    Readmission Risk Interventions    01/07/2023    1:35 PM 10/16/2021    2:06 PM  Readmission Risk Prevention Plan  Post Dischage Appt Not Complete   Medication Screening Complete   Transportation Screening Complete Complete  Home Care Screening  Complete  Medication Review (RN CM)  Complete

## 2023-01-07 NOTE — Progress Notes (Signed)
PROGRESS NOTE   Stephen Fitzpatrick  MWU:[id number] DOB: 1946-02-26 DOA: 01/03/2023 PCP: Rebekah Chesterfield, NP   Chief Complaint  Patient presents with   Weakness   Level of care: Telemetry  Brief Admission History:  77 y.o. male with medical history significant of carcinoma of the lung, CHF, COPD, GERD, anxiety, and more presents the ED with a chief complaint of dyspnea and hiccups that prevent him from sleeping.  Patient reports that he has had dyspnea and hiccups for 3-4 days.  His dyspnea is worse when laying flat.  He has a very weak cough but he reports that it is productive of white sputum.  He has had no hemoptysis.  Patient denies chest pain and palpitations.  He does report nausea that usually related to his cough.  He has not had any fevers.  Patient wears 4-5 L nasal cannula at home.  He denies any swelling in his legs.  He reports that he had decreased appetite for 4-5 days.  Patient also complains of dysuria for 4-5 days.  He reports it burns like somebody is taken 4 or 5 sticks of matches to him.  He has had no history of UTI in the past per his report.  Lastly on review of systems he complains of diarrhea.  He has been having diarrhea 2-3 times a day.  He denies any melena or hematochezia.  Patient has no other complaints at this time.   Assessment and Plan:  Sepsis due to Pseudomonas UTI  - presented with tachycardia at 124, leukocytosis at 23.8 - Negative COVID - Blood cultures pending: no growth to date  - Urine culture suggesting gram negative infection - CT abdomen pelvis shows mild hydronephrosis and hydroureter on the right with possible underlying stricture, recommended urine culture due to changes that could be consistent with UTI - Chest x-ray shows possible retrocardiac infiltrate -Patient initially started on broad-spectrum antibiotics, vancomycin and Zosyn - Continue zosyn thru 9/30.    GERD (gastroesophageal reflux disease) - Continue Protonix  Mild Right  Hydronephrosis - With changes consistent with UTI - Patient reports dysuria for 4-5 days - Continue broad-spectrum antibiotics - Urine culture pending - Blood culture pending - Follow up urology outpatient  - renal US consistent with mild right hydronephrosis, no left hydro seen  HTN (hypertension) - Continue metoprolol  Constipation - added laxatives  - dulcolax suppository ordered   Nausea and Abdominal pain - secondary to constipation  - ordered KUB  - suppository ordered  - if not effective, enema ordered   Hiccoughs - thorazine ordered PRN   Chronic respiratory failure with hypoxia  - Reports using 4-5 L nasal cannula at home - Currently on 4 L nasal cannula - Chronic respiratory failure secondary to COPD - See COPD plan  COPD (chronic obstructive pulmonary disease)  - DuoNeb given in the ED - Continue Trelegy and as needed albuterol  PNA (pneumonia) - treated with broad-spectrum antibiotics   DVT prophylaxis: SQ heparin Code Status: Full  Family Communication: son updated bedside  Disposition:    Consultants:   Procedures:   Antimicrobials:  Ceftriaxone   Subjective: Feels better after being disimpacted yesterday.   Objective: Vitals:   01/06/23 2014 01/07/23 0326 01/07/23 0820 01/07/23 0827  BP:  (!) 152/67    Pulse:  89    Resp:  17    Temp:  98.7 F (37.1 C)    TempSrc:  Oral    SpO2: 98% 100% 96% 100%  Weight:  Height:        Intake/Output Summary (Last 24 hours) at 01/07/2023 1108 Last data filed at 01/07/2023 0900 Gross per 24 hour  Intake 240 ml  Output 1650 ml  Net -1410 ml   Filed Weights   01/03/23 1843 01/03/23 1919 01/04/23 0116  Weight: 75.1 kg 75.1 kg 66.6 kg   Examination:  General exam: Appears calm and comfortable  Respiratory system: Clear to auscultation. Respiratory effort normal. Cardiovascular system: normal S1 & S2 heard. No JVD, murmurs, rubs, gallops or clicks. No pedal edema. Gastrointestinal system:  Abdomen is nondistended, soft and nontender. No organomegaly or masses felt. Normal bowel sounds heard. Central nervous system: Alert and oriented. No focal neurological deficits. Extremities: Symmetric 5 x 5 power. Skin: No rashes, lesions or ulcers. Psychiatry: Judgement and insight appear normal. Mood & affect appropriate.   Data Reviewed: I have personally reviewed following labs and imaging studies  CBC: Recent Labs  Lab 01/03/23 1839 01/04/23 0433 01/05/23 0818 01/06/23 0725 01/07/23 0503  WBC 23.8* 27.5* 20.9* 18.3* 18.1*  NEUTROABS 18.8* 22.9* 17.5* 15.8* 15.8*  HGB 12.6* 10.5* 9.8* 9.6* 9.6*  HCT 40.7 34.2* 32.3* 30.6* 32.4*  MCV 80.8 80.9 82.2 81.4 83.3  PLT 229 209 224 252 303    Basic Metabolic Panel: Recent Labs  Lab 01/03/23 1839 01/04/23 0433 01/06/23 0725 01/07/23 0503  NA 138 137 135 136  K 3.9 3.6 3.7 3.9  CL 99 100 100 99  CO2 29 26 27 29   GLUCOSE 124* 102* 109* 114*  BUN 44* 37* 27* 22  CREATININE 1.17 1.11 1.23 1.17  CALCIUM 7.9* 7.5* 7.3* 7.6*  MG  --  2.3  --   --     CBG: No results for input(s): "GLUCAP" in the last 168 hours.  Recent Results (from the past 240 hour(s))  Blood Culture (routine x 2)     Status: None (Preliminary result)   Collection Time: 01/03/23  6:45 PM   Specimen: Right Antecubital; Blood  Result Value Ref Range Status   Specimen Description RIGHT ANTECUBITAL  Final   Special Requests   Final    BOTTLES DRAWN AEROBIC AND ANAEROBIC Blood Culture results may not be optimal due to an inadequate volume of blood received in culture bottles   Culture   Final    NO GROWTH 4 DAYS Performed at [facility], [address], [place], [place] [address]    Report Status PENDING  Incomplete  Blood Culture (routine x 2)     Status: None (Preliminary result)   Collection Time: 01/03/23  6:50 PM   Specimen: Right Antecubital; Blood  Result Value Ref Range Status   Specimen Description RIGHT ANTECUBITAL  Final   Special Requests    Final    BOTTLES DRAWN AEROBIC AND ANAEROBIC Blood Culture results may not be optimal due to an excessive volume of blood received in culture bottles   Culture   Final    NO GROWTH 4 DAYS Performed at [facility], [address] [place], [place] [address]    Report Status PENDING  Incomplete  Resp panel by RT-PCR (RSV, Flu A&B, Covid) Anterior Nasal Swab     Status: None   Collection Time: 01/03/23  7:22 PM   Specimen: Anterior Nasal Swab  Result Value Ref Range Status   SARS Coronavirus 2 by RT PCR NEGATIVE NEGATIVE Final    Comment: (NOTE) SARS-CoV-2 target nucleic acids are NOT DETECTED.  The SARS-CoV-2 RNA is generally detectable in upper  respiratory specimens during the acute phase of infection. The lowest concentration of SARS-CoV-2 viral copies this assay can detect is 138 copies/mL. A negative result does not preclude SARS-Cov-2 infection and should not be used as the sole basis for treatment or other patient management decisions. A negative result may occur with  improper specimen collection/handling, submission of specimen other than nasopharyngeal swab, presence of viral mutation(s) within the areas targeted by this assay, and inadequate number of viral copies(<138 copies/mL). A negative result must be combined with clinical observations, patient history, and epidemiological information. The expected result is Negative.  Fact Sheet for Patients:  BloggerCourse.com  Fact Sheet for Healthcare Providers:  SeriousBroker.it  This test is no t yet approved or cleared by the [place] FDA and  has been authorized for detection and/or diagnosis of SARS-CoV-2 by FDA under an Emergency Use Authorization (EUA). This EUA will remain  in effect (meaning this test can be used) for the duration of the COVID-19 declaration under Section 564(b)(1) of the Act, 21 U.S.C.section 360bbb-3(b)(1), unless the authorization is  terminated  or revoked sooner.       Influenza A by PCR NEGATIVE NEGATIVE Final   Influenza B by PCR NEGATIVE NEGATIVE Final    Comment: (NOTE) The Xpert Xpress SARS-CoV-2/FLU/RSV plus assay is intended as an aid in the diagnosis of influenza from Nasopharyngeal swab specimens and should not be used as a sole basis for treatment. Nasal washings and aspirates are unacceptable for Xpert Xpress SARS-CoV-2/FLU/RSV testing.  Fact Sheet for Patients: BloggerCourse.com  Fact Sheet for Healthcare Providers: SeriousBroker.it  This test is not yet approved or cleared by the [place] FDA and has been authorized for detection and/or diagnosis of SARS-CoV-2 by FDA under an Emergency Use Authorization (EUA). This EUA will remain in effect (meaning this test can be used) for the duration of the COVID-19 declaration under Section 564(b)(1) of the Act, 21 U.S.C. section 360bbb-3(b)(1), unless the authorization is terminated or revoked.     Resp Syncytial Virus by PCR NEGATIVE NEGATIVE Final    Comment: (NOTE) Fact Sheet for Patients: BloggerCourse.com  Fact Sheet for Healthcare Providers: SeriousBroker.it  This test is not yet approved or cleared by the [place] FDA and has been authorized for detection and/or diagnosis of SARS-CoV-2 by FDA under an Emergency Use Authorization (EUA). This EUA will remain in effect (meaning this test can be used) for the duration of the COVID-19 declaration under Section 564(b)(1) of the Act, 21 U.S.C. section 360bbb-3(b)(1), unless the authorization is terminated or revoked.  Performed at [facility], [address] [place], [place] [address]   Urine Culture     Status: Abnormal   Collection Time: 01/04/23 12:50 AM   Specimen: Urine, Random  Result Value Ref Range Status   Specimen Description   Final    URINE, RANDOM Performed at [facility], [address], [place], [place] [address]    Special Requests   Final    NONE Reflexed from [phone] [id number] Performed at [facility], [address], [place], [place] [address]    Culture 20,000 COLONIES/mL PSEUDOMONAS AERUGINOSA (A)  Final   Report Status 01/06/2023 FINAL  Final   Organism ID, Bacteria PSEUDOMONAS AERUGINOSA (A)  Final      Susceptibility   Pseudomonas aeruginosa - MIC*    CEFTAZIDIME 2 SENSITIVE Sensitive     CIPROFLOXACIN <=0.25 SENSITIVE Sensitive     GENTAMICIN <=1 SENSITIVE Sensitive     IMIPENEM 1 SENSITIVE Sensitive  PIP/TAZO 8 SENSITIVE Sensitive     CEFEPIME 2 SENSITIVE Sensitive     * 20,000 COLONIES/mL PSEUDOMONAS AERUGINOSA  MRSA Next Gen by PCR, Nasal     Status: None   Collection Time: 01/05/23  8:00 AM   Specimen: Nasal Mucosa; Nasal Swab  Result Value Ref Range Status   MRSA by PCR Next Gen NOT DETECTED NOT DETECTED Final    Comment: (NOTE) The GeneXpert MRSA Assay (FDA approved for NASAL specimens only), is one component of a comprehensive MRSA colonization surveillance program. It is not intended to diagnose MRSA infection nor to guide or monitor treatment for MRSA infections. Test performance is not FDA approved in patients less than 43 years old. Performed at [facility], [address], [place], [place] [address]   Expectorated Sputum Assessment w Gram Stain, Rflx to Resp Cult     Status: None   Collection Time: 01/05/23 10:49 AM   Specimen: Sputum  Result Value Ref Range Status   Specimen Description SPU  Final   Special Requests NONE  Final   Sputum evaluation   Final    THIS SPECIMEN IS ACCEPTABLE FOR SPUTUM CULTURE Performed at [facility], [address], [place], [place] [address]    Report Status 01/05/2023 FINAL  Final  Culture, Respiratory w Gram Stain     Status: None (Preliminary result)   Collection Time: 01/05/23 10:49 AM   Specimen: Sputum  Result Value Ref Range Status   Specimen Description   Final     SPU Performed at [facility], [address], [place], [place] [address]    Special Requests   Final    NONE Reflexed from [id number] Performed at [facility], [address] [place], [place] [address]    Gram Stain   Final    RARE SQUAMOUS EPITHELIAL CELLS PRESENT FEW WBC PRESENT, PREDOMINANTLY MONONUCLEAR NO ORGANISMS SEEN    Culture   Final    CULTURE REINCUBATED FOR BETTER GROWTH Performed at [facility], [address], [place], [place] [address]    Report Status PENDING  Incomplete     Radiology Studies: DG Abd 1 View  Result Date: 01/06/2023 CLINICAL DATA:  Generalized abdominal pain.  Constipation. EXAM: ABDOMEN - 1 VIEW COMPARISON:  CT 01/03/2023 FINDINGS: Bowel gas pattern is normal. Overall amount of fecal matter is normal. The patient does continue to have fecal impaction in the rectum. Arterial calcification is noted. Chronic degenerative change of the spine. Previous Knowles pins placement of the left hip. IMPRESSION: Normal bowel gas pattern other than fecal impaction in the rectum, similar to the prior CT. Electronically Signed   By: Paulina Fusi M.D.   On: 01/06/2023 14:12    Scheduled Meds:  albuterol  2.5 mg Nebulization Q6H   feeding supplement  237 mL Oral TID PC & HS   fluticasone furoate-vilanterol  1 puff Inhalation Daily   And   umeclidinium bromide  1 puff Inhalation Daily   heparin  5,000 Units Subcutaneous Q8H   metoprolol tartrate  50 mg Oral BID   multivitamin with minerals  1 tablet Oral Daily   pantoprazole  40 mg Oral Daily   polyethylene glycol  17 g Oral BID   saccharomyces boulardii  250 mg Oral BID   senna-docusate  2 tablet Oral QHS   Continuous Infusions:  piperacillin-tazobactam (ZOSYN)  IV 3.375 g (01/07/23 0514)     LOS: 4 days   Time spent: 54 mins  Cordarro Spinnato Laural Benes, MD How  to contact the [facility] Attending or Consulting provider 7A - 7P or covering provider during after hours 7P -7A, for this patient?  Check the care team in  [facility] and look for a) attending/consulting [facility] provider listed and b) the [facility] team listed Log into [email or web address] and use [facility]'s universal password to access. If you do not have the password, please contact the hospital operator. Locate the [facility] provider you are looking for under [facility] and page to a number that you can be directly reached. If you still have difficulty reaching the provider, please page the [facility] (Director on Call) for the Hospitalists listed on amion for assistance.  01/07/2023, 11:08 AM

## 2023-01-07 NOTE — Evaluation (Addendum)
Physical Therapy Evaluation Patient Details Name: Stephen Fitzpatrick MRN: [id number] DOB: 1945/07/29 Today's Date: 01/07/2023  History of Present Illness  77 y.o. male with medical history significant of carcinoma of the lung, CHF, COPD, GERD, anxiety, and more presents the ED with a chief complaint of dyspnea and hiccups that prevent him from sleeping.  Patient reports that he has had dyspnea and hiccups for 3-4 days.  His dyspnea is worse when laying flat.  He has a very weak cough but he reports that it is productive of white sputum.  He has had no hemoptysis.  Patient denies chest pain and palpitations.  He does report nausea that usually related to his cough.  He has not had any fevers.  Patient wears 4-5 L nasal cannula at home.  He denies any swelling in his legs.  He reports that he had decreased appetite for 4-5 days.  Patient also complains of dysuria for 4-5 days.  He reports it burns like somebody is taken 4 or 5 sticks of matches to him.  He has had no history of UTI in the past per his report.  Lastly on review of systems he complains of diarrhea.  He has been having diarrhea 2-3 times a day.  He denies any melena or hematochezia.  Patient has no other complaints at this time.   Clinical Impression  Pt admitted with above diagnosis. Patient supine in bed upon therapist arrival. Patient agreeable to participate in PT evaluation today. Patient on 2 LPM O2 throughout session with sats at 97% seated at edge of bed with heart rate 112 bpm. Patient modified independent with bed mobility requiring extra time. Patient required min assist to transfer sit to stand demonstrating labored movement and somewhat unsteady using RW. Demonstrated difficulty with power up with sit to stand transfer requiring increased time to complete movements. Cues for step sequencing and hand placement using. Patient ambulated demonstrating slow, labored cadence with RW demonstrating unsteadiness requiring contact guard assist  for safety and min assist for lead/line management in unfamiliar environment. Patient limited by fatigue. Patient agreeable to sitting up on chair at end of session - nursing notified. Pt currently with functional limitations due to the deficits listed below (see PT Problem List). Pt will benefit from acute skilled PT to increase their independence and safety with mobility to allow discharge.           If plan is discharge home, recommend the following: A little help with walking and/or transfers;A little help with bathing/dressing/bathroom;Help with stairs or ramp for entrance;Assistance with cooking/housework;Assist for transportation   Can travel by private vehicle        Equipment Recommendations None recommended by PT  Recommendations for Other Services       Functional Status Assessment Patient has had a recent decline in their functional status and demonstrates the ability to make significant improvements in function in a reasonable and predictable amount of time.     Precautions / Restrictions Precautions Precautions: Fall Restrictions Weight Bearing Restrictions: No      Mobility  Bed Mobility Overal bed mobility: Modified Independent             General bed mobility comments: extra time, labored movement    Transfers Overall transfer level: Needs assistance Equipment used: Rolling walker (2 wheels) Transfers: Sit to/from Stand, Bed to chair/wheelchair/BSC Sit to Stand: Contact guard assist, Min assist   Step pivot transfers: Contact guard assist   Anterior-Posterior transfers: Contact guard assist, Supervision  General transfer comment: labored movement, somewhat unsteady using RW, difficulty with power up with sit to stand transfer, increased time to complete movements; cues for step sequencing and hand placement using RW    Ambulation/Gait Ambulation/Gait assistance: Contact guard assist, Min assist Gait Distance (Feet): 4 Feet Assistive device:  Rolling walker (2 wheels) Gait Pattern/deviations: Step-through pattern Gait velocity: decreased     General Gait Details: slow, labored cadence with RW demonstrating unsteadiness requiring contact guard assist for safety and min assist for lead/line management in unfamiliar environment; limited by fatigue; on 2 LPM O2 throughout with 97% at rest and 91% upon sitting in chair; BP remained 110-115 bpm with activity  Stairs            Wheelchair Mobility     Tilt Bed    Modified Rankin (Stroke Patients Only)       Balance Overall balance assessment: Needs assistance Sitting-balance support: Bilateral upper extremity supported, Feet supported Sitting balance-Leahy Scale: Good Sitting balance - Comments: seated at edge of bed   Standing balance support: Bilateral upper extremity supported, During functional activity, Reliant on assistive device for balance Standing balance-Leahy Scale: Fair Standing balance comment: fair with RW         Pertinent Vitals/Pain Pain Assessment Pain Assessment: PAINAD Breathing: occasional labored breathing, short period of hyperventilation Negative Vocalization: occasional moan/groan, low speech, negative/disapproving quality Facial Expression: sad, frightened, frown Body Language: tense, distressed pacing, fidgeting Consolability: distracted or reassured by voice/touch PAINAD Score: 5 Pain Intervention(s): Limited activity within patient's tolerance, Monitored during session, Repositioned    Home Living Family/patient expects to be discharged to:: Private residence Living Arrangements: Children Available Help at Discharge: Family;Available 24 hours/day (son) Type of Home: Mobile home Home Access: Stairs to enter Entrance Stairs-Rails: Right;Left;Can reach both Entrance Stairs-Number of Steps: 6   Home Layout: One level Home Equipment: Agricultural consultant (2 wheels);Shower seat;Cane - single point;Grab bars - toilet;Grab bars -  tub/shower      Prior Function Prior Level of Function : Needs assist       Physical Assist : Mobility (physical);ADLs (physical)   ADLs (physical): IADLs;Bathing;Dressing Mobility Comments: household distances with RW       Extremity/Trunk Assessment   Upper Extremity Assessment Upper Extremity Assessment: Generalized weakness    Lower Extremity Assessment Lower Extremity Assessment: Generalized weakness    Cervical / Trunk Assessment Cervical / Trunk Assessment: Kyphotic  Communication   Communication Communication: Hearing impairment Cueing Techniques: Verbal cues;Gestural cues;Tactile cues  Cognition Arousal: Alert Behavior During Therapy: WFL for tasks assessed/performed Overall Cognitive Status: Within Functional Limits for tasks assessed        General Comments      Exercises     Assessment/Plan    PT Assessment Patient needs continued PT services  PT Problem List Decreased strength;Decreased knowledge of use of DME;Decreased activity tolerance;Decreased balance;Decreased mobility       PT Treatment Interventions DME instruction;Gait training;Stair training;Functional mobility training;Therapeutic activities;Therapeutic exercise;Balance training;Patient/family education    PT Goals (Current goals can be found in the Care Plan section)  Acute Rehab PT Goals Patient Stated Goal: Go home with assisstance from son. PT Goal Formulation: With patient Time For Goal Achievement: 01/21/23 Potential to Achieve Goals: Fair    Frequency Min 3X/week        AM-PAC PT "6 Clicks" Mobility  Outcome Measure Help needed turning from your back to your side while in a flat bed without using bedrails?: None Help needed moving from lying on your  back to sitting on the side of a flat bed without using bedrails?: A Little Help needed moving to and from a bed to a chair (including a wheelchair)?: A Little Help needed standing up from a chair using your arms (e.g.,  wheelchair or bedside chair)?: A Little Help needed to walk in hospital room?: A Little Help needed climbing 3-5 steps with a railing? : A Little 6 Click Score: 19    End of Session Equipment Utilized During Treatment: Gait belt;Oxygen Activity Tolerance: Patient limited by fatigue Patient left: in chair;with call bell/phone within reach;with chair alarm set Nurse Communication: Mobility status PT Visit Diagnosis: Unsteadiness on feet (R26.81);Other abnormalities of gait and mobility (R26.89);Difficulty in walking, not elsewhere classified (R26.2);Muscle weakness (generalized) (M62.81)    Time: 1020-1051 PT Time Calculation (min) (ACUTE ONLY): 31 min   Charges:   PT Evaluation $PT Eval Low Complexity: 1 Low PT Treatments $Therapeutic Activity: 8-22 mins PT General Charges $$ ACUTE PT VISIT: 1 Visit         Katina Dung. Hartnett-Rands, MS, PT [facility] PT [facility] [place] [phone]  Britta Mccreedy  Hartnett-Rands 01/07/2023, 11:08 AM

## 2023-01-07 NOTE — Plan of Care (Addendum)
  Problem: Acute Rehab PT Goals(only PT should resolve) Goal: Pt Will Go Supine/Side To Sit Outcome: Progressing Flowsheets (Taken 01/07/2023 1113) Pt will go Supine/Side to Sit: with supervision Goal: Pt Will Go Sit To Supine/Side Outcome: Progressing Flowsheets (Taken 01/07/2023 1113) Pt will go Sit to Supine/Side: with supervision Goal: Patient Will Transfer Sit To/From Stand Outcome: Progressing Flowsheets (Taken 01/07/2023 1113) Patient will transfer sit to/from stand: with contact guard assist Goal: Pt Will Transfer Bed To Chair/Chair To Bed Outcome: Progressing Flowsheets (Taken 01/07/2023 1113) Pt will Transfer Bed to Chair/Chair to Bed: with contact guard assist Goal: Pt Will Ambulate Outcome: Progressing Flowsheets (Taken 01/07/2023 1113) Pt will Ambulate:  25 feet  with contact guard assist  with rolling walker Goal: Pt Will Go Up/Down Stairs Outcome: Progressing Flowsheets (Taken 01/07/2023 1113) Pt will Go Up / Down Stairs:  3-5 stairs  with minimal assist  with rail(s)  with contact guard assist    Britta Mccreedy D. Hartnett-Rands, MS, PT [facility] PT [facility] [phone] 01/07/2023

## 2023-01-07 NOTE — Plan of Care (Signed)

## 2023-01-08 DIAGNOSIS — E43 Unspecified severe protein-calorie malnutrition: Secondary | ICD-10-CM | POA: Diagnosis not present

## 2023-01-08 DIAGNOSIS — A4150 Gram-negative sepsis, unspecified: Secondary | ICD-10-CM

## 2023-01-08 DIAGNOSIS — J449 Chronic obstructive pulmonary disease, unspecified: Secondary | ICD-10-CM | POA: Diagnosis not present

## 2023-01-08 DIAGNOSIS — N390 Urinary tract infection, site not specified: Secondary | ICD-10-CM

## 2023-01-08 DIAGNOSIS — J9611 Chronic respiratory failure with hypoxia: Secondary | ICD-10-CM | POA: Diagnosis not present

## 2023-01-08 LAB — CULTURE, BLOOD (ROUTINE X 2)
Culture: NO GROWTH
Culture: NO GROWTH

## 2023-01-08 LAB — CULTURE, RESPIRATORY W GRAM STAIN: Culture: NORMAL

## 2023-01-08 MED ORDER — ADULT MULTIVITAMIN W/MINERALS CH: 1.0000 | ORAL_TABLET | Freq: Every day | ORAL | Status: DC

## 2023-01-08 MED ORDER — POLYETHYLENE GLYCOL 3350 17 G PO PACK: 17.0000 g | PACK | Freq: Every day | ORAL | 2 refills | Status: DC

## 2023-01-08 MED ORDER — CIPROFLOXACIN HCL 250 MG PO TABS: 250.0000 mg | ORAL_TABLET | Freq: Two times a day (BID) | ORAL | 0 refills | Status: AC

## 2023-01-08 MED ORDER — BUDESONIDE 32 MCG/ACT NA SUSP: 1.0000 | Freq: Every day | NASAL | 1 refills | Status: DC

## 2023-01-08 MED ORDER — METOPROLOL TARTRATE 50 MG PO TABS: 50.0000 mg | ORAL_TABLET | Freq: Two times a day (BID) | ORAL | 1 refills | Status: DC

## 2023-01-08 MED ORDER — SACCHAROMYCES BOULARDII 250 MG PO CAPS: 250.0000 mg | ORAL_CAPSULE | Freq: Two times a day (BID) | ORAL | 0 refills | Status: AC

## 2023-01-08 NOTE — Progress Notes (Signed)
Mobility Specialist Progress Note:    01/08/23 1440  Mobility  Activity Refused mobility   Pt politely refused mobility, states "I was up all day yesterday". All needs met.   Lawerance Bach Mobility Specialist Please contact via Special educational needs teacher or  Rehab office at [phone]

## 2023-01-08 NOTE — TOC Transition Note (Addendum)
Transition of Care [facility]) - CM/SW Discharge Note   Patient Details  Name: Stephen Fitzpatrick MRN: [id number] Date of Birth: 12/12/1945  Transition of Care [facility]) CM/SW Contact:  Leitha Bleak, RN Phone Number: 01/08/2023, 10:00 AM   Clinical Narrative:   Discharging home. MD placed HHPT orders. Sarah with [facility] updated.  MD text, Son needs another day to work on living arrangements, DC being held.    Final next level of care: Home w Home Health Services Barriers to Discharge: Barriers Resolved   Patient Goals and CMS Choice CMS Medicare.gov Compare Post Acute Care list provided to:: Patient Represenative (must comment) Choice offered to / list presented to : Adult Children  Discharge Placement      Patient and family notified of of transfer: 01/08/23  Discharge Plan and Services Additional resources added to the After Visit Summary for        [facility] Arranged: PT   Date [facility] Agency Contacted: 01/07/23 Time HH Agency Contacted: 1337 Representative spoke with at [facility] Agency: Versie Starks  Social Determinants of Health (SDOH) Interventions SDOH Screenings   Food Insecurity: No Food Insecurity (01/04/2023)  Housing: Low Risk  (01/04/2023)  Transportation Needs: No Transportation Needs (01/04/2023)  Utilities: Not At Risk (01/04/2023)  Stress: No Stress Concern Present (12/06/2019)   Received from [facility], [facility]  Tobacco Use: Medium Risk (01/03/2023)  Health Literacy: Low Risk  (12/06/2019)   Received from [facility], [facility]    Readmission Risk Interventions    01/07/2023    1:35 PM 10/16/2021    2:06 PM  Readmission Risk Prevention Plan  Post Dischage Appt Not Complete   Medication Screening Complete   Transportation Screening Complete Complete  Home Care Screening  Complete  Medication Review (RN CM)  Complete

## 2023-01-08 NOTE — Discharge Summary (Addendum)
Physician Discharge Summary  GLYNDON RAILE ZOX:[id number] DOB: 1945-09-02 DOA: 01/03/2023  PCP: Rebekah Chesterfield, NP  Admit date: 01/03/2023 Discharge date: 01/08/2023  Admitted From: Home  Disposition: Home with [facility]  Recommendations for Outpatient Follow-up:  Follow up with PCP in 1-2 weeks Establish care with urologist in 2 weeks (amb referral made to urology [facility]) Please obtain BMP/CBC in 1-2 weeks to follow up  Please follow up on the following pending results: Final culture data   Home Health:  PT   Discharge Condition: STABLE   CODE STATUS: FULL DIET: high fiber diet recommended    Brief Hospitalization Summary: Please see all hospital notes, images, labs for full details of the hospitalization. Admission provider HPI:  77 y.o. male with medical history significant of carcinoma of the lung, CHF, COPD, GERD, anxiety, and more presents the ED with a chief complaint of dyspnea and hiccups that prevent him from sleeping.  Patient reports that he has had dyspnea and hiccups for 3-4 days.  His dyspnea is worse when laying flat.  He has a very weak cough but he reports that it is productive of white sputum.  He has had no hemoptysis.  Patient denies chest pain and palpitations.  He does report nausea that usually related to his cough.  He has not had any fevers.  Patient wears 4-5 L nasal cannula at home.  He denies any swelling in his legs.  He reports that he had decreased appetite for 4-5 days.  Patient also complains of dysuria for 4-5 days.  He reports it burns like somebody is taken 4 or 5 sticks of matches to him.  He has had no history of UTI in the past per his report.  Lastly on review of systems he complains of diarrhea.  He has been having diarrhea 2-3 times a day.  He denies any melena or hematochezia.  Patient has no other complaints at this time.   Hospital Course by problem list    Sepsis due to Pseudomonas UTI  - presented with tachycardia at 124, leukocytosis at  23.8 - Negative COVID - Blood cultures pending: no growth to date  - Urine culture suggesting gram negative infection - CT abdomen pelvis shows mild hydronephrosis and hydroureter on the right with possible underlying stricture, recommended urine culture due to changes that could be consistent with UTI - Chest x-ray shows possible retrocardiac infiltrate -Patient initially started on broad-spectrum antibiotics, vancomycin and Zosyn - Continued zosyn thru 9/30.  Cipro x 3d to eradicate pseudomonas if possible -- sepsis physiology resolved now   GERD (gastroesophageal reflux disease) - Continue Protonix   Mild Right Hydronephrosis - With changes consistent with UTI - Patient reports dysuria for 4-5 days - Continue broad-spectrum antibiotics - Urine culture positive for pseudomonas  - Blood culture No growth to date  - Follow up urology outpatient (ambulatory referral to urology made) - renal US consistent with mild right hydronephrosis, no left hydro seen   HTN (hypertension) - Continue metoprolol   Chronic Constipation with fecal impaction  - added laxatives  - dulcolax suppository ordered  - he has been disimpacted by RN - he is having bowel movements - miralax 17 gram daily ordered  - high fiber diet recommended   Nausea and Abdominal pain - secondary to constipation  - ordered KUB with findings of fecal impaction  - suppository ordered  - if not effective, enema ordered  - disimpacted by RN and patient feeling much better  - pt  having regular bowel movements now   Hiccoughs - thorazine PRN in hospital    Chronic respiratory failure with hypoxia  - Reports using 4-5 L nasal cannula at home - Currently on 2 L nasal cannula - Chronic respiratory failure secondary to COPD - See COPD plan   COPD (chronic obstructive pulmonary disease)  - DuoNeb given in the ED - Continue Trelegy and as needed albuterol   PNA (pneumonia) - treated with broad-spectrum antibiotics     Discharge Diagnoses:  Principal Problem:   SIRS (systemic inflammatory response syndrome) (HCC) Active Problems:   PNA (pneumonia)   COPD (chronic obstructive pulmonary disease) (HCC)   Chronic respiratory failure with hypoxia (HCC)   HTN (hypertension)   Hydronephrosis   GERD (gastroesophageal reflux disease)   Protein-calorie malnutrition, severe   Discharge Instructions: Discharge Instructions     Ambulatory referral to Urology   Complete by: As directed    Hospital follow up mild hydronephrosis      Allergies as of 01/08/2023   No Known Allergies      Medication List     STOP taking these medications    naproxen sodium 220 MG tablet Commonly known as: ALEVE       TAKE these medications    acetaminophen 325 MG tablet Commonly known as: TYLENOL Take 650 mg by mouth every 6 (six) hours as needed for mild pain.   albuterol 108 (90 Base) MCG/ACT inhaler Commonly known as: VENTOLIN HFA Inhale 2 puffs into the lungs every 6 (six) hours as needed for wheezing or shortness of breath.   albuterol (2.5 MG/3ML) 0.083% nebulizer solution Commonly known as: PROVENTIL Take 3 mLs (2.5 mg total) by nebulization every 6 (six) hours as needed for wheezing or shortness of breath.   budesonide 32 MCG/ACT nasal spray Commonly known as: Rhinocort Allergy Place 1 spray into both nostrils daily.   celecoxib 100 MG capsule Commonly known as: CELEBREX Take 100 mg by mouth daily.   ciprofloxacin 250 MG tablet Commonly known as: Cipro Take 1 tablet (250 mg total) by mouth 2 (two) times daily for 3 days.   docusate sodium 100 MG capsule Commonly known as: COLACE Take 100 mg by mouth daily as needed for mild constipation.   lisinopril-hydrochlorothiazide 10-12.5 MG tablet Commonly known as: ZESTORETIC Take 1 tablet by mouth daily.   metoprolol tartrate 50 MG tablet Commonly known as: LOPRESSOR Take 1 tablet (50 mg total) by mouth 2 (two) times daily.   multivitamin  with minerals Tabs tablet Take 1 tablet by mouth daily. Start taking on: January 09, 2023   omeprazole 40 MG capsule Commonly known as: PRILOSEC Take 40 mg by mouth every morning.   polyethylene glycol 17 g packet Commonly known as: MIRALAX / GLYCOLAX Take 17 g by mouth daily.   saccharomyces boulardii 250 MG capsule Commonly known as: FLORASTOR Take 1 capsule (250 mg total) by mouth 2 (two) times daily for 14 days.   traMADol 50 MG tablet Commonly known as: ULTRAM Take 50 mg by mouth 2 (two) times daily as needed.   Trelegy Ellipta 100-62.5-25 MCG/ACT Aepb Generic drug: Fluticasone-Umeclidin-Vilant Inhale 1 puff into the lungs daily.        Follow-up Information     [facility] Follow up.   Why: HHPT will call to schedule your first home visit.        Rebekah Chesterfield, NP. Schedule an appointment as soon as possible for a visit in 1 week(s).   Specialty:  Internal Medicine Why: Hospital Follow Up Contact information: [address] [place] [address] [phone]         Malen Gauze, MD. Schedule an appointment as soon as possible for a visit in 2 week(s).   Specialty: Urology Why: Hospital Follow Up Contact information: [address] [place] [address] [phone]                No Known Allergies Allergies as of 01/08/2023   No Known Allergies      Medication List     STOP taking these medications    naproxen sodium 220 MG tablet Commonly known as: ALEVE       TAKE these medications    acetaminophen 325 MG tablet Commonly known as: TYLENOL Take 650 mg by mouth every 6 (six) hours as needed for mild pain.   albuterol 108 (90 Base) MCG/ACT inhaler Commonly known as: VENTOLIN HFA Inhale 2 puffs into the lungs every 6 (six) hours as needed for wheezing or shortness of breath.   albuterol (2.5 MG/3ML) 0.083% nebulizer solution Commonly known as: PROVENTIL Take 3 mLs (2.5 mg total) by nebulization  every 6 (six) hours as needed for wheezing or shortness of breath.   budesonide 32 MCG/ACT nasal spray Commonly known as: Rhinocort Allergy Place 1 spray into both nostrils daily.   celecoxib 100 MG capsule Commonly known as: CELEBREX Take 100 mg by mouth daily.   ciprofloxacin 250 MG tablet Commonly known as: Cipro Take 1 tablet (250 mg total) by mouth 2 (two) times daily for 3 days.   docusate sodium 100 MG capsule Commonly known as: COLACE Take 100 mg by mouth daily as needed for mild constipation.   lisinopril-hydrochlorothiazide 10-12.5 MG tablet Commonly known as: ZESTORETIC Take 1 tablet by mouth daily.   metoprolol tartrate 50 MG tablet Commonly known as: LOPRESSOR Take 1 tablet (50 mg total) by mouth 2 (two) times daily.   multivitamin with minerals Tabs tablet Take 1 tablet by mouth daily. Start taking on: January 09, 2023   omeprazole 40 MG capsule Commonly known as: PRILOSEC Take 40 mg by mouth every morning.   polyethylene glycol 17 g packet Commonly known as: MIRALAX / GLYCOLAX Take 17 g by mouth daily.   saccharomyces boulardii 250 MG capsule Commonly known as: FLORASTOR Take 1 capsule (250 mg total) by mouth 2 (two) times daily for 14 days.   traMADol 50 MG tablet Commonly known as: ULTRAM Take 50 mg by mouth 2 (two) times daily as needed.   Trelegy Ellipta 100-62.5-25 MCG/ACT Aepb Generic drug: Fluticasone-Umeclidin-Vilant Inhale 1 puff into the lungs daily.        Procedures/Studies: DG Abd 1 View  Result Date: 01/06/2023 CLINICAL DATA:  Generalized abdominal pain.  Constipation. EXAM: ABDOMEN - 1 VIEW COMPARISON:  CT 01/03/2023 FINDINGS: Bowel gas pattern is normal. Overall amount of fecal matter is normal. The patient does continue to have fecal impaction in the rectum. Arterial calcification is noted. Chronic degenerative change of the spine. Previous Knowles pins placement of the left hip. IMPRESSION: Normal bowel gas pattern other than  fecal impaction in the rectum, similar to the prior CT. Electronically Signed   By: Paulina Fusi M.D.   On: 01/06/2023 14:12   US RENAL  Result Date: 01/04/2023 CLINICAL DATA:  Hydronephrosis EXAM: RENAL / URINARY TRACT ULTRASOUND COMPLETE COMPARISON:  None Available. FINDINGS: Right Kidney: Renal measurements: 12.3 x 5.3 x 5.9 cm = volume: 201 mL.  Echogenicity within normal limits. No mass. Mild right hydronephrosis. Left Kidney: Renal measurements: 11.2 x 5.7 x 6.1 cm = volume: 204 mL. Echogenicity within normal limits. No mass or hydronephrosis visualized. Bladder: Appears normal for degree of bladder distention. Other: None. IMPRESSION: 1. Mild right hydronephrosis similar to prior CT. No calculus or other obstructing etiology identified by ultrasound. 2. No left hydronephrosis. Electronically Signed   By: Jearld Lesch M.D.   On: 01/04/2023 14:00   DG CHEST PORT 1 VIEW  Result Date: 01/04/2023 CLINICAL DATA:  Dyspnea. EXAM: PORTABLE CHEST 1 VIEW COMPARISON:  Radiograph yesterday, radiograph 10/13/2021 FINDINGS: Advanced emphysema. Left lung volume loss is similar to yesterday's exam. Left peri/suprahilar opacities are chronic. Mild patchy opacity at the left lung base is similar prior. Left costophrenic angle not entirely included in the field of view. Normal heart size with stable mediastinal contours. IMPRESSION: 1. Advanced emphysema. Chronic left lung volume loss and perihilar opacity. 2. Mild patchy opacity at the left lung base is similar to yesterday's exam and is indeterminate in etiology. Electronically Signed   By: Narda Rutherford M.D.   On: 01/04/2023 09:43   CT ABDOMEN PELVIS W CONTRAST  Result Date: 01/03/2023 CLINICAL DATA:  Acute nonlocalized abdominal pain, nausea, vomiting EXAM: CT ABDOMEN AND PELVIS WITH CONTRAST TECHNIQUE: Multidetector CT imaging of the abdomen and pelvis was performed using the standard protocol following bolus administration of intravenous contrast. RADIATION  DOSE REDUCTION: This exam was performed according to the departmental dose-optimization program which includes automated exposure control, adjustment of the mA and/or kV according to patient size and/or use of iterative reconstruction technique. CONTRAST:  OMNIPAQUE IOHEXOL 300 MG/ML  SOLN COMPARISON:  None Available. FINDINGS: Lower chest: Emphysema. Left-sided volume loss. Subpleural nodular opacity is incompletely visualized at the right lung base, axial image # 1, possibly infectious or inflammatory but indeterminate. Cardiac size within normal limits. Small hiatal hernia. Hepatobiliary: Multiple cysts scattered throughout the liver. The liver is otherwise unremarkable. No intra or extrahepatic biliary ductal dilation. Gallbladder unremarkable Pancreas: Unremarkable Spleen: Unremarkable Adrenals/Urinary Tract: Mild right hydronephrosis and hydroureter to the level of the distal right ureter along the right pelvic sidewall where there is an abrupt caliber change. No extrinsic mass or intraluminal calcifications identified and an underlying stricture is suspected. There is motion artifact that limits evaluation of the lower pole the left kidney. However, there is urothelial enhancement involving the distal left ureter suggesting asymmetric inflammatory change as can be seen with a recently passed calculus or ascending urinary tract infection. No hydronephrosis on the left. No intrarenal or ureteral calculi. No perinephric fluid collections are seen. The bladder is unremarkable Stomach/Bowel: Large volume stool within the rectal vault. Severe descending and sigmoid colonic diverticulosis. Small right inguinal hernia contains a single loop of unremarkable distal small bowel. Stomach, small bowel, and large bowel are otherwise unremarkable. No evidence of obstruction or focal inflammation. No free intraperitoneal gas or fluid. Vascular/Lymphatic: Advanced aortoiliac atherosclerotic calcification. No aortic  aneurysm. No pathologic adenopathy within the abdomen and pelvis. Reproductive: Mild prostatic hypertrophy. Other: Small fat containing left inguinal hernia Musculoskeletal: Left hip ORIF has been performed. Osseous structures are diffusely osteopenic. Advanced degenerative changes are seen within the lumbar spine with ankylosis of L3-L5. IMPRESSION: 1. Mild right hydronephrosis and hydroureter to the level of the distal right ureter along the right pelvic sidewall where there is an abrupt caliber change. No extrinsic mass or intraluminal calcifications identified and an underlying stricture is suspected. Correlation with ureteroscopy may  be helpful for further evaluation. 2. Urothelial enhancement involving the distal left ureter suggesting asymmetric inflammatory change as can be seen with a recently passed calculus or ascending urinary tract infection. Correlation with urinalysis and urine culture may be helpful for further evaluation. 3. Large volume stool within the rectal vault. 4. Severe descending and sigmoid colonic diverticulosis without superimposed acute inflammatory change. 5. Small right inguinal hernia containing a single loop of unremarkable distal small bowel. No evidence of obstruction. Aortic Atherosclerosis (ICD10-I70.0) and Emphysema (ICD10-J43.9). Electronically Signed   By: Helyn Numbers M.D.   On: 01/03/2023 21:29   DG Chest Port 1 View  Result Date: 01/03/2023 CLINICAL DATA:  Sepsis EXAM: PORTABLE CHEST 1 VIEW COMPARISON:  None Available. FINDINGS: Emphysema with left apical bulla formation. Stable left-sided volume loss. Possible retrocardiac atelectasis or infiltrate, though this may be artifactual and related to patient rotation on this examination. No pneumothorax or pleural effusion. Cardiac size within normal limits. No acute bone abnormality. IMPRESSION: 1. Emphysema. 2. Possible retrocardiac atelectasis or infiltrate. A standard two view chest radiograph may be more helpful for  further evaluation. Electronically Signed   By: Helyn Numbers M.D.   On: 01/03/2023 21:17     Subjective: Pt reports feeling better, tolerating diet and having bowel movements.   Discharge Exam: Vitals:   01/08/23 0829 01/08/23 0835  BP:    Pulse:    Resp:    Temp:    SpO2: 98% 100%   Vitals:   01/07/23 2159 01/08/23 0405 01/08/23 0829 01/08/23 0835  BP: (!) 152/70 (!) 149/71    Pulse: 85 73    Resp: 18 20    Temp: 98.9 F (37.2 C) 97.9 F (36.6 C)    TempSrc:      SpO2: 100% 99% 98% 100%  Weight:      Height:       General exam: Appears calm and comfortable  Respiratory system: Clear to auscultation. Respiratory effort normal. Cardiovascular system: normal S1 & S2 heard. No JVD, murmurs, rubs, gallops or clicks. No pedal edema. Gastrointestinal system: Abdomen is nondistended, soft and nontender. No organomegaly or masses felt. Normal bowel sounds heard. Central nervous system: Alert and oriented. No focal neurological deficits. Extremities: Symmetric 5 x 5 power. Skin: No rashes, lesions or ulcers. Psychiatry: Judgement and insight appear normal. Mood & affect appropriate.    The results of significant diagnostics from this hospitalization (including imaging, microbiology, ancillary and laboratory) are listed below for reference.     Microbiology: Recent Results (from the past 240 hour(s))  Blood Culture (routine x 2)     Status: None   Collection Time: 01/03/23  6:45 PM   Specimen: Right Antecubital; Blood  Result Value Ref Range Status   Specimen Description RIGHT ANTECUBITAL  Final   Special Requests   Final    BOTTLES DRAWN AEROBIC AND ANAEROBIC Blood Culture results may not be optimal due to an inadequate volume of blood received in culture bottles   Culture   Final    NO GROWTH 5 DAYS Performed at [facility], [address], [place], [place] [address]    Report Status 01/08/2023 FINAL  Final  Blood Culture (routine x 2)     Status: None   Collection  Time: 01/03/23  6:50 PM   Specimen: Right Antecubital; Blood  Result Value Ref Range Status   Specimen Description RIGHT ANTECUBITAL  Final   Special Requests   Final    BOTTLES DRAWN AEROBIC AND ANAEROBIC Blood  Culture results may not be optimal due to an excessive volume of blood received in culture bottles   Culture   Final    NO GROWTH 5 DAYS Performed at [facility], [address], [place], [place] [address]    Report Status 01/08/2023 FINAL  Final  Resp panel by RT-PCR (RSV, Flu A&B, Covid) Anterior Nasal Swab     Status: None   Collection Time: 01/03/23  7:22 PM   Specimen: Anterior Nasal Swab  Result Value Ref Range Status   SARS Coronavirus 2 by RT PCR NEGATIVE NEGATIVE Final    Comment: (NOTE) SARS-CoV-2 target nucleic acids are NOT DETECTED.  The SARS-CoV-2 RNA is generally detectable in upper respiratory specimens during the acute phase of infection. The lowest concentration of SARS-CoV-2 viral copies this assay can detect is 138 copies/mL. A negative result does not preclude SARS-Cov-2 infection and should not be used as the sole basis for treatment or other patient management decisions. A negative result may occur with  improper specimen collection/handling, submission of specimen other than nasopharyngeal swab, presence of viral mutation(s) within the areas targeted by this assay, and inadequate number of viral copies(<138 copies/mL). A negative result must be combined with clinical observations, patient history, and epidemiological information. The expected result is Negative.  Fact Sheet for Patients:  BloggerCourse.com  Fact Sheet for Healthcare Providers:  SeriousBroker.it  This test is no t yet approved or cleared by the [place] FDA and  has been authorized for detection and/or diagnosis of SARS-CoV-2 by FDA under an Emergency Use Authorization (EUA). This EUA will remain  in effect (meaning this test  can be used) for the duration of the COVID-19 declaration under Section 564(b)(1) of the Act, 21 U.S.C.section 360bbb-3(b)(1), unless the authorization is terminated  or revoked sooner.       Influenza A by PCR NEGATIVE NEGATIVE Final   Influenza B by PCR NEGATIVE NEGATIVE Final    Comment: (NOTE) The Xpert Xpress SARS-CoV-2/FLU/RSV plus assay is intended as an aid in the diagnosis of influenza from Nasopharyngeal swab specimens and should not be used as a sole basis for treatment. Nasal washings and aspirates are unacceptable for Xpert Xpress SARS-CoV-2/FLU/RSV testing.  Fact Sheet for Patients: BloggerCourse.com  Fact Sheet for Healthcare Providers: SeriousBroker.it  This test is not yet approved or cleared by the [place] FDA and has been authorized for detection and/or diagnosis of SARS-CoV-2 by FDA under an Emergency Use Authorization (EUA). This EUA will remain in effect (meaning this test can be used) for the duration of the COVID-19 declaration under Section 564(b)(1) of the Act, 21 U.S.C. section 360bbb-3(b)(1), unless the authorization is terminated or revoked.     Resp Syncytial Virus by PCR NEGATIVE NEGATIVE Final    Comment: (NOTE) Fact Sheet for Patients: BloggerCourse.com  Fact Sheet for Healthcare Providers: SeriousBroker.it  This test is not yet approved or cleared by the [place] FDA and has been authorized for detection and/or diagnosis of SARS-CoV-2 by FDA under an Emergency Use Authorization (EUA). This EUA will remain in effect (meaning this test can be used) for the duration of the COVID-19 declaration under Section 564(b)(1) of the Act, 21 U.S.C. section 360bbb-3(b)(1), unless the authorization is terminated or revoked.  Performed at [facility], [address] [place], [place] [address]   Urine Culture     Status: Abnormal   Collection  Time: 01/04/23 12:50 AM   Specimen: Urine, Random  Result Value Ref Range Status   Specimen Description  Final    URINE, RANDOM Performed at [facility], [address], [place], [place] [address]    Special Requests   Final    NONE Reflexed from [id number] Performed at [facility], [address], [place], [place] [address]    Culture 20,000 COLONIES/mL PSEUDOMONAS AERUGINOSA (A)  Final   Report Status 01/06/2023 FINAL  Final   Organism ID, Bacteria PSEUDOMONAS AERUGINOSA (A)  Final      Susceptibility   Pseudomonas aeruginosa - MIC*    CEFTAZIDIME 2 SENSITIVE Sensitive     CIPROFLOXACIN <=0.25 SENSITIVE Sensitive     GENTAMICIN <=1 SENSITIVE Sensitive     IMIPENEM 1 SENSITIVE Sensitive     PIP/TAZO 8 SENSITIVE Sensitive     CEFEPIME 2 SENSITIVE Sensitive     * 20,000 COLONIES/mL PSEUDOMONAS AERUGINOSA  MRSA Next Gen by PCR, Nasal     Status: None   Collection Time: 01/05/23  8:00 AM   Specimen: Nasal Mucosa; Nasal Swab  Result Value Ref Range Status   MRSA by PCR Next Gen NOT DETECTED NOT DETECTED Final    Comment: (NOTE) The GeneXpert MRSA Assay (FDA approved for NASAL specimens only), is one component of a comprehensive MRSA colonization surveillance program. It is not intended to diagnose MRSA infection nor to guide or monitor treatment for MRSA infections. Test performance is not FDA approved in patients less than 62 years old. Performed at [facility], [address] [place], [place] [address]   Expectorated Sputum Assessment w Gram Stain, Rflx to Resp Cult     Status: None   Collection Time: 01/05/23 10:49 AM   Specimen: Sputum  Result Value Ref Range Status   Specimen Description SPU  Final   Special Requests NONE  Final   Sputum evaluation   Final    THIS SPECIMEN IS ACCEPTABLE FOR SPUTUM CULTURE Performed at [facility], [address] [place], [place] [address]    Report Status 01/05/2023 FINAL  Final  Culture, Respiratory w Gram Stain     Status: None    Collection Time: 01/05/23 10:49 AM   Specimen: Sputum  Result Value Ref Range Status   Specimen Description   Final    SPU Performed at [facility], [address] [place], [place] [address]    Special Requests   Final    NONE Reflexed from [id number] Performed at [facility], [address] [place], [place] [address]    Gram Stain   Final    RARE SQUAMOUS EPITHELIAL CELLS PRESENT FEW WBC PRESENT, PREDOMINANTLY MONONUCLEAR NO ORGANISMS SEEN    Culture   Final    RARE Normal respiratory flora-no Staph aureus or Pseudomonas seen Performed at [facility], [address] [place], [place] [address]    Report Status 01/08/2023 FINAL  Final     Labs: BNP (last 3 results) No results for input(s): "BNP" in the last 8760 hours. Basic Metabolic Panel: Recent Labs  Lab 01/03/23 1839 01/04/23 0433 01/06/23 0725 01/07/23 0503  NA 138 137 135 136  K 3.9 3.6 3.7 3.9  CL 99 100 100 99  CO2 29 26 27 29   GLUCOSE 124* 102* 109* 114*  BUN 44* 37* 27* 22  CREATININE 1.17 1.11 1.23 1.17  CALCIUM 7.9* 7.5* 7.3* 7.6*  MG  --  2.3  --   --    Liver Function Tests: Recent Labs  Lab 01/03/23 1839 01/04/23 0433 01/07/23 0503  AST 17 15 21   ALT 13 12 14  ALKPHOS 69 59 49  BILITOT 0.7 0.6 0.4  PROT 6.0* 5.0* 5.7*  ALBUMIN 2.7* 2.2* 2.3*   Recent Labs  Lab 01/03/23 1839  LIPASE 43   No results for input(s): "AMMONIA" in the last 168 hours. CBC: Recent Labs  Lab 01/03/23 1839 01/04/23 0433 01/05/23 0818 01/06/23 0725 01/07/23 0503  WBC 23.8* 27.5* 20.9* 18.3* 18.1*  NEUTROABS 18.8* 22.9* 17.5* 15.8* 15.8*  HGB 12.6* 10.5* 9.8* 9.6* 9.6*  HCT 40.7 34.2* 32.3* 30.6* 32.4*  MCV 80.8 80.9 82.2 81.4 83.3  PLT 229 209 224 252 303   Cardiac Enzymes: No results for input(s): "CKTOTAL", "CKMB", "CKMBINDEX", "TROPONINI" in the last 168 hours. BNP: Invalid input(s): "POCBNP" CBG: No results for input(s): "GLUCAP" in the last 168 hours. D-Dimer No results for input(s):  "DDIMER" in the last 72 hours. Hgb A1c No results for input(s): "HGBA1C" in the last 72 hours. Lipid Profile No results for input(s): "CHOL", "HDL", "LDLCALC", "TRIG", "CHOLHDL", "LDLDIRECT" in the last 72 hours. Thyroid function studies No results for input(s): "TSH", "T4TOTAL", "T3FREE", "THYROIDAB" in the last 72 hours.  Invalid input(s): "FREET3" Anemia work up No results for input(s): "VITAMINB12", "FOLATE", "FERRITIN", "TIBC", "IRON", "RETICCTPCT" in the last 72 hours. Urinalysis    Component Value Date/Time   COLORURINE YELLOW 01/04/2023 0050   APPEARANCEUR HAZY (A) 01/04/2023 0050   LABSPEC 1.043 (H) 01/04/2023 0050   PHURINE 7.0 01/04/2023 0050   GLUCOSEU NEGATIVE 01/04/2023 0050   HGBUR LARGE (A) 01/04/2023 0050   BILIRUBINUR NEGATIVE 01/04/2023 0050   KETONESUR NEGATIVE 01/04/2023 0050   PROTEINUR 30 (A) 01/04/2023 0050   UROBILINOGEN 1.0 02/21/2012 1644   NITRITE NEGATIVE 01/04/2023 0050   LEUKOCYTESUR LARGE (A) 01/04/2023 0050   Sepsis Labs Recent Labs  Lab 01/04/23 0433 01/05/23 0818 01/06/23 0725 01/07/23 0503  WBC 27.5* 20.9* 18.3* 18.1*   Microbiology Recent Results (from the past 240 hour(s))  Blood Culture (routine x 2)     Status: None   Collection Time: 01/03/23  6:45 PM   Specimen: Right Antecubital; Blood  Result Value Ref Range Status   Specimen Description RIGHT ANTECUBITAL  Final   Special Requests   Final    BOTTLES DRAWN AEROBIC AND ANAEROBIC Blood Culture results may not be optimal due to an inadequate volume of blood received in culture bottles   Culture   Final    NO GROWTH 5 DAYS Performed at [facility], [address], [place], [place] [address]    Report Status 01/08/2023 FINAL  Final  Blood Culture (routine x 2)     Status: None   Collection Time: 01/03/23  6:50 PM   Specimen: Right Antecubital; Blood  Result Value Ref Range Status   Specimen Description RIGHT ANTECUBITAL  Final   Special Requests   Final    BOTTLES DRAWN  AEROBIC AND ANAEROBIC Blood Culture results may not be optimal due to an excessive volume of blood received in culture bottles   Culture   Final    NO GROWTH 5 DAYS Performed at [facility], [address], [place], [place] [address]    Report Status 01/08/2023 FINAL  Final  Resp panel by RT-PCR (RSV, Flu A&B, Covid) Anterior Nasal Swab     Status: None   Collection Time: 01/03/23  7:22 PM   Specimen: Anterior Nasal Swab  Result Value Ref Range Status   SARS Coronavirus 2 by RT PCR NEGATIVE NEGATIVE Final    Comment: (NOTE) SARS-CoV-2 target nucleic acids are NOT DETECTED.  The SARS-CoV-2 RNA is generally detectable in upper respiratory specimens during the acute phase of infection. The lowest concentration of SARS-CoV-2 viral copies this assay can detect is 138 copies/mL. A negative result does not preclude SARS-Cov-2 infection and should not be used as the sole basis for treatment or other patient management decisions. A negative result may occur with  improper specimen collection/handling, submission of specimen other than nasopharyngeal swab, presence of viral mutation(s) within the areas targeted by this assay, and inadequate number of viral copies(<138 copies/mL). A negative result must be combined with clinical observations, patient history, and epidemiological information. The expected result is Negative.  Fact Sheet for Patients:  BloggerCourse.com  Fact Sheet for Healthcare Providers:  SeriousBroker.it  This test is no t yet approved or cleared by the [place] FDA and  has been authorized for detection and/or diagnosis of SARS-CoV-2 by FDA under an Emergency Use Authorization (EUA). This EUA will remain  in effect (meaning this test can be used) for the duration of the COVID-19 declaration under Section 564(b)(1) of the Act, 21 U.S.C.section 360bbb-3(b)(1), unless the authorization is terminated  or revoked sooner.        Influenza A by PCR NEGATIVE NEGATIVE Final   Influenza B by PCR NEGATIVE NEGATIVE Final    Comment: (NOTE) The Xpert Xpress SARS-CoV-2/FLU/RSV plus assay is intended as an aid in the diagnosis of influenza from Nasopharyngeal swab specimens and should not be used as a sole basis for treatment. Nasal washings and aspirates are unacceptable for Xpert Xpress SARS-CoV-2/FLU/RSV testing.  Fact Sheet for Patients: BloggerCourse.com  Fact Sheet for Healthcare Providers: SeriousBroker.it  This test is not yet approved or cleared by the [place] FDA and has been authorized for detection and/or diagnosis of SARS-CoV-2 by FDA under an Emergency Use Authorization (EUA). This EUA will remain in effect (meaning this test can be used) for the duration of the COVID-19 declaration under Section 564(b)(1) of the Act, 21 U.S.C. section 360bbb-3(b)(1), unless the authorization is terminated or revoked.     Resp Syncytial Virus by PCR NEGATIVE NEGATIVE Final    Comment: (NOTE) Fact Sheet for Patients: BloggerCourse.com  Fact Sheet for Healthcare Providers: SeriousBroker.it  This test is not yet approved or cleared by the [place] FDA and has been authorized for detection and/or diagnosis of SARS-CoV-2 by FDA under an Emergency Use Authorization (EUA). This EUA will remain in effect (meaning this test can be used) for the duration of the COVID-19 declaration under Section 564(b)(1) of the Act, 21 U.S.C. section 360bbb-3(b)(1), unless the authorization is terminated or revoked.  Performed at [facility], [address] [place], [place] [address]   Urine Culture     Status: Abnormal   Collection Time: 01/04/23 12:50 AM   Specimen: Urine, Random  Result Value Ref Range Status   Specimen Description   Final    URINE, RANDOM Performed at [facility], [address],  [place], [place] [address]    Special Requests   Final    NONE Reflexed from [id number] Performed at [facility], [address], [place], [place] [address]    Culture 20,000 COLONIES/mL PSEUDOMONAS AERUGINOSA (A)  Final   Report Status 01/06/2023 FINAL  Final   Organism ID, Bacteria PSEUDOMONAS AERUGINOSA (A)  Final      Susceptibility   Pseudomonas aeruginosa - MIC*    CEFTAZIDIME 2 SENSITIVE Sensitive     CIPROFLOXACIN <=0.25 SENSITIVE Sensitive     GENTAMICIN <=1 SENSITIVE Sensitive  IMIPENEM 1 SENSITIVE Sensitive     PIP/TAZO 8 SENSITIVE Sensitive     CEFEPIME 2 SENSITIVE Sensitive     * 20,000 COLONIES/mL PSEUDOMONAS AERUGINOSA  MRSA Next Gen by PCR, Nasal     Status: None   Collection Time: 01/05/23  8:00 AM   Specimen: Nasal Mucosa; Nasal Swab  Result Value Ref Range Status   MRSA by PCR Next Gen NOT DETECTED NOT DETECTED Final    Comment: (NOTE) The GeneXpert MRSA Assay (FDA approved for NASAL specimens only), is one component of a comprehensive MRSA colonization surveillance program. It is not intended to diagnose MRSA infection nor to guide or monitor treatment for MRSA infections. Test performance is not FDA approved in patients less than 86 years old. Performed at [facility], [address] [place], [place] [address]   Expectorated Sputum Assessment w Gram Stain, Rflx to Resp Cult     Status: None   Collection Time: 01/05/23 10:49 AM   Specimen: Sputum  Result Value Ref Range Status   Specimen Description SPU  Final   Special Requests NONE  Final   Sputum evaluation   Final    THIS SPECIMEN IS ACCEPTABLE FOR SPUTUM CULTURE Performed at [facility] [address], [place], [place] [address]    Report Status 01/05/2023 FINAL  Final  Culture, Respiratory w Gram Stain     Status: None   Collection Time: 01/05/23 10:49 AM   Specimen: Sputum  Result Value Ref Range Status   Specimen Description   Final    SPU Performed at [facility], [address]  [place], [place] [address]    Special Requests   Final    NONE Reflexed from [id number] Performed at [facility], [address], [place], [place] [address]    Gram Stain   Final    RARE SQUAMOUS EPITHELIAL CELLS PRESENT FEW WBC PRESENT, PREDOMINANTLY MONONUCLEAR NO ORGANISMS SEEN    Culture   Final    RARE Normal respiratory flora-no Staph aureus or Pseudomonas seen Performed at [facility], [address] [place], [place] [address]    Report Status 01/08/2023 FINAL  Final   Time coordinating discharge:  42 mins  SIGNED:  Standley Dakins, MD  [facility] 01/08/2023, 12:16 PM How to contact the [facility] Attending or Consulting provider 7A - 7P or covering provider during after hours 7P -7A, for this patient?  Check the care team in [facility] and look for a) attending/consulting [facility] provider listed and b) the [facility] team listed Log into [email or web address] and use [facility] universal password to access. If you do not have the password, please contact the hospital operator. Locate the [facility] provider you are looking for under [facility] and page to a number that you can be directly reached. If you still have difficulty reaching the provider, please page the [facility] (Director on Call) for the Hospitalists listed on amion for assistance.

## 2023-01-08 NOTE — Plan of Care (Signed)

## 2023-01-08 NOTE — Care Management Important Message (Signed)
Important Message  Patient Details  Name: Stephen Fitzpatrick MRN: [id number] Date of Birth: Jun 12, 1945   Important Message Given:  Yes - Medicare IM     Corey Harold 01/08/2023, 12:16 PM

## 2023-01-08 NOTE — Discharge Instructions (Signed)
IMPORTANT INFORMATION: PAY CLOSE ATTENTION  ° °PHYSICIAN DISCHARGE INSTRUCTIONS ° °Follow with Primary care provider  Dickey, Kirkland M, NP  and other consultants as instructed by your Hospitalist Physician ° °SEEK MEDICAL CARE OR RETURN TO EMERGENCY ROOM IF SYMPTOMS COME BACK, WORSEN OR NEW PROBLEM DEVELOPS  ° °Please note: °You were cared for by a hospitalist during your hospital stay. Every effort will be made to forward records to your primary care provider.  You can request that your primary care provider send for your hospital records if they have not received them.  Once you are discharged, your primary care physician will handle any further medical issues. Please note that NO REFILLS for any discharge medications will be authorized once you are discharged, as it is imperative that you return to your primary care physician (or establish a relationship with a primary care physician if you do not have one) for your post hospital discharge needs so that they can reassess your need for medications and monitor your lab values. ° °Please get a complete blood count and chemistry panel checked by your Primary MD at your next visit, and again as instructed by your Primary MD. ° °Get Medicines reviewed and adjusted: °Please take all your medications with you for your next visit with your Primary MD ° °Laboratory/radiological data: °Please request your Primary MD to go over all hospital tests and procedure/radiological results at the follow up, please ask your primary care provider to get all Hospital records sent to his/her office. ° °In some cases, they will be blood work, cultures and biopsy results pending at the time of your discharge. Please request that your primary care provider follow up on these results. ° °If you are diabetic, please bring your blood sugar readings with you to your follow up appointment with primary care.   ° °Please call and make your follow up appointments as soon as possible.   ° °Also  Note the following: °If you experience worsening of your admission symptoms, develop shortness of breath, life threatening emergency, suicidal or homicidal thoughts you must seek medical attention immediately by calling 911 or calling your MD immediately  if symptoms less severe. ° °You must read complete instructions/literature along with all the possible adverse reactions/side effects for all the Medicines you take and that have been prescribed to you. Take any new Medicines after you have completely understood and accpet all the possible adverse reactions/side effects.  ° °Do not drive when taking Pain medications or sleeping medications (Benzodiazepines) ° °Do not take more than prescribed Pain, Sleep and Anxiety Medications. It is not advisable to combine anxiety,sleep and pain medications without talking with your primary care practitioner ° °Special Instructions: If you have smoked or chewed Tobacco  in the last 2 yrs please stop smoking, stop any regular Alcohol  and or any Recreational drug use. ° °Wear Seat belts while driving.  Do not drive if taking any narcotic, mind altering or controlled substances or recreational drugs or alcohol.  ° ° ° ° ° °

## 2023-01-08 NOTE — Progress Notes (Signed)
   01/08/23 1200  Spiritual Encounters  Type of Visit Initial  Care provided to: Patient  Referral source Nurse (RN/NT/LPN)  Reason for visit Routine spiritual support  OnCall Visit No   The chaplain visited with the patient, Stephen Fitzpatrick, during rounding on the unit. He was open to talking with me and shared some of his story. Jerimy expressed frustration due to his illness.  "I just wish I could shake the sickness. It just won't stop. He is grateful for his family and support they provide.   Valerie Roys [facility]  [phone]

## 2023-01-08 NOTE — Plan of Care (Signed)
  Problem: Education: Goal: Knowledge of General Education information will improve Description: Including pain rating scale, medication(s)/side effects and non-pharmacologic comfort measures Outcome: Progressing   Problem: Health Behavior/Discharge Planning: Goal: Ability to manage health-related needs will improve Outcome: Progressing   Problem: Clinical Measurements: Goal: Ability to maintain clinical measurements within normal limits will improve Outcome: Progressing   Problem: Activity: Goal: Risk for activity intolerance will decrease Outcome: Progressing   Problem: Nutrition: Goal: Adequate nutrition will be maintained Outcome: Progressing   Problem: Coping: Goal: Level of anxiety will decrease Outcome: Progressing   Problem: Pain Managment: Goal: General experience of comfort will improve Outcome: Progressing   Problem: Safety: Goal: Ability to remain free from injury will improve Outcome: Progressing   Problem: Skin Integrity: Goal: Risk for impaired skin integrity will decrease Outcome: Progressing   Problem: Activity: Goal: Ability to tolerate increased activity will improve Outcome: Progressing

## 2023-01-08 NOTE — Plan of Care (Signed)
  Problem: Activity: Goal: Risk for activity intolerance will decrease Outcome: Progressing   Problem: Coping: Goal: Level of anxiety will decrease Outcome: Progressing   

## 2023-01-09 ENCOUNTER — Inpatient Hospital Stay (HOSPITAL_COMMUNITY): Payer: Medicare HMO

## 2023-01-09 MED ORDER — ALBUTEROL SULFATE (2.5 MG/3ML) 0.083% IN NEBU
2.5000 mg | INHALATION_SOLUTION | Freq: Four times a day (QID) | RESPIRATORY_TRACT | Status: DC | PRN
  Administered 2023-01-09: 2.5 mg via RESPIRATORY_TRACT
  Filled 2023-01-09: qty 3

## 2023-01-09 MED ORDER — ONDANSETRON HCL 4 MG PO TABS: 4.0000 mg | ORAL_TABLET | Freq: Every day | ORAL | 1 refills | Status: DC | PRN

## 2023-01-09 NOTE — Progress Notes (Signed)
Patient seen and evaluated today with son at bedside.  Patient states that overall he does not feel well and has been losing weight over a long period of time and has been getting increasingly weak.  He states that he feels that his health is failing him and son is concerned about how well he will do at home.  I have discussed the fact that he has received maximum medical benefit during the course of this hospitalization and that he should continue on antibiotics as recommended on discharge summary dictated 10/1.  He would also benefit from some Zofran at home for nausea or vomiting since he has had some episodes here and will also be set up with ambulatory palliative care consultation to help discuss goals of care outpatient.  Chest x-ray reviewed with no new findings noted.  He remains a high risk for readmission.  Please refer to discharge summary dictated 10/1 for full details.  Total care time: 30 minutes.

## 2023-03-13 ENCOUNTER — Ambulatory Visit: Payer: Medicare HMO | Admitting: Urology

## 2023-03-15 ENCOUNTER — Other Ambulatory Visit: Payer: Self-pay

## 2023-03-15 ENCOUNTER — Emergency Department (HOSPITAL_COMMUNITY): Payer: Medicare HMO

## 2023-03-15 ENCOUNTER — Inpatient Hospital Stay (HOSPITAL_COMMUNITY)
Admission: EM | Admit: 2023-03-15 | Discharge: 2023-03-18 | DRG: 682 | Disposition: A | Discharge status: Skilled Nursing Facility | Payer: Medicare HMO | Source: Skilled Nursing Facility | Attending: Internal Medicine | Admitting: Internal Medicine

## 2023-03-15 ENCOUNTER — Encounter (HOSPITAL_COMMUNITY): Payer: Self-pay

## 2023-03-15 DIAGNOSIS — R112 Nausea with vomiting, unspecified: Secondary | ICD-10-CM | POA: Diagnosis present

## 2023-03-15 DIAGNOSIS — R911 Solitary pulmonary nodule: Secondary | ICD-10-CM | POA: Diagnosis not present

## 2023-03-15 DIAGNOSIS — Z79899 Other long term (current) drug therapy: Secondary | ICD-10-CM

## 2023-03-15 DIAGNOSIS — K5909 Other constipation: Secondary | ICD-10-CM | POA: Diagnosis present

## 2023-03-15 DIAGNOSIS — N179 Acute kidney failure, unspecified: Principal | ICD-10-CM | POA: Diagnosis present

## 2023-03-15 DIAGNOSIS — C3490 Malignant neoplasm of unspecified part of unspecified bronchus or lung: Secondary | ICD-10-CM | POA: Diagnosis present

## 2023-03-15 DIAGNOSIS — Z8673 Personal history of transient ischemic attack (TIA), and cerebral infarction without residual deficits: Secondary | ICD-10-CM | POA: Diagnosis not present

## 2023-03-15 DIAGNOSIS — Z681 Body mass index (BMI) 19 or less, adult: Secondary | ICD-10-CM | POA: Diagnosis not present

## 2023-03-15 DIAGNOSIS — C3432 Malignant neoplasm of lower lobe, left bronchus or lung: Secondary | ICD-10-CM | POA: Diagnosis present

## 2023-03-15 DIAGNOSIS — Z1152 Encounter for screening for COVID-19: Secondary | ICD-10-CM

## 2023-03-15 DIAGNOSIS — K219 Gastro-esophageal reflux disease without esophagitis: Secondary | ICD-10-CM | POA: Diagnosis present

## 2023-03-15 DIAGNOSIS — D649 Anemia, unspecified: Secondary | ICD-10-CM | POA: Diagnosis present

## 2023-03-15 DIAGNOSIS — J9611 Chronic respiratory failure with hypoxia: Secondary | ICD-10-CM | POA: Diagnosis present

## 2023-03-15 DIAGNOSIS — Z87891 Personal history of nicotine dependence: Secondary | ICD-10-CM

## 2023-03-15 DIAGNOSIS — E43 Unspecified severe protein-calorie malnutrition: Secondary | ICD-10-CM | POA: Diagnosis present

## 2023-03-15 DIAGNOSIS — R64 Cachexia: Secondary | ICD-10-CM | POA: Diagnosis present

## 2023-03-15 DIAGNOSIS — R32 Unspecified urinary incontinence: Secondary | ICD-10-CM | POA: Diagnosis present

## 2023-03-15 DIAGNOSIS — Z9981 Dependence on supplemental oxygen: Secondary | ICD-10-CM

## 2023-03-15 DIAGNOSIS — E861 Hypovolemia: Secondary | ICD-10-CM | POA: Diagnosis present

## 2023-03-15 DIAGNOSIS — J449 Chronic obstructive pulmonary disease, unspecified: Secondary | ICD-10-CM | POA: Diagnosis present

## 2023-03-15 DIAGNOSIS — R627 Adult failure to thrive: Secondary | ICD-10-CM | POA: Diagnosis present

## 2023-03-15 DIAGNOSIS — I10 Essential (primary) hypertension: Secondary | ICD-10-CM | POA: Diagnosis present

## 2023-03-15 LAB — COMPREHENSIVE METABOLIC PANEL
ALT: 13 U/L (ref 0–44)
AST: 19 U/L (ref 15–41)
Albumin: 3 g/dL — ABNORMAL LOW (ref 3.5–5.0)
Alkaline Phosphatase: 54 U/L (ref 38–126)
Anion gap: 7 (ref 5–15)
BUN: 61 mg/dL — ABNORMAL HIGH (ref 8–23)
CO2: 23 mmol/L (ref 22–32)
Calcium: 8.6 mg/dL — ABNORMAL LOW (ref 8.9–10.3)
Chloride: 107 mmol/L (ref 98–111)
Creatinine, Ser: 3.15 mg/dL — ABNORMAL HIGH (ref 0.61–1.24)
GFR, Estimated: 19 mL/min — ABNORMAL LOW (ref >60)
Glucose, Bld: 84 mg/dL (ref 70–99)
Potassium: 4.9 mmol/L (ref 3.5–5.1)
Sodium: 137 mmol/L (ref 135–145)
Total Bilirubin: 0.4 mg/dL (ref <1.2)
Total Protein: 6.3 g/dL — ABNORMAL LOW (ref 6.5–8.1)

## 2023-03-15 LAB — CBC WITH DIFFERENTIAL/PLATELET
Abs Immature Granulocytes: 0.13 10*3/uL — ABNORMAL HIGH (ref 0.00–0.07)
Basophils Absolute: 0.1 10*3/uL (ref 0.0–0.1)
Basophils Relative: 1 %
Eosinophils Absolute: 0.1 10*3/uL (ref 0.0–0.5)
Eosinophils Relative: 1 %
HCT: 29 % — ABNORMAL LOW (ref 39.0–52.0)
Hemoglobin: 8.7 g/dL — ABNORMAL LOW (ref 13.0–17.0)
Immature Granulocytes: 1 %
Lymphocytes Relative: 11 %
Lymphs Abs: 1.3 10*3/uL (ref 0.7–4.0)
MCH: 25.8 pg — ABNORMAL LOW (ref 26.0–34.0)
MCHC: 30 g/dL (ref 30.0–36.0)
MCV: 86.1 fL (ref 80.0–100.0)
Monocytes Absolute: 1.3 10*3/uL — ABNORMAL HIGH (ref 0.1–1.0)
Monocytes Relative: 10 %
Neutro Abs: 9.7 10*3/uL — ABNORMAL HIGH (ref 1.7–7.7)
Neutrophils Relative %: 76 %
Platelets: 273 10*3/uL (ref 150–400)
RBC: 3.37 MIL/uL — ABNORMAL LOW (ref 4.22–5.81)
RDW: 17.7 % — ABNORMAL HIGH (ref 11.5–15.5)
WBC: 12.6 10*3/uL — ABNORMAL HIGH (ref 4.0–10.5)
nRBC: 0 % (ref 0.0–0.2)

## 2023-03-15 LAB — TYPE AND SCREEN
ABO/RH(D): A POS
Antibody Screen: NEGATIVE

## 2023-03-15 LAB — RESP PANEL BY RT-PCR (RSV, FLU A&B, COVID)  RVPGX2
Influenza A by PCR: NEGATIVE
Influenza B by PCR: NEGATIVE
Resp Syncytial Virus by PCR: NEGATIVE
SARS Coronavirus 2 by RT PCR: NEGATIVE

## 2023-03-15 LAB — URINALYSIS, ROUTINE W REFLEX MICROSCOPIC
Bacteria, UA: NONE SEEN
Bilirubin Urine: NEGATIVE
Glucose, UA: NEGATIVE mg/dL
Ketones, ur: NEGATIVE mg/dL
Leukocytes,Ua: NEGATIVE
Nitrite: NEGATIVE
Protein, ur: NEGATIVE mg/dL
RBC / HPF: >50 RBC/hpf (ref 0–5)
Specific Gravity, Urine: 1.014 (ref 1.005–1.030)
pH: 6 (ref 5.0–8.0)

## 2023-03-15 MED ORDER — ACETAMINOPHEN 650 MG RE SUPP: 650.0000 mg | Freq: Four times a day (QID) | RECTAL | Status: DC | PRN

## 2023-03-15 MED ORDER — SODIUM CHLORIDE 0.9 % IV BOLUS
1000.0000 mL | Freq: Once | INTRAVENOUS | Status: AC
  Administered 2023-03-15: 1000 mL via INTRAVENOUS

## 2023-03-15 MED ORDER — FLUTICASONE PROPIONATE 50 MCG/ACT NA SUSP
1.0000 | Freq: Every day | NASAL | Status: DC
  Administered 2023-03-15: 2 via NASAL
  Administered 2023-03-16 – 2023-03-18 (×3): 1 via NASAL
  Filled 2023-03-15: qty 16

## 2023-03-15 MED ORDER — SODIUM CHLORIDE 0.9 % IV SOLN: INTRAVENOUS | Status: DC

## 2023-03-15 MED ORDER — IPRATROPIUM-ALBUTEROL 0.5-2.5 (3) MG/3ML IN SOLN
3.0000 mL | Freq: Four times a day (QID) | RESPIRATORY_TRACT | Status: DC | PRN
Start: 2023-03-15 — End: 2023-03-18

## 2023-03-15 MED ORDER — ALBUTEROL SULFATE HFA 108 (90 BASE) MCG/ACT IN AERS: 2.0000 | INHALATION_SPRAY | Freq: Four times a day (QID) | RESPIRATORY_TRACT | Status: DC | PRN

## 2023-03-15 MED ORDER — DOCUSATE SODIUM 100 MG PO CAPS: 100.0000 mg | ORAL_CAPSULE | Freq: Every day | ORAL | Status: DC | PRN

## 2023-03-15 MED ORDER — TAMSULOSIN HCL 0.4 MG PO CAPS
0.4000 mg | ORAL_CAPSULE | Freq: Every day | ORAL | Status: DC
  Administered 2023-03-15 – 2023-03-18 (×4): 0.4 mg via ORAL
  Filled 2023-03-15 (×4): qty 1

## 2023-03-15 MED ORDER — UMECLIDINIUM BROMIDE 62.5 MCG/ACT IN AEPB
1.0000 | INHALATION_SPRAY | Freq: Every day | RESPIRATORY_TRACT | Status: DC
  Administered 2023-03-16 – 2023-03-18 (×3): 1 via RESPIRATORY_TRACT
  Filled 2023-03-15: qty 7

## 2023-03-15 MED ORDER — IPRATROPIUM-ALBUTEROL 0.5-2.5 (3) MG/3ML IN SOLN
3.0000 mL | Freq: Four times a day (QID) | RESPIRATORY_TRACT | Status: DC
  Administered 2023-03-15 – 2023-03-17 (×6): 3 mL via RESPIRATORY_TRACT
  Filled 2023-03-15 (×6): qty 3

## 2023-03-15 MED ORDER — ONDANSETRON HCL 4 MG/2ML IJ SOLN
4.0000 mg | Freq: Four times a day (QID) | INTRAMUSCULAR | Status: DC | PRN
  Administered 2023-03-16: 4 mg via INTRAVENOUS
  Filled 2023-03-15: qty 2

## 2023-03-15 MED ORDER — CHOLECALCIFEROL 50 MCG (2000 UT) PO TABS: 1.0000 | ORAL_TABLET | Freq: Every day | ORAL | Status: DC

## 2023-03-15 MED ORDER — POLYETHYLENE GLYCOL 3350 17 G PO PACK: 17.0000 g | PACK | Freq: Every day | ORAL | Status: DC

## 2023-03-15 MED ORDER — SODIUM CHLORIDE 0.9 % IV BOLUS
500.0000 mL | Freq: Once | INTRAVENOUS | Status: AC
  Administered 2023-03-15: 500 mL via INTRAVENOUS

## 2023-03-15 MED ORDER — MIRTAZAPINE 30 MG PO TABS
30.0000 mg | ORAL_TABLET | Freq: Every day | ORAL | Status: DC
  Administered 2023-03-15 – 2023-03-17 (×3): 30 mg via ORAL
  Filled 2023-03-15 (×3): qty 1

## 2023-03-15 MED ORDER — VITAMIN D 25 MCG (1000 UNIT) PO TABS
2000.0000 [IU] | ORAL_TABLET | Freq: Every day | ORAL | Status: DC
  Administered 2023-03-15 – 2023-03-18 (×4): 2000 [IU] via ORAL
  Filled 2023-03-15 (×4): qty 2

## 2023-03-15 MED ORDER — ONDANSETRON HCL 4 MG PO TABS
4.0000 mg | ORAL_TABLET | Freq: Four times a day (QID) | ORAL | Status: DC | PRN
Start: 2023-03-15 — End: 2023-03-17

## 2023-03-15 MED ORDER — POLYETHYLENE GLYCOL 3350 17 G PO PACK
17.0000 g | PACK | Freq: Two times a day (BID) | ORAL | Status: DC
  Administered 2023-03-16 – 2023-03-18 (×5): 17 g via ORAL
  Filled 2023-03-15 (×5): qty 1

## 2023-03-15 MED ORDER — PANTOPRAZOLE SODIUM 40 MG PO TBEC
40.0000 mg | DELAYED_RELEASE_TABLET | Freq: Every day | ORAL | Status: DC
  Administered 2023-03-15 – 2023-03-16 (×2): 40 mg via ORAL
  Filled 2023-03-15 (×2): qty 1

## 2023-03-15 MED ORDER — ACETAMINOPHEN 325 MG PO TABS
650.0000 mg | ORAL_TABLET | Freq: Four times a day (QID) | ORAL | Status: DC | PRN
  Administered 2023-03-16 – 2023-03-17 (×2): 650 mg via ORAL
  Filled 2023-03-15 (×4): qty 2

## 2023-03-15 MED ORDER — ALPRAZOLAM 0.5 MG PO TABS
0.5000 mg | ORAL_TABLET | Freq: Two times a day (BID) | ORAL | Status: DC | PRN
  Administered 2023-03-16 – 2023-03-17 (×2): 0.5 mg via ORAL
  Filled 2023-03-15 (×2): qty 1

## 2023-03-15 MED ORDER — THEOPHYLLINE ER 200 MG PO TB12
200.0000 mg | ORAL_TABLET | Freq: Two times a day (BID) | ORAL | Status: DC
  Filled 2023-03-15 (×6): qty 1

## 2023-03-15 MED ORDER — FLUTICASONE FUROATE-VILANTEROL 100-25 MCG/ACT IN AEPB
1.0000 | INHALATION_SPRAY | Freq: Every day | RESPIRATORY_TRACT | Status: DC
  Administered 2023-03-16 – 2023-03-18 (×3): 1 via RESPIRATORY_TRACT
  Filled 2023-03-15: qty 28

## 2023-03-15 MED ORDER — HYDRALAZINE HCL 20 MG/ML IJ SOLN: 5.0000 mg | INTRAMUSCULAR | Status: DC | PRN

## 2023-03-15 MED ORDER — HEPARIN SODIUM (PORCINE) 5000 UNIT/ML IJ SOLN
5000.0000 [IU] | Freq: Three times a day (TID) | INTRAMUSCULAR | Status: DC
Start: 2023-03-15 — End: 2023-03-18
  Administered 2023-03-15 – 2023-03-18 (×8): 5000 [IU] via SUBCUTANEOUS
  Filled 2023-03-15 (×8): qty 1

## 2023-03-15 NOTE — ED Notes (Signed)
Warm blankets given PT remains pain free at this time

## 2023-03-15 NOTE — ED Notes (Signed)
Post void urine 13mL

## 2023-03-15 NOTE — ED Notes (Signed)
Attempted to in and out cath Unsuccessful Noted that pt's diaper was soaked through with urine Along with SNF sheets and chux  COVID swab collect

## 2023-03-15 NOTE — H&P (Signed)
TRH H&P   Patient Demographics:    Stephen Fitzpatrick, is a 77 y.o. male  MRN: [id number]   DOB - [birth year]  Admit Date - 03/15/2023  Outpatient Primary MD for the patient is Rebekah Chesterfield, NP  Referring MD/NP/PA: Dr. Estell Harpin  Outpatient Specialists: oncology and urology at [facility]  Patient coming from: [facility] subacute rehab  Chief Complaint  Patient presents with   Abnormal Lab      HPI:    Stephen Fitzpatrick  is a 77 y.o. male,  with medical history significant of carcinoma of the lung, CHF, COPD, GERD, anxiety, chronic respiratory failure on 4 L nasal cannula at baseline, active workup with [facility] oncology for osteoplastic lesions with concern for lung versus prostate malignancy pending PET scan.   -Patient was sent from his facility for weakness, and abnormal labs) it states hemoglobin 7.3, and white blood cell count of 17, but this lab appointment reputation showing white blood cell count of 12, and hemoglobin of 8.7) patient has been in subacute rehab at [facility] for last 3 weeks, over last few days, patient reports progressive weakness, fatigue, and some difficulty with urination, incontinence, he does endorse weight loss, poor appetite as well he denies fever, chills, chest pain. -In ED he was noticed with soft blood pressure, lowest was 94/75, as well he was noted to have elevated creatinine at 3.15 from normal baseline, albumin was low at 3, hemoglobin around baseline of 8.7, patient appears to be clinically dehydrated, So [facility]  hospitalist consulted to admit   Review of systems:      A full 10 point Review of Systems was done, except as stated above, all other Review of Systems were negative.   With Past History of the following :    Past Medical History:  Diagnosis Date   Adenocarcinoma of lung (HCC)    CHF (congestive heart failure) (HCC)    COPD (chronic  obstructive pulmonary disease) (HCC)    Mass of lung    Nodule of left lung    PONV (postoperative nausea and vomiting)       Past Surgical History:  Procedure Laterality Date   BACK SURGERY     HIP PINNING,CANNULATED Left 10/13/2021   Procedure: CANNULATED HIP PINNING;  Surgeon: Oliver Barre, MD;  Location: [facility];  Service: Orthopedics;  Laterality: Left;   Left video-assisted thoracic surgery, resection  08/24/10   Dr Edwyna Shell      Social History:     Social History   Tobacco Use   Smoking status: Former    Current packs/day: 0.00    Types: Cigarettes    Quit date: 08/24/2010    Years since quitting: 12.5   Smokeless tobacco: Never  Substance Use Topics   Alcohol use: No        Family History :    History reviewed. No pertinent family history.  Home Medications:   Prior to Admission medications   Medication Sig Start Date End Date Taking? Authorizing Provider  acetaminophen (TYLENOL) 325 MG tablet Take 650 mg by mouth every 6 (six) hours as needed for mild pain.   Yes [provider]  albuterol (VENTOLIN HFA) 108 (90 Base) MCG/ACT inhaler Inhale 2 puffs into the lungs every 6 (six) hours as needed for wheezing or shortness of breath.   Yes [provider]  celecoxib (CELEBREX) 100 MG capsule Take 100 mg by mouth daily. 07/23/22  Yes [provider]  Cholecalciferol 50 MCG (2000 UT) TABS Take 1 tablet by mouth daily.   Yes [provider]  Dextrose-Sodium Chloride (DEXTROSE 5 % AND 0.9% NACL) 5-0.9 % Inject 60 mLs into the vein once. 60 ml x2 liters via clysis. For 3 days.   Yes [provider]  docusate sodium (COLACE) 100 MG capsule Take 100 mg by mouth daily as needed for mild constipation.   Yes [provider]  fluticasone (FLONASE) 50 MCG/ACT nasal spray Place 1-2 sprays into both nostrils daily. 03/10/23  Yes [provider]  Fluticasone-Umeclidin-Vilant (TRELEGY ELLIPTA) 100-62.5-25 MCG/ACT AEPB  Inhale 1 puff into the lungs daily.   Yes [provider]  ipratropium-albuterol (DUONEB) 0.5-2.5 (3) MG/3ML SOLN Take 3 mLs by nebulization in the morning and at bedtime.   Yes [provider]  ipratropium-albuterol (DUONEB) 0.5-2.5 (3) MG/3ML SOLN Take 3 mLs by nebulization every 6 (six) hours as needed (respiratory disease/disorder give as needed).   Yes [provider]  lisinopril (ZESTRIL) 10 MG tablet Take 10 mg by mouth daily.   Yes [provider]  Menthol-Zinc Oxide 0.44-20.625 % OINT Apply 1 Application topically in the morning, at noon, and at bedtime. To buttocks   Yes [provider]  metoprolol tartrate (LOPRESSOR) 50 MG tablet Take 1 tablet (50 mg total) by mouth 2 (two) times daily. Patient taking differently: Take 50 mg by mouth daily. 01/08/23  Yes Johnson, Clanford L, MD  mirtazapine (REMERON) 30 MG tablet Take 30 mg by mouth at bedtime.   Yes [provider]  Multiple Vitamin (MULTIVITAMIN WITH MINERALS) TABS tablet Take 1 tablet by mouth daily. 01/09/23  Yes Johnson, Clanford L, MD  omeprazole (PRILOSEC) 40 MG capsule Take 40 mg by mouth every morning. 07/22/22  Yes [provider]  ondansetron (ZOFRAN) 4 MG tablet Take 1 tablet (4 mg total) by mouth daily as needed for nausea or vomiting. 01/09/23 01/09/24 Yes Shah, Pratik D, DO  OXYGEN Inhale 4 L into the lungs continuous. Every hour   Yes [provider]  polyethylene glycol (MIRALAX / GLYCOLAX) 17 g packet Take 17 g by mouth daily. 01/08/23  Yes Johnson, Clanford L, MD  theophylline (THEODUR) 200 MG 12 hr tablet Take 200 mg by mouth 2 (two) times daily.   Yes [provider]     Allergies:    No Known Allergies   Physical Exam:   Vitals  Blood pressure (!) 95/55, pulse 90, temperature 98 F (36.7 C), resp. rate 20, height 6\' 4"  (1.93 m), weight 64.5 kg, SpO2 94%.   1. General Young male, laying in bed, in no apparent distress,  deconditioned, chronically ill-appearing  2. Normal affect and insight, Not Suicidal or Homicidal, Awake Alert, Oriented X 3.  3. No F.N deficits, ALL C.Nerves Intact, Strength 5/5 all 4 extremities, Sensation intact all 4 extremities, Plantars down going.  4. Ears and Eyes appear Normal, Conjunctivae clear, PERRLA.  Moist Oral Mucosa.  5. Supple Neck, No JVD, No cervical lymphadenopathy appriciated, No Carotid Bruits.  6. Symmetrical Chest wall movement, Good air movement bilaterally, CTAB.  7. RRR, No Gallops, Rubs or Murmurs, No Parasternal Heave.  8. Positive Bowel Sounds, Abdomen Soft, No tenderness, No organomegaly appriciated,No rebound -guarding or rigidity.  9.  No Cyanosis, Normal Skin Turgor, No Skin Rash or Bruise.  10.  joints appear normal , no effusions, Normal ROM.     Data Review:    CBC Recent Labs  Lab 03/15/23 1557  WBC 12.6*  HGB 8.7*  HCT 29.0*  PLT 273  MCV 86.1  MCH 25.8*  MCHC 30.0  RDW 17.7*  LYMPHSABS 1.3  MONOABS 1.3*  EOSABS 0.1  BASOSABS 0.1   ------------------------------------------------------------------------------------------------------------------  Chemistries  Recent Labs  Lab 03/15/23 1557  NA 137  K 4.9  CL 107  CO2 23  GLUCOSE 84  BUN 61*  CREATININE 3.15*  CALCIUM 8.6*  AST 19  ALT 13  ALKPHOS 54  BILITOT 0.4   ------------------------------------------------------------------------------------------------------------------ estimated creatinine clearance is 17.6 mL/min (A) (by C-G formula based on SCr of 3.15 mg/dL (H)). ------------------------------------------------------------------------------------------------------------------ No results for input(s): "TSH", "T4TOTAL", "T3FREE", "THYROIDAB" in the last 72 hours.  Invalid input(s): "FREET3"  Coagulation profile No results for input(s): "INR", "PROTIME" in the last 168  hours. ------------------------------------------------------------------------------------------------------------------- No results for input(s): "DDIMER" in the last 72 hours. -------------------------------------------------------------------------------------------------------------------  Cardiac Enzymes No results for input(s): "CKMB", "TROPONINI", "MYOGLOBIN" in the last 168 hours.  Invalid input(s): "CK" ------------------------------------------------------------------------------------------------------------------    Component Value Date/Time   BNP 48.0 10/04/2017 0351     ---------------------------------------------------------------------------------------------------------------  Urinalysis    Component Value Date/Time   COLORURINE YELLOW 03/15/2023 1606   APPEARANCEUR CLEAR 03/15/2023 1606   LABSPEC 1.014 03/15/2023 1606   PHURINE 6.0 03/15/2023 1606   GLUCOSEU NEGATIVE 03/15/2023 1606   HGBUR MODERATE (A) 03/15/2023 1606   BILIRUBINUR NEGATIVE 03/15/2023 1606   KETONESUR NEGATIVE 03/15/2023 1606   PROTEINUR NEGATIVE 03/15/2023 1606   UROBILINOGEN 1.0 02/21/2012 1644   NITRITE NEGATIVE 03/15/2023 1606   LEUKOCYTESUR NEGATIVE 03/15/2023 1606    ----------------------------------------------------------------------------------------------------------------   Imaging Results:    DG Chest Port 1 View  Result Date: 03/15/2023 CLINICAL DATA:  Shortness of breath, lung cancer EXAM: PORTABLE CHEST 1 VIEW COMPARISON:  01/09/2023 FINDINGS: Postprocedural changes with volume loss in the left hemithorax. Associated left lung scarring/radiation changes, unchanged. Dominant bulla in the left upper lobe. Mild left basilar opacity favors scarring over infection/pneumonia. 14 mm nodular opacity at the right lung base, poorly evaluated. Mild right basilar atelectasis. No pleural effusion or pneumothorax. The heart is normal in size. IMPRESSION: Postprocedural changes with  volume loss in the left hemithorax. Associated left lung scarring/radiation changes, unchanged. 14 mm nodular opacity at the right lung base, poorly evaluated. Chest CT is suggested for further evaluation. Electronically Signed   By: Charline Bills M.D.   On: 03/15/2023 17:42    EKG:  Vent. rate 107 BPM PR interval 137 ms QRS duration 91 ms QT/QTcB 368/491 ms P-R-T axes 97 61 76 Sinus tachycardia LVH with secondary repolarization abnormality Anterior infarct, old   Assessment & Plan:    Principal Problem:   AKI (acute kidney injury) (HCC) Active Problems:   Nodule of left lower lung   Adenocarcinoma of Left Lower Lobe lung   COPD (chronic obstructive pulmonary disease) (HCC)   Chronic respiratory failure with hypoxia (HCC)   HTN (hypertension)   History of cerebrovascular accident (CVA) due  to ischemia   GERD (gastroesophageal reflux disease)   Protein-calorie malnutrition, severe   AKI -Patient presents with elevated creatinine at 3.15, with normal baseline, this is most likely due to volume depletion-dehydration in the setting of soft blood pressure with antihypertensive medications used including lisinopril, as well he has been on Celebrex -As well there may be concern of urinary retention, given patient with difficulty urination, and he voids large volume at the time, sometimes incontinent and with difficulty inserting in and out catheter while in ED which might indicate some BPH -Continue with IV fluids -Monitor closely for urinary retention bladder scan every 8 hours (his bladder scan in ED was without significant post residual volume, but it has been obtained after he urinated 500 cc at once) -Will start on Flomax empirically -Avoid low blood pressure and nephrotoxic medications  Pulmonary nodule Osteoblastic lesion  patient is currently following with [facility] oncology for osteoblastic lesion in Thoracic, lumbar spine, and pelvis, patient is for recurrent lung cancer  versus prostate cancer, plan for PET scan as an outpatient  GERD (gastroesophageal reflux disease) -Continue Protonix   HTN (hypertension) -Blood pressure soft in ED, will actually hold his metoprolol and lisinopril, continue with IV fluids   Chronic Constipation  -Continue with laxatives   Chronic respiratory failure with hypoxia  Chronic COPD -He is on 4 L oxygen at baseline -Continue with home medications including Trelegy , as needed DuoNebs -He is on theophylline as well  Failure to thrive Weakness-deconditioning -Report decline over the last couple weeks, poor appetite -Consult PT-OT -Will consult nutritionist service -Continue with mirtazapine -To be encouraged with incentive spirometry and flutter valve  DVT Prophylaxis Heparin   AM Labs Ordered, also please review Full Orders  Family Communication: Admission, patients condition and plan of care including tests being ordered have been discussed with the patient and at bedside who indicate understanding and agree with the plan and Code Status.  Code Status full code  Likely DC to back to subacute rehab  Consults called: None  Admission status: Inpatient  Time spent in minutes : 70 minutes   Huey Bienenstock M.D on 03/15/2023 at 8:34 PM   [facility] - Office  [phone]

## 2023-03-15 NOTE — ED Notes (Signed)
ED sandwich and ginger ale given to pt

## 2023-03-15 NOTE — ED Provider Notes (Signed)
[place] EMERGENCY DEPARTMENT AT [facility] Provider Note   CSN: [id number] Arrival date & time: 03/15/23  1531     History {Add pertinent medical, surgical, social history, OB history to HPI:1} Chief Complaint  Patient presents with   Abnormal Lab    Stephen Fitzpatrick is a 77 y.o. male.  Patient has a history of lung cancer, congestive heart.,  COPD and is on 4 L nasal.  He presents with weakness   Weakness      Home Medications Prior to Admission medications   Medication Sig Start Date End Date Taking? Authorizing Provider  acetaminophen (TYLENOL) 325 MG tablet Take 650 mg by mouth every 6 (six) hours as needed for mild pain.   Yes [provider]  albuterol (VENTOLIN HFA) 108 (90 Base) MCG/ACT inhaler Inhale 2 puffs into the lungs every 6 (six) hours as needed for wheezing or shortness of breath.   Yes [provider]  celecoxib (CELEBREX) 100 MG capsule Take 100 mg by mouth daily. 07/23/22  Yes [provider]  Cholecalciferol 50 MCG (2000 UT) TABS Take 1 tablet by mouth daily.   Yes [provider]  Dextrose-Sodium Chloride (DEXTROSE 5 % AND 0.9% NACL) 5-0.9 % Inject 60 mLs into the vein once. 60 ml x2 liters via clysis. For 3 days.   Yes [provider]  docusate sodium (COLACE) 100 MG capsule Take 100 mg by mouth daily as needed for mild constipation.   Yes [provider]  fluticasone (FLONASE) 50 MCG/ACT nasal spray Place 1-2 sprays into both nostrils daily. 03/10/23  Yes [provider]  Fluticasone-Umeclidin-Vilant (TRELEGY ELLIPTA) 100-62.5-25 MCG/ACT AEPB Inhale 1 puff into the lungs daily.   Yes [provider]  ipratropium-albuterol (DUONEB) 0.5-2.5 (3) MG/3ML SOLN Take 3 mLs by nebulization in the morning and at bedtime.   Yes [provider]  ipratropium-albuterol (DUONEB) 0.5-2.5 (3) MG/3ML SOLN Take 3 mLs by nebulization every 6 (six) hours as needed (respiratory  disease/disorder give as needed).   Yes [provider]  lisinopril (ZESTRIL) 10 MG tablet Take 10 mg by mouth daily.   Yes [provider]  Menthol-Zinc Oxide 0.44-20.625 % OINT Apply 1 Application topically in the morning, at noon, and at bedtime. To buttocks   Yes [provider]  metoprolol tartrate (LOPRESSOR) 50 MG tablet Take 1 tablet (50 mg total) by mouth 2 (two) times daily. Patient taking differently: Take 50 mg by mouth daily. 01/08/23  Yes Johnson, Clanford L, MD  mirtazapine (REMERON) 30 MG tablet Take 30 mg by mouth at bedtime.   Yes [provider]  Multiple Vitamin (MULTIVITAMIN WITH MINERALS) TABS tablet Take 1 tablet by mouth daily. 01/09/23  Yes Johnson, Clanford L, MD  omeprazole (PRILOSEC) 40 MG capsule Take 40 mg by mouth every morning. 07/22/22  Yes [provider]  ondansetron (ZOFRAN) 4 MG tablet Take 1 tablet (4 mg total) by mouth daily as needed for nausea or vomiting. 01/09/23 01/09/24 Yes Shah, Pratik D, DO  OXYGEN Inhale 4 L into the lungs continuous. Every hour   Yes [provider]  polyethylene glycol (MIRALAX / GLYCOLAX) 17 g packet Take 17 g by mouth daily. 01/08/23  Yes Johnson, Clanford L, MD  theophylline (THEODUR) 200 MG 12 hr tablet Take 200 mg by mouth 2 (two) times daily.   Yes [provider]      Allergies    Patient has no known allergies.    Review of Systems  Review of Systems  Neurological:  Positive for weakness.    Physical Exam Updated Vital Signs BP 117/68   Pulse 81   Temp 97.6 F (36.4 C) (Oral)   Resp 20   Ht 6\' 4"  (1.93 m)   Wt 64.5 kg   SpO2 100%   BMI 17.31 kg/m  Physical Exam  ED Results / Procedures / Treatments   Labs (all labs ordered are listed, but only abnormal results are displayed) Labs Reviewed  CBC WITH DIFFERENTIAL/PLATELET - Abnormal; Notable for the following components:      Result Value   WBC 12.6 (*)    RBC 3.37 (*)    Hemoglobin 8.7 (*)     HCT 29.0 (*)    MCH 25.8 (*)    RDW 17.7 (*)    Neutro Abs 9.7 (*)    Monocytes Absolute 1.3 (*)    Abs Immature Granulocytes 0.13 (*)    All other components within normal limits  COMPREHENSIVE METABOLIC PANEL - Abnormal; Notable for the following components:   BUN 61 (*)    Creatinine, Ser 3.15 (*)    Calcium 8.6 (*)    Total Protein 6.3 (*)    Albumin 3.0 (*)    GFR, Estimated 19 (*)    All other components within normal limits  RESP PANEL BY RT-PCR (RSV, FLU A&B, COVID)  [id number]  URINALYSIS, ROUTINE W REFLEX MICROSCOPIC  TYPE AND SCREEN    EKG None  Radiology DG Chest Port 1 View  Result Date: 03/15/2023 CLINICAL DATA:  Shortness of breath, lung cancer EXAM: PORTABLE CHEST 1 VIEW COMPARISON:  01/09/2023 FINDINGS: Postprocedural changes with volume loss in the left hemithorax. Associated left lung scarring/radiation changes, unchanged. Dominant bulla in the left upper lobe. Mild left basilar opacity favors scarring over infection/pneumonia. 14 mm nodular opacity at the right lung base, poorly evaluated. Mild right basilar atelectasis. No pleural effusion or pneumothorax. The heart is normal in size. IMPRESSION: Postprocedural changes with volume loss in the left hemithorax. Associated left lung scarring/radiation changes, unchanged. 14 mm nodular opacity at the right lung base, poorly evaluated. Chest CT is suggested for further evaluation. Electronically Signed   By: Charline Bills M.D.   On: 03/15/2023 17:42    Procedures Procedures  {Document cardiac monitor, telemetry assessment procedure when appropriate:1}  Medications Ordered in ED Medications  sodium chloride 0.9 % bolus 500 mL (0 mLs Intravenous Stopped 03/15/23 1929)  sodium chloride 0.9 % bolus 1,000 mL (0 mLs Intravenous Stopped 03/15/23 1906)    ED Course/ Medical Decision Making/ A&P   {   Click here for ABCD2, HEART and other calculatorsREFRESH Note before signing :1}                              Medical  Decision Making Amount and/or Complexity of Data Reviewed Labs: ordered. Radiology: ordered.  Risk Decision regarding hospitalization.   Patient with an AKI.  He is admitted to medicine  {Document critical care time when appropriate:1} {Document review of labs and clinical decision tools ie heart score, Chads2Vasc2 etc:1}  {Document your independent review of radiology images, and any outside records:1} {Document your discussion with family members, caretakers, and with consultants:1} {Document social determinants of health affecting pt's care:1} {Document your decision making why or why not admission, treatments were needed:1} Final Clinical Impression(s) / ED Diagnoses Final diagnoses:  AKI (acute kidney injury) (HCC)    Rx / DC Orders  ED Discharge Orders     None

## 2023-03-15 NOTE — ED Triage Notes (Signed)
Pt brought by EMS from [facility]   Hx of Lung CA Wears 4LPM O2 at all times  Sent to ED for general weakness WBC 17 and Hemoglobin of 7.3  Pt denies pain Noted brown sputum on sheet during triage  Pt stated he must get cathed for URINE  Pt stated he has BM's x2 daily Normal for pt  Denies vomiting and blood in stool

## 2023-03-15 NOTE — ED Notes (Signed)
Pt refused 2nd attempt on cath Pt has urinal at bedside Pt is dry at this time Family at bedside Per MD pt can eat and drink Pt to be admitted for AKI

## 2023-03-16 DIAGNOSIS — N179 Acute kidney failure, unspecified: Secondary | ICD-10-CM | POA: Diagnosis not present

## 2023-03-16 LAB — CBC
HCT: 23.7 % — ABNORMAL LOW (ref 39.0–52.0)
Hemoglobin: 7 g/dL — ABNORMAL LOW (ref 13.0–17.0)
MCH: 25.9 pg — ABNORMAL LOW (ref 26.0–34.0)
MCHC: 29.5 g/dL — ABNORMAL LOW (ref 30.0–36.0)
MCV: 87.8 fL (ref 80.0–100.0)
Platelets: 186 10*3/uL (ref 150–400)
RBC: 2.7 MIL/uL — ABNORMAL LOW (ref 4.22–5.81)
RDW: 17.8 % — ABNORMAL HIGH (ref 11.5–15.5)
WBC: 8.5 10*3/uL (ref 4.0–10.5)
nRBC: 0 % (ref 0.0–0.2)

## 2023-03-16 LAB — BASIC METABOLIC PANEL
Anion gap: 7 (ref 5–15)
BUN: 43 mg/dL — ABNORMAL HIGH (ref 8–23)
CO2: 19 mmol/L — ABNORMAL LOW (ref 22–32)
Calcium: 7.9 mg/dL — ABNORMAL LOW (ref 8.9–10.3)
Chloride: 113 mmol/L — ABNORMAL HIGH (ref 98–111)
Creatinine, Ser: 2.14 mg/dL — ABNORMAL HIGH (ref 0.61–1.24)
GFR, Estimated: 31 mL/min — ABNORMAL LOW (ref >60)
Glucose, Bld: 106 mg/dL — ABNORMAL HIGH (ref 70–99)
Potassium: 4.3 mmol/L (ref 3.5–5.1)
Sodium: 139 mmol/L (ref 135–145)

## 2023-03-16 LAB — PREPARE RBC (CROSSMATCH)

## 2023-03-16 MED ORDER — METOCLOPRAMIDE HCL 5 MG/ML IJ SOLN
INTRAMUSCULAR | Status: AC
  Administered 2023-03-17: 10 mg
  Filled 2023-03-16: qty 2

## 2023-03-16 MED ORDER — THEOPHYLLINE ER 400 MG PO TB24
400.0000 mg | ORAL_TABLET | Freq: Every day | ORAL | Status: DC
  Administered 2023-03-16 – 2023-03-18 (×3): 400 mg via ORAL
  Filled 2023-03-16 (×6): qty 1

## 2023-03-16 MED ORDER — PANTOPRAZOLE SODIUM 40 MG PO TBEC
40.0000 mg | DELAYED_RELEASE_TABLET | Freq: Two times a day (BID) | ORAL | Status: DC
  Administered 2023-03-16 – 2023-03-18 (×5): 40 mg via ORAL
  Filled 2023-03-16 (×5): qty 1

## 2023-03-16 MED ORDER — SODIUM CHLORIDE 0.9% IV SOLUTION: Freq: Once | INTRAVENOUS | Status: AC

## 2023-03-16 MED ORDER — ENSURE ENLIVE PO LIQD
237.0000 mL | Freq: Two times a day (BID) | ORAL | Status: DC
  Administered 2023-03-16 – 2023-03-18 (×4): 237 mL via ORAL

## 2023-03-16 NOTE — Progress Notes (Addendum)
   03/16/23 0932  Urine Characteristics  Bladder Scan Volume (mL) 239 mL   Asked patient if he had to urinate. Patient states not at the moment. Patient states he will let us know when he has to go because he does not want an in and out cath.  1115: patient urinated 325 ml

## 2023-03-16 NOTE — Progress Notes (Signed)
PROGRESS NOTE    Stephen Fitzpatrick  WJX:[id number] DOB: 06-23-1945 DOA: 03/15/2023 PCP: Rebekah Chesterfield, NP   Brief Narrative: 77 year old with past medical history significant for carcinoma of the lung, CHF, COPD, GERD, anxiety, chronic hypoxic respiratory failure on 4 L of oxygen, undergoing workup at [facility] oncology for osteoblastic lesion with concern for lung versus prostate malignancy pending PET scan.  Presents from his facility with abnormal labs hemoglobin 7.3, white blood cell 17, progressive weakness and fatigue poor oral intake.  Evaluation in the ED he was found to have a creatinine of 3.19.  Patient admitted with anemia and AKI and failure to thrive.      Assessment & Plan:   Principal Problem:   AKI (acute kidney injury) (HCC) Active Problems:   Nodule of left lower lung   Adenocarcinoma of Left Lower Lobe lung   COPD (chronic obstructive pulmonary disease) (HCC)   Chronic respiratory failure with hypoxia (HCC)   HTN (hypertension)   History of cerebrovascular accident (CVA) due to ischemia   GERD (gastroesophageal reflux disease)   Protein-calorie malnutrition, severe     1-AKI: -Patient presents with elevated creatinine 3.1, previous normal creatinine, poor oral intake.  - AKI in the setting of ACE use, hypovolemia, and Celebrex -Continue to monitor for urine retention with bladder scan. -Continue with IV fluids -Started Flomax -Creatinine down to 2.1   2-Pulmonary Nodule Osteoblastic Lesion.  -Needs to continue follow with [facility].    3-HTN:  BP soft, hold metoprolol and lisinopril.  Continue with IV fluids  GERD;  PPI  Chronic constipation: Continue with laxative  Chronic hypoxic respiratory failure, chronic COPD Continue 4 L of oxygen at baseline Continue Trelegy, DuoNebs as needed.  Continue theophylline  Failure to thrive, weakness deconditioning Poor oral intake. Start Ensure Continue with mirtazapine PT OT  consulted  Anemia: Plan to proceed with 1 unit of packed red blood cell Hb  down to 7.0   Leukocytosis resolved            Estimated body mass index is 17.31 kg/m as calculated from the following:   Height as of this encounter: 6\' 4"  (1.93 m).   Weight as of this encounter: 64.5 kg.   DVT prophylaxis: Heparin Code Status: Full code Family Communication: Care discussed with patient no family at bedside Disposition Plan:  Status is: Inpatient Remains inpatient appropriate because: Management of AKI    Consultants:  none  Procedures:  none  Antimicrobials:    Subjective: Because he is undergoing workup for malignancy he is alert, he reports poor oral intake and poor appetite.  He would like to be started on something to help with his appetite.  He is already on mirtazapine.  Will avoid Megace  Objective: Vitals:   03/15/23 2121 03/16/23 0407 03/16/23 1348 03/16/23 1413  BP: 94/75 (!) 102/58 109/66 111/60  Pulse: 100 88 (!) 103 (!) 102  Resp: 18 18 17 18   Temp: 97.7 F (36.5 C) 98.3 F (36.8 C) 98.1 F (36.7 C) 98.3 F (36.8 C)  TempSrc: Oral  Oral   SpO2: 100% 100% 100%   Weight:      Height:        Intake/Output Summary (Last 24 hours) at 03/16/2023 1518 Last data filed at 03/16/2023 1100 Gross per 24 hour  Intake 1757.29 ml  Output 1125 ml  Net 632.29 ml   Filed Weights   03/15/23 1553  Weight: 64.5 kg    Examination:  General exam: Cachectic  Respiratory system: Clear to auscultation. Respiratory effort normal. Cardiovascular system: S1 & S2 heard, RRR. No JVD, murmurs, rubs, gallops or clicks. No pedal edema. Gastrointestinal system: Abdomen is nondistended, soft and nontender. No organomegaly or masses felt. Normal bowel sounds heard. Central nervous system: Alert and oriented.  Extremities: Symmetric 5 x 5 power.   Data Reviewed: I have personally reviewed following labs and imaging studies  CBC: Recent Labs  Lab 03/15/23 1557  03/16/23 0358  WBC 12.6* 8.5  NEUTROABS 9.7*  --   HGB 8.7* 7.0*  HCT 29.0* 23.7*  MCV 86.1 87.8  PLT 273 186   Basic Metabolic Panel: Recent Labs  Lab 03/15/23 1557 03/16/23 0358  NA 137 139  K 4.9 4.3  CL 107 113*  CO2 23 19*  GLUCOSE 84 106*  BUN 61* 43*  CREATININE 3.15* 2.14*  CALCIUM 8.6* 7.9*   GFR: Estimated Creatinine Clearance: 26 mL/min (A) (by C-G formula based on SCr of 2.14 mg/dL (H)). Liver Function Tests: Recent Labs  Lab 03/15/23 1557  AST 19  ALT 13  ALKPHOS 54  BILITOT 0.4  PROT 6.3*  ALBUMIN 3.0*   No results for input(s): "LIPASE", "AMYLASE" in the last 168 hours. No results for input(s): "AMMONIA" in the last 168 hours. Coagulation Profile: No results for input(s): "INR", "PROTIME" in the last 168 hours. Cardiac Enzymes: No results for input(s): "CKTOTAL", "CKMB", "CKMBINDEX", "TROPONINI" in the last 168 hours. BNP (last 3 results) No results for input(s): "PROBNP" in the last 8760 hours. HbA1C: No results for input(s): "HGBA1C" in the last 72 hours. CBG: No results for input(s): "GLUCAP" in the last 168 hours. Lipid Profile: No results for input(s): "CHOL", "HDL", "LDLCALC", "TRIG", "CHOLHDL", "LDLDIRECT" in the last 72 hours. Thyroid Function Tests: No results for input(s): "TSH", "T4TOTAL", "FREET4", "T3FREE", "THYROIDAB" in the last 72 hours. Anemia Panel: No results for input(s): "VITAMINB12", "FOLATE", "FERRITIN", "TIBC", "IRON", "RETICCTPCT" in the last 72 hours. Sepsis Labs: No results for input(s): "PROCALCITON", "LATICACIDVEN" in the last 168 hours.  Recent Results (from the past 240 hour(s))  Resp panel by RT-PCR (RSV, Flu A&B, Covid) Anterior Nasal Swab     Status: None   Collection Time: 03/15/23  4:06 PM   Specimen: Anterior Nasal Swab  Result Value Ref Range Status   SARS Coronavirus 2 by RT PCR NEGATIVE NEGATIVE Final    Comment: (NOTE) SARS-CoV-2 target nucleic acids are NOT DETECTED.  The SARS-CoV-2 RNA is  generally detectable in upper respiratory specimens during the acute phase of infection. The lowest concentration of SARS-CoV-2 viral copies this assay can detect is 138 copies/mL. A negative result does not preclude SARS-Cov-2 infection and should not be used as the sole basis for treatment or other patient management decisions. A negative result may occur with  improper specimen collection/handling, submission of specimen other than nasopharyngeal swab, presence of viral mutation(s) within the areas targeted by this assay, and inadequate number of viral copies(<138 copies/mL). A negative result must be combined with clinical observations, patient history, and epidemiological information. The expected result is Negative.  Fact Sheet for Patients:  BloggerCourse.com  Fact Sheet for Healthcare Providers:  SeriousBroker.it  This test is no t yet approved or cleared by the [place] FDA and  has been authorized for detection and/or diagnosis of SARS-CoV-2 by FDA under an Emergency Use Authorization (EUA). This EUA will remain  in effect (meaning this test can be used) for the duration of the COVID-19 declaration under Section 564(b)(1) of the  Act, 21 U.S.C.section 360bbb-3(b)(1), unless the authorization is terminated  or revoked sooner.       Influenza A by PCR NEGATIVE NEGATIVE Final   Influenza B by PCR NEGATIVE NEGATIVE Final    Comment: (NOTE) The Xpert Xpress SARS-CoV-2/FLU/RSV plus assay is intended as an aid in the diagnosis of influenza from Nasopharyngeal swab specimens and should not be used as a sole basis for treatment. Nasal washings and aspirates are unacceptable for Xpert Xpress SARS-CoV-2/FLU/RSV testing.  Fact Sheet for Patients: BloggerCourse.com  Fact Sheet for Healthcare Providers: SeriousBroker.it  This test is not yet approved or cleared by the [place] FDA and has been authorized for detection and/or diagnosis of SARS-CoV-2 by FDA under an Emergency Use Authorization (EUA). This EUA will remain in effect (meaning this test can be used) for the duration of the COVID-19 declaration under Section 564(b)(1) of the Act, 21 U.S.C. section 360bbb-3(b)(1), unless the authorization is terminated or revoked.     Resp Syncytial Virus by PCR NEGATIVE NEGATIVE Final    Comment: (NOTE) Fact Sheet for Patients: BloggerCourse.com  Fact Sheet for Healthcare Providers: SeriousBroker.it  This test is not yet approved or cleared by the [place] FDA and has been authorized for detection and/or diagnosis of SARS-CoV-2 by FDA under an Emergency Use Authorization (EUA). This EUA will remain in effect (meaning this test can be used) for the duration of the COVID-19 declaration under Section 564(b)(1) of the Act, 21 U.S.C. section 360bbb-3(b)(1), unless the authorization is terminated or revoked.  Performed at [facility], [address] [place], [place] [address]          Radiology Studies: [facility] Chest [facility] 1 View  Result Date: 03/15/2023 CLINICAL DATA:  Shortness of breath, lung cancer EXAM: PORTABLE CHEST 1 VIEW COMPARISON:  01/09/2023 FINDINGS: Postprocedural changes with volume loss in the left hemithorax. Associated left lung scarring/radiation changes, unchanged. Dominant bulla in the left upper lobe. Mild left basilar opacity favors scarring over infection/pneumonia. 14 mm nodular opacity at the right lung base, poorly evaluated. Mild right basilar atelectasis. No pleural effusion or pneumothorax. The heart is normal in size. IMPRESSION: Postprocedural changes with volume loss in the left hemithorax. Associated left lung scarring/radiation changes, unchanged. 14 mm nodular opacity at the right lung base, poorly evaluated. Chest CT is suggested for further evaluation. Electronically Signed    By: Charline Bills M.D.   On: 03/15/2023 17:42        Scheduled Meds:  cholecalciferol  2,000 Units Oral Daily   feeding supplement  237 mL Oral BID BM   fluticasone  1-2 spray Each Nare Daily   fluticasone furoate-vilanterol  1 puff Inhalation Daily   And   umeclidinium bromide  1 puff Inhalation Daily   heparin  5,000 Units Subcutaneous Q8H   ipratropium-albuterol  3 mL Nebulization QID   mirtazapine  30 mg Oral QHS   pantoprazole  40 mg Oral Daily   polyethylene glycol  17 g Oral BID   tamsulosin  0.4 mg Oral Daily   theophylline  400 mg Oral Daily   Continuous Infusions:  sodium chloride 75 mL/hr at 03/15/23 2147     LOS: 1 day    Time spent: 35 minutes    Barbara Keng A Cavan Bearden, MD [facility]   If 7PM-7AM, please contact night-coverage [email or web address]  03/16/2023, 3:18 PM

## 2023-03-16 NOTE — Progress Notes (Signed)
   03/16/23 1451  TOC Brief Assessment  Insurance and Status Reviewed  Patient has primary care physician Yes  Home environment has been reviewed From [facility]  Prior level of function: Needs Assistance  Prior/Current Home Services No current home services  Social Determinants of Health Reivew SDOH reviewed no interventions necessary  Readmission risk has been reviewed Yes  Transition of care needs transition of care needs identified, TOC will continue to follow   Pt is from [facility], TOC to follow for DC needs.

## 2023-03-17 DIAGNOSIS — N179 Acute kidney failure, unspecified: Secondary | ICD-10-CM | POA: Diagnosis not present

## 2023-03-17 DIAGNOSIS — E43 Unspecified severe protein-calorie malnutrition: Secondary | ICD-10-CM | POA: Diagnosis not present

## 2023-03-17 DIAGNOSIS — J9611 Chronic respiratory failure with hypoxia: Secondary | ICD-10-CM | POA: Diagnosis not present

## 2023-03-17 DIAGNOSIS — R911 Solitary pulmonary nodule: Secondary | ICD-10-CM | POA: Diagnosis not present

## 2023-03-17 DIAGNOSIS — I10 Essential (primary) hypertension: Secondary | ICD-10-CM

## 2023-03-17 DIAGNOSIS — J449 Chronic obstructive pulmonary disease, unspecified: Secondary | ICD-10-CM

## 2023-03-17 DIAGNOSIS — K219 Gastro-esophageal reflux disease without esophagitis: Secondary | ICD-10-CM

## 2023-03-17 LAB — CBC
HCT: 32.3 % — ABNORMAL LOW (ref 39.0–52.0)
Hemoglobin: 9.9 g/dL — ABNORMAL LOW (ref 13.0–17.0)
MCH: 26.3 pg (ref 26.0–34.0)
MCHC: 30.7 g/dL (ref 30.0–36.0)
MCV: 85.7 fL (ref 80.0–100.0)
Platelets: 157 10*3/uL (ref 150–400)
RBC: 3.77 MIL/uL — ABNORMAL LOW (ref 4.22–5.81)
RDW: 16.7 % — ABNORMAL HIGH (ref 11.5–15.5)
WBC: 10.6 10*3/uL — ABNORMAL HIGH (ref 4.0–10.5)
nRBC: 0 % (ref 0.0–0.2)

## 2023-03-17 LAB — BPAM RBC
Blood Product Expiration Date: 202412302359
ISSUE DATE / TIME: 202412071352
Unit Type and Rh: 6200

## 2023-03-17 LAB — TYPE AND SCREEN
ABO/RH(D): A POS
Antibody Screen: NEGATIVE
Unit division: 0

## 2023-03-17 LAB — VITAMIN B12: Vitamin B-12: 388 pg/mL (ref 180–914)

## 2023-03-17 LAB — BASIC METABOLIC PANEL
Anion gap: 7 (ref 5–15)
BUN: 29 mg/dL — ABNORMAL HIGH (ref 8–23)
CO2: 22 mmol/L (ref 22–32)
Calcium: 7.9 mg/dL — ABNORMAL LOW (ref 8.9–10.3)
Chloride: 110 mmol/L (ref 98–111)
Creatinine, Ser: 1.46 mg/dL — ABNORMAL HIGH (ref 0.61–1.24)
GFR, Estimated: 49 mL/min — ABNORMAL LOW (ref >60)
Glucose, Bld: 100 mg/dL — ABNORMAL HIGH (ref 70–99)
Potassium: 4.1 mmol/L (ref 3.5–5.1)
Sodium: 139 mmol/L (ref 135–145)

## 2023-03-17 LAB — FOLATE: Folate: 19.7 ng/mL (ref >5.9)

## 2023-03-17 LAB — RETICULOCYTES
Immature Retic Fract: 25.3 % — ABNORMAL HIGH (ref 2.3–15.9)
RBC.: 3.73 MIL/uL — ABNORMAL LOW (ref 4.22–5.81)
Retic Count, Absolute: 97 10*3/uL (ref 19.0–186.0)
Retic Ct Pct: 2.6 % (ref 0.4–3.1)

## 2023-03-17 LAB — FERRITIN: Ferritin: 90 ng/mL (ref 24–336)

## 2023-03-17 LAB — IRON AND TIBC
Iron: 58 ug/dL (ref 45–182)
Saturation Ratios: 23 % (ref 17.9–39.5)
TIBC: 250 ug/dL (ref 250–450)
UIBC: 192 ug/dL

## 2023-03-17 MED ORDER — METOPROLOL TARTRATE 25 MG PO TABS
25.0000 mg | ORAL_TABLET | Freq: Two times a day (BID) | ORAL | Status: DC
  Administered 2023-03-17: 25 mg via ORAL
  Filled 2023-03-17: qty 1

## 2023-03-17 MED ORDER — IPRATROPIUM-ALBUTEROL 0.5-2.5 (3) MG/3ML IN SOLN
3.0000 mL | Freq: Four times a day (QID) | RESPIRATORY_TRACT | Status: DC
Start: 2023-03-17 — End: 2023-03-18
  Administered 2023-03-17 – 2023-03-18 (×5): 3 mL via RESPIRATORY_TRACT
  Filled 2023-03-17 (×5): qty 3

## 2023-03-17 MED ORDER — METOPROLOL TARTRATE 50 MG PO TABS
50.0000 mg | ORAL_TABLET | Freq: Two times a day (BID) | ORAL | Status: DC
  Administered 2023-03-17 – 2023-03-18 (×2): 50 mg via ORAL
  Filled 2023-03-17 (×2): qty 1

## 2023-03-17 MED ORDER — SUCRALFATE 1 GM/10ML PO SUSP
1.0000 g | Freq: Three times a day (TID) | ORAL | Status: DC
  Administered 2023-03-17 – 2023-03-18 (×4): 1 g via ORAL
  Filled 2023-03-17 (×4): qty 10

## 2023-03-17 MED ORDER — ALUM & MAG HYDROXIDE-SIMETH 200-200-20 MG/5ML PO SUSP
30.0000 mL | ORAL | Status: DC | PRN
  Administered 2023-03-17: 30 mL via ORAL
  Filled 2023-03-17: qty 30

## 2023-03-17 MED ORDER — PROCHLORPERAZINE EDISYLATE 10 MG/2ML IJ SOLN: 10.0000 mg | Freq: Four times a day (QID) | INTRAMUSCULAR | Status: DC | PRN

## 2023-03-17 NOTE — Progress Notes (Signed)
   03/17/23 0933  Vitals  BP (!) 141/75  Pulse Rate (!) 125  MEWS COLOR  MEWS Score Color Yellow  MEWS Score  MEWS Temp 0  MEWS Systolic 0  MEWS Pulse 2  MEWS RR 0  MEWS LOC 0  MEWS Score 2   Patient was given scheduled metoprolol. MD [facility] notified.

## 2023-03-17 NOTE — Plan of Care (Signed)

## 2023-03-17 NOTE — Progress Notes (Signed)
   03/17/23 1700  Vitals  Temp (!) 103 F (39.4 C) (told nurse temp up)  Temp Source Oral  BP (!) 142/63  BP Location Right Arm  BP Method Automatic  Patient Position (if appropriate) Lying  Pulse Rate (!) 103  Pulse Rate Source Dinamap  Resp 20  MEWS COLOR  MEWS Score Color Yellow  Oxygen Therapy  SpO2 100 %  O2 Device Nasal Cannula  O2 Flow Rate (L/min) 4 L/min  MEWS Score  MEWS Temp 2  MEWS Systolic 0  MEWS Pulse 1  MEWS RR 0  MEWS LOC 0  MEWS Score 3   Patient was given tylenol and MD [place] notified.

## 2023-03-17 NOTE — Progress Notes (Signed)
Patient states that pain in his chest that was a complaint of heart burn around lunch is now in the right side of his chest. MD [facility] notified.

## 2023-03-17 NOTE — Progress Notes (Signed)
PROGRESS NOTE    Stephen Fitzpatrick  ZOX:[id number] DOB: 1945-09-24 DOA: 03/15/2023 PCP: Rebekah Chesterfield, NP   Brief Narrative: 77 year old with past medical history significant for carcinoma of the lung, CHF, COPD, GERD, anxiety, chronic hypoxic respiratory failure on 4 L of oxygen, undergoing workup at [facility] oncology for osteoblastic lesion with concern for lung versus prostate malignancy pending PET scan.  Presents from his facility with abnormal labs hemoglobin 7.3, white blood cell 17, progressive weakness and fatigue poor oral intake.  Evaluation in the ED he was found to have a creatinine of 3.19.  Patient admitted with anemia and AKI and failure to thrive.      Assessment & Plan:   Principal Problem:   AKI (acute kidney injury) (HCC) Active Problems:   Nodule of left lower lung   Adenocarcinoma of Left Lower Lobe lung   COPD (chronic obstructive pulmonary disease) (HCC)   Chronic respiratory failure with hypoxia (HCC)   HTN (hypertension)   History of cerebrovascular accident (CVA) due to ischemia   GERD (gastroesophageal reflux disease)   Protein-calorie malnutrition, severe     1-AKI: -Patient presents with elevated creatinine 3.1, previous normal creatinine, poor oral intake.  - AKI in the setting of ACE use, hypovolemia, and Celebrex -Continue to monitor for urine retention with bladder scan. -Continue with IV fluids -Continue Flomax -Creatinine down to 1.46 -Continue to follow renal function trend.   2-Pulmonary Nodule Osteoblastic Lesion.  -Needs to continue follow with [facility].   3-HTN:  -Blood pressure stabilizing after fluid resuscitation -Will resume the use of metoprolol.  GERD; nausea/vomiting. -Continue PPI twice a day and the use of Carafate -As needed Compazine has been added to assist with nausea/vomiting  Chronic constipation:  -Continue with laxative -Continue to maintain adequate hydration.  Chronic hypoxic respiratory  failure, chronic COPD Continue 3-4 L of oxygen at baseline -Continue Trelegy, DuoNebs as needed.   -Continue continue theophylline  Failure to thrive, weakness deconditioning -Poor oral intake. -Feeding supplement -Will follow any further recommendations by dietitian service -PT OT consulted,   Anemia:  -Hemoglobin 9.9 after 1 unit PRBC was transfused  -No overt bleeding appreciated.   Leukocytosis: resolved -Continue to follow WBCs trend.   DVT prophylaxis: Heparin Code Status: Full code Family Communication: Care discussed with patient no family at bedside Disposition Plan:  Status is: Inpatient Remains inpatient appropriate because: Management of AKI    Consultants:  none  Procedures:  none  Antimicrobials:    Subjective: Chronically ill in appearance; underweight.  Reporting ongoing nausea/vomiting epigastric.  Having trouble tolerating diet.  Objective: Vitals:   03/17/23 1000 03/17/23 1041 03/17/23 1235 03/17/23 1434  BP:  131/78 136/75   Pulse: (!) 106 (!) 120    Resp:   17   Temp:   98.6 F (37 C)   TempSrc:      SpO2:   91% 95%  Weight:      Height:        Intake/Output Summary (Last 24 hours) at 03/17/2023 1609 Last data filed at 03/17/2023 1244 Gross per 24 hour  Intake 464 ml  Output 825 ml  Net -361 ml   Filed Weights   03/15/23 1553  Weight: 64.5 kg    Examination: General exam: Alert, awake, oriented x 3; underweight and chronically ill in appearance; reports ongoing nausea/vomiting and inability to maintain adequate oral intake.  Patient is reporting mid chest burning pain. Respiratory system: No using accessory muscle.  Normal respiratory effort.  Good saturation on 3 L supplementation. Cardiovascular system: No rubs, no gallops, no JVD. Gastrointestinal system: Abdomen is nondistended, soft and without going completely midepigastric region. Central nervous system: No focal neurological deficits. Extremities: No cyanosis or  clubbing. Skin: No rashes, no petechiae. Psychiatry: Judgement and insight appear normal. Mood & affect appropriate.    Data Reviewed: I have personally reviewed following labs and imaging studies  CBC: Recent Labs  Lab 03/15/23 1557 03/16/23 0358 03/17/23 0511  WBC 12.6* 8.5 10.6*  NEUTROABS 9.7*  --   --   HGB 8.7* 7.0* 9.9*  HCT 29.0* 23.7* 32.3*  MCV 86.1 87.8 85.7  PLT 273 186 157   Basic Metabolic Panel: Recent Labs  Lab 03/15/23 1557 03/16/23 0358 03/17/23 0511  NA 137 139 139  K 4.9 4.3 4.1  CL 107 113* 110  CO2 23 19* 22  GLUCOSE 84 106* 100*  BUN 61* 43* 29*  CREATININE 3.15* 2.14* 1.46*  CALCIUM 8.6* 7.9* 7.9*   GFR: Estimated Creatinine Clearance: 38 mL/min (A) (by C-G formula based on SCr of 1.46 mg/dL (H)).  Liver Function Tests: Recent Labs  Lab 03/15/23 1557  AST 19  ALT 13  ALKPHOS 54  BILITOT 0.4  PROT 6.3*  ALBUMIN 3.0*   Anemia Panel: Recent Labs    03/17/23 0511  VITAMINB12 388  FOLATE 19.7  FERRITIN 90  TIBC 250  IRON 58  RETICCTPCT 2.6   Sepsis Labs: No results for input(s): "PROCALCITON", "LATICACIDVEN" in the last 168 hours.  Recent Results (from the past 240 hour(s))  Resp panel by RT-PCR (RSV, Flu A&B, Covid) Anterior Nasal Swab     Status: None   Collection Time: 03/15/23  4:06 PM   Specimen: Anterior Nasal Swab  Result Value Ref Range Status   SARS Coronavirus 2 by RT PCR NEGATIVE NEGATIVE Final    Comment: (NOTE) SARS-CoV-2 target nucleic acids are NOT DETECTED.  The SARS-CoV-2 RNA is generally detectable in upper respiratory specimens during the acute phase of infection. The lowest concentration of SARS-CoV-2 viral copies this assay can detect is 138 copies/mL. A negative result does not preclude SARS-Cov-2 infection and should not be used as the sole basis for treatment or other patient management decisions. A negative result may occur with  improper specimen collection/handling, submission of specimen  other than nasopharyngeal swab, presence of viral mutation(s) within the areas targeted by this assay, and inadequate number of viral copies(<138 copies/mL). A negative result must be combined with clinical observations, patient history, and epidemiological information. The expected result is Negative.  Fact Sheet for Patients:  BloggerCourse.com  Fact Sheet for Healthcare Providers:  SeriousBroker.it  This test is no t yet approved or cleared by the [place] FDA and  has been authorized for detection and/or diagnosis of SARS-CoV-2 by FDA under an Emergency Use Authorization (EUA). This EUA will remain  in effect (meaning this test can be used) for the duration of the COVID-19 declaration under Section 564(b)(1) of the Act, 21 U.S.C.section 360bbb-3(b)(1), unless the authorization is terminated  or revoked sooner.       Influenza A by PCR NEGATIVE NEGATIVE Final   Influenza B by PCR NEGATIVE NEGATIVE Final    Comment: (NOTE) The Xpert Xpress SARS-CoV-2/FLU/RSV plus assay is intended as an aid in the diagnosis of influenza from Nasopharyngeal swab specimens and should not be used as a sole basis for treatment. Nasal washings and aspirates are unacceptable for Xpert Xpress SARS-CoV-2/FLU/RSV testing.  Fact Sheet for Patients:  BloggerCourse.com  Fact Sheet for Healthcare Providers: SeriousBroker.it  This test is not yet approved or cleared by the [place] FDA and has been authorized for detection and/or diagnosis of SARS-CoV-2 by FDA under an Emergency Use Authorization (EUA). This EUA will remain in effect (meaning this test can be used) for the duration of the COVID-19 declaration under Section 564(b)(1) of the Act, 21 U.S.C. section 360bbb-3(b)(1), unless the authorization is terminated or revoked.     Resp Syncytial Virus by PCR NEGATIVE NEGATIVE Final     Comment: (NOTE) Fact Sheet for Patients: BloggerCourse.com  Fact Sheet for Healthcare Providers: SeriousBroker.it  This test is not yet approved or cleared by the [place] FDA and has been authorized for detection and/or diagnosis of SARS-CoV-2 by FDA under an Emergency Use Authorization (EUA). This EUA will remain in effect (meaning this test can be used) for the duration of the COVID-19 declaration under Section 564(b)(1) of the Act, 21 U.S.C. section 360bbb-3(b)(1), unless the authorization is terminated or revoked.  Performed at [facility], [address], [place], [place] [address]      Radiology Studies: [facility] Chest [facility] 1 View  Result Date: 03/15/2023 CLINICAL DATA:  Shortness of breath, lung cancer EXAM: PORTABLE CHEST 1 VIEW COMPARISON:  01/09/2023 FINDINGS: Postprocedural changes with volume loss in the left hemithorax. Associated left lung scarring/radiation changes, unchanged. Dominant bulla in the left upper lobe. Mild left basilar opacity favors scarring over infection/pneumonia. 14 mm nodular opacity at the right lung base, poorly evaluated. Mild right basilar atelectasis. No pleural effusion or pneumothorax. The heart is normal in size. IMPRESSION: Postprocedural changes with volume loss in the left hemithorax. Associated left lung scarring/radiation changes, unchanged. 14 mm nodular opacity at the right lung base, poorly evaluated. Chest CT is suggested for further evaluation. Electronically Signed   By: Charline Bills M.D.   On: 03/15/2023 17:42    Scheduled Meds:  cholecalciferol  2,000 Units Oral Daily   feeding supplement  237 mL Oral BID BM   fluticasone  1-2 spray Each Nare Daily   fluticasone furoate-vilanterol  1 puff Inhalation Daily   And   umeclidinium bromide  1 puff Inhalation Daily   heparin  5,000 Units Subcutaneous Q8H   ipratropium-albuterol  3 mL Nebulization Q6H   metoprolol tartrate  50 mg  Oral BID   mirtazapine  30 mg Oral QHS   pantoprazole  40 mg Oral BID AC   polyethylene glycol  17 g Oral BID   sucralfate  1 g Oral TID   tamsulosin  0.4 mg Oral Daily   theophylline  400 mg Oral Daily   Continuous Infusions:  sodium chloride 75 mL/hr at 03/15/23 2147     LOS: 2 days    Time spent: 50 minutes    Vassie Loll, MD [facility]   If 7PM-7AM, please contact night-coverage [email or web address]  03/17/2023, 4:09 PM

## 2023-03-17 NOTE — Progress Notes (Signed)
Patient is complaining of heartburn. MD Gwenlyn Perking ordered maalox to give.

## 2023-03-17 NOTE — Progress Notes (Signed)
   03/17/23 1041  Vitals  BP 131/78  MAP (mmHg) 95  Pulse Rate (!) 120  MEWS COLOR  MEWS Score Color Yellow  MEWS Score  MEWS Temp 0  MEWS Systolic 0  MEWS Pulse 2  MEWS RR 0  MEWS LOC 0  MEWS Score 2   MD [place] aware that hr is still elevated after PO metoprolol

## 2023-03-18 DIAGNOSIS — I10 Essential (primary) hypertension: Secondary | ICD-10-CM | POA: Diagnosis not present

## 2023-03-18 DIAGNOSIS — C3490 Malignant neoplasm of unspecified part of unspecified bronchus or lung: Secondary | ICD-10-CM

## 2023-03-18 DIAGNOSIS — Z8673 Personal history of transient ischemic attack (TIA), and cerebral infarction without residual deficits: Secondary | ICD-10-CM

## 2023-03-18 DIAGNOSIS — J449 Chronic obstructive pulmonary disease, unspecified: Secondary | ICD-10-CM | POA: Diagnosis not present

## 2023-03-18 DIAGNOSIS — N179 Acute kidney failure, unspecified: Secondary | ICD-10-CM | POA: Diagnosis not present

## 2023-03-18 LAB — BASIC METABOLIC PANEL
Anion gap: 6 (ref 5–15)
BUN: 21 mg/dL (ref 8–23)
CO2: 22 mmol/L (ref 22–32)
Calcium: 7.7 mg/dL — ABNORMAL LOW (ref 8.9–10.3)
Chloride: 111 mmol/L (ref 98–111)
Creatinine, Ser: 1.2 mg/dL (ref 0.61–1.24)
GFR, Estimated: >60 mL/min (ref >60)
Glucose, Bld: 92 mg/dL (ref 70–99)
Potassium: 4.2 mmol/L (ref 3.5–5.1)
Sodium: 139 mmol/L (ref 135–145)

## 2023-03-18 MED ORDER — ENSURE ENLIVE PO LIQD: 237.0000 mL | Freq: Two times a day (BID) | ORAL | 12 refills | Status: DC

## 2023-03-18 MED ORDER — TAMSULOSIN HCL 0.4 MG PO CAPS: 0.4000 mg | ORAL_CAPSULE | Freq: Every day | ORAL | 1 refills | Status: DC

## 2023-03-18 MED ORDER — ALPRAZOLAM 0.5 MG PO TABS: 0.5000 mg | ORAL_TABLET | Freq: Two times a day (BID) | ORAL | 0 refills | Status: DC | PRN

## 2023-03-18 MED ORDER — PANTOPRAZOLE SODIUM 40 MG PO TBEC: 40.0000 mg | DELAYED_RELEASE_TABLET | Freq: Two times a day (BID) | ORAL | 1 refills | Status: DC

## 2023-03-18 MED ORDER — PROCHLORPERAZINE MALEATE 10 MG PO TABS: 10.0000 mg | ORAL_TABLET | Freq: Four times a day (QID) | ORAL | 0 refills | Status: DC | PRN

## 2023-03-18 MED ORDER — SUCRALFATE 1 GM/10ML PO SUSP: 1.0000 g | Freq: Three times a day (TID) | ORAL | 0 refills | Status: DC

## 2023-03-18 NOTE — Discharge Summary (Signed)
Physician Discharge Summary   Patient: Stephen Fitzpatrick MRN: [id number] DOB: 1945/09/28  Admit date:     03/15/2023  Discharge date: 03/18/23  Discharge Physician: Vassie Loll   PCP: Rebekah Chesterfield, NP   Recommendations at discharge:  CBC to follow hemoglobin trend/stability Repeat basic metabolic panel to follow electrolytes and renal function Reassess blood pressure and adjust antihypertensive treatment as needed Outpatient follow-up with oncology service as previously prescribed.  Discharge Diagnoses: Principal Problem:   AKI (acute kidney injury) (HCC) Active Problems:   Nodule of left lower lung   Adenocarcinoma of Left Lower Lobe lung   COPD (chronic obstructive pulmonary disease) (HCC)   Chronic respiratory failure with hypoxia (HCC)   HTN (hypertension)   History of cerebrovascular accident (CVA) due to ischemia   GERD (gastroesophageal reflux disease)   Protein-calorie malnutrition, severe  Brief Narrative: 77 year old with past medical history significant for carcinoma of the lung, CHF, COPD, GERD, anxiety, chronic hypoxic respiratory failure on 4 L of oxygen, undergoing workup at [facility] oncology for osteoblastic lesion with concern for lung versus prostate malignancy pending PET scan.  Presents from his facility with abnormal labs hemoglobin 7.3, white blood cell 17, progressive weakness and fatigue poor oral intake.  Evaluation in the ED he was found to have a creatinine of 3.19.   Patient admitted with anemia and AKI and failure to thrive.  Assessment and Plan:  1-AKI: -Patient presents with elevated creatinine 3.1, previous normal creatinine, poor oral intake.  - AKI in the setting of ACE use, hypovolemia, and Celebrex -Continue to monitor for urine retention with bladder scan. -Continue to maintain adequate hydration. -Continue Flomax -Creatinine down to 1.20 (normal range)  -Continue to follow renal function trend intermittently. -Continue  minimizing/avoiding nephrotoxic agents.    2-Pulmonary Nodule Osteoblastic Lesion.  -Needs to continue follow with [facility].    3-HTN:  -Blood pressure stabilizing after fluid resuscitation -Will resume the use of metoprolol. -At discharge still holding ACE inhibitors.   4-GERD; nausea/vomiting. -Continue PPI twice a day and the use of Carafate -As needed Compazine has been added to assist with nausea/vomiting   5-Chronic constipation:  -Continue with laxative -Continue to maintain adequate hydration.   6-Chronic hypoxic respiratory failure, chronic COPD Continue 3-4 L of oxygen at baseline -Continue Trelegy, DuoNebs as needed.   -Continue continue theophylline -Continue patient follow-up with pulmonology service.   7-Failure to thrive, weakness deconditioning -Poor oral intake. -Appreciate assistance and recommendation by PT/OT service -Patient will return back to skilled nursing facility for further care and rehabilitation. -Patient with complaining of severe protein calorie malnutrition -Body mass index is 17.31 kg/m.  -Continue feeding supplements and the use of Remeron.  To assist with appetite.   8-Anemia:  -Hemoglobin 9.9 after 1 unit PRBC was transfused  -No overt bleeding appreciated. -Continue to follow hemoglobin trend.   9-Leukocytosis: resolved -Continue to follow WBCs trend.  Consultants: None Procedures performed: See below for x-ray reports. Disposition: Skilled nursing facility Diet recommendation: Regular diet with thin liquids;  DISCHARGE MEDICATION: Allergies as of 03/18/2023   No Known Allergies      Medication List     STOP taking these medications    celecoxib 100 MG capsule Commonly known as: CELEBREX   lisinopril 10 MG tablet Commonly known as: ZESTRIL   omeprazole 40 MG capsule Commonly known as: PRILOSEC Replaced by: pantoprazole 40 MG tablet   ondansetron 4 MG tablet Commonly known as: Zofran   promethazine 25  MG/ML injection Commonly  known as: PHENERGAN   sulfamethoxazole-trimethoprim 800-160 MG tablet Commonly known as: BACTRIM DS       TAKE these medications    acetaminophen 325 MG tablet Commonly known as: TYLENOL Take 650 mg by mouth every 6 (six) hours as needed for mild pain.   albuterol 108 (90 Base) MCG/ACT inhaler Commonly known as: VENTOLIN HFA Inhale 2 puffs into the lungs every 6 (six) hours as needed for wheezing or shortness of breath.   ALPRAZolam 0.5 MG tablet Commonly known as: XANAX Take 1 tablet (0.5 mg total) by mouth every 12 (twelve) hours as needed for anxiety. For 14 days   Cholecalciferol 50 MCG (2000 UT) Tabs Take 1 tablet by mouth daily.   dextrose 5 % and 0.9% NaCl 5-0.9 % Inject 60 mLs into the vein once. 60 ml x2 liters via clysis. For 3 days.   docusate sodium 100 MG capsule Commonly known as: COLACE Take 100 mg by mouth daily as needed for mild constipation.   feeding supplement Liqd Take 237 mLs by mouth 2 (two) times daily between meals. Start taking on: March 19, 2023   fluticasone 50 MCG/ACT nasal spray Commonly known as: FLONASE Place 1-2 sprays into both nostrils daily.   ipratropium-albuterol 0.5-2.5 (3) MG/3ML Soln Commonly known as: DUONEB Take 3 mLs by nebulization in the morning and at bedtime.   ipratropium-albuterol 0.5-2.5 (3) MG/3ML Soln Commonly known as: DUONEB Take 3 mLs by nebulization every 6 (six) hours as needed (respiratory disease/disorder give as needed).   Menthol-Zinc Oxide 0.44-20.625 % Oint Apply 1 Application topically in the morning, at noon, and at bedtime. To buttocks   metoprolol tartrate 50 MG tablet Commonly known as: LOPRESSOR Take 1 tablet (50 mg total) by mouth 2 (two) times daily. What changed: when to take this   mirtazapine 30 MG tablet Commonly known as: REMERON Take 30 mg by mouth at bedtime.   multivitamin with minerals Tabs tablet Take 1 tablet by mouth daily.   OXYGEN Inhale 4  L into the lungs continuous. Every hour   pantoprazole 40 MG tablet Commonly known as: PROTONIX Take 1 tablet (40 mg total) by mouth 2 (two) times daily. Replaces: omeprazole 40 MG capsule   polyethylene glycol 17 g packet Commonly known as: MIRALAX / GLYCOLAX Take 17 g by mouth daily.   prochlorperazine 10 MG tablet Commonly known as: COMPAZINE Take 1 tablet (10 mg total) by mouth every 6 (six) hours as needed for nausea or vomiting.   sucralfate 1 GM/10ML suspension Commonly known as: CARAFATE Take 10 mLs (1 g total) by mouth 3 (three) times daily.   tamsulosin 0.4 MG Caps capsule Commonly known as: FLOMAX Take 1 capsule (0.4 mg total) by mouth daily. Start taking on: March 19, 2023   theophylline 200 MG 12 hr tablet Commonly known as: THEODUR Take 200 mg by mouth 2 (two) times daily.   Trelegy Ellipta 100-62.5-25 MCG/ACT Aepb Generic drug: Fluticasone-Umeclidin-Vilant Inhale 1 puff into the lungs daily.        Discharge Exam: Filed Weights   03/15/23 1553  Weight: 64.5 kg   General exam: Alert, awake, oriented x 3; underweight and chronically ill in appearance; reports no further nausea vomiting and significant improvement in his reflux symptoms.  Patient in no acute distress. Respiratory system: No using accessory muscle.  Normal respiratory effort.  Good saturation on chronic 3 L supplementation. Cardiovascular system: No rubs, no gallops, no JVD. Gastrointestinal system: Abdomen is nondistended, soft and without going completely  midepigastric region. Central nervous system: No focal neurological deficits. Extremities: No cyanosis or clubbing. Skin: No rashes, no petechiae. Psychiatry: Judgement and insight appear normal. Mood & affect appropriate.   Condition at discharge: stable  The results of significant diagnostics from this hospitalization (including imaging, microbiology, ancillary and laboratory) are listed below for reference.   Imaging  Studies: DG Chest Port 1 View  Result Date: 03/15/2023 CLINICAL DATA:  Shortness of breath, lung cancer EXAM: PORTABLE CHEST 1 VIEW COMPARISON:  01/09/2023 FINDINGS: Postprocedural changes with volume loss in the left hemithorax. Associated left lung scarring/radiation changes, unchanged. Dominant bulla in the left upper lobe. Mild left basilar opacity favors scarring over infection/pneumonia. 14 mm nodular opacity at the right lung base, poorly evaluated. Mild right basilar atelectasis. No pleural effusion or pneumothorax. The heart is normal in size. IMPRESSION: Postprocedural changes with volume loss in the left hemithorax. Associated left lung scarring/radiation changes, unchanged. 14 mm nodular opacity at the right lung base, poorly evaluated. Chest CT is suggested for further evaluation. Electronically Signed   By: Charline Bills M.D.   On: 03/15/2023 17:42    Microbiology: Results for orders placed or performed during the hospital encounter of 03/15/23  Resp panel by RT-PCR (RSV, Flu A&B, Covid) Anterior Nasal Swab     Status: None   Collection Time: 03/15/23  4:06 PM   Specimen: Anterior Nasal Swab  Result Value Ref Range Status   SARS Coronavirus 2 by RT PCR NEGATIVE NEGATIVE Final    Comment: (NOTE) SARS-CoV-2 target nucleic acids are NOT DETECTED.  The SARS-CoV-2 RNA is generally detectable in upper respiratory specimens during the acute phase of infection. The lowest concentration of SARS-CoV-2 viral copies this assay can detect is 138 copies/mL. A negative result does not preclude SARS-Cov-2 infection and should not be used as the sole basis for treatment or other patient management decisions. A negative result may occur with  improper specimen collection/handling, submission of specimen other than nasopharyngeal swab, presence of viral mutation(s) within the areas targeted by this assay, and inadequate number of viral copies(<138 copies/mL). A negative result must be combined  with clinical observations, patient history, and epidemiological information. The expected result is Negative.  Fact Sheet for Patients:  BloggerCourse.com  Fact Sheet for Healthcare Providers:  SeriousBroker.it  This test is no t yet approved or cleared by the [place] FDA and  has been authorized for detection and/or diagnosis of SARS-CoV-2 by FDA under an Emergency Use Authorization (EUA). This EUA will remain  in effect (meaning this test can be used) for the duration of the COVID-19 declaration under Section 564(b)(1) of the Act, 21 U.S.C.section 360bbb-3(b)(1), unless the authorization is terminated  or revoked sooner.       Influenza A by PCR NEGATIVE NEGATIVE Final   Influenza B by PCR NEGATIVE NEGATIVE Final    Comment: (NOTE) The Xpert Xpress SARS-CoV-2/FLU/RSV plus assay is intended as an aid in the diagnosis of influenza from Nasopharyngeal swab specimens and should not be used as a sole basis for treatment. Nasal washings and aspirates are unacceptable for Xpert Xpress SARS-CoV-2/FLU/RSV testing.  Fact Sheet for Patients: BloggerCourse.com  Fact Sheet for Healthcare Providers: SeriousBroker.it  This test is not yet approved or cleared by the [place] FDA and has been authorized for detection and/or diagnosis of SARS-CoV-2 by FDA under an Emergency Use Authorization (EUA). This EUA will remain in effect (meaning this test can be used) for the duration of the COVID-19 declaration under Section 564(b)(1)  of the Act, 21 U.S.C. section 360bbb-3(b)(1), unless the authorization is terminated or revoked.     Resp Syncytial Virus by PCR NEGATIVE NEGATIVE Final    Comment: (NOTE) Fact Sheet for Patients: BloggerCourse.com  Fact Sheet for Healthcare Providers: SeriousBroker.it  This test is not yet approved  or cleared by the [place] FDA and has been authorized for detection and/or diagnosis of SARS-CoV-2 by FDA under an Emergency Use Authorization (EUA). This EUA will remain in effect (meaning this test can be used) for the duration of the COVID-19 declaration under Section 564(b)(1) of the Act, 21 U.S.C. section 360bbb-3(b)(1), unless the authorization is terminated or revoked.  Performed at [facility], [address], [place], [place] [address]     Labs: CBC: Recent Labs  Lab 03/15/23 1557 03/16/23 0358 03/17/23 0511  WBC 12.6* 8.5 10.6*  NEUTROABS 9.7*  --   --   HGB 8.7* 7.0* 9.9*  HCT 29.0* 23.7* 32.3*  MCV 86.1 87.8 85.7  PLT 273 186 157   Basic Metabolic Panel: Recent Labs  Lab 03/15/23 1557 03/16/23 0358 03/17/23 0511 03/18/23 0417  NA 137 139 139 139  K 4.9 4.3 4.1 4.2  CL 107 113* 110 111  CO2 23 19* 22 22  GLUCOSE 84 106* 100* 92  BUN 61* 43* 29* 21  CREATININE 3.15* 2.14* 1.46* 1.20  CALCIUM 8.6* 7.9* 7.9* 7.7*   Liver Function Tests: Recent Labs  Lab 03/15/23 1557  AST 19  ALT 13  ALKPHOS 54  BILITOT 0.4  PROT 6.3*  ALBUMIN 3.0*   CBG: No results for input(s): "GLUCAP" in the last 168 hours.  Discharge time spent: greater than 30 minutes.  Signed: Vassie Loll, MD [facility] 03/18/2023

## 2023-03-18 NOTE — Evaluation (Signed)
Physical Therapy Evaluation Patient Details Name: Stephen Fitzpatrick MRN: [id number] DOB: [birth year] Today's Date: 03/18/2023  History of Present Illness  Stephen Fitzpatrick  is a 77 y.o. male,  with medical history significant of carcinoma of the lung, CHF, COPD, GERD, anxiety, chronic respiratory failure on 4 L nasal cannula at baseline, active workup with [facility]ology for osteoplastic lesions with concern for lung versus prostate malignancy pending PET scan.    -Patient was sent from his facility for weakness, and abnormal labs) it states hemoglobin 7.3, and white blood cell count of 17, but this lab appointment reputation showing white blood cell count of 12, and hemoglobin of 8.7) patient has been in subacute rehab at [facility] for last 3 weeks, over last few days, patient reports progressive weakness, fatigue, and some difficulty with urination, incontinence, he does endorse weight loss, poor appetite as well he denies fever, chills, chest pain.  -In ED he was noticed with soft blood pressure, lowest was 94/75, as well he was noted to have elevated creatinine at 3.15 from normal baseline, albumin was low at 3, hemoglobin around baseline of 8.7, patient appears to be clinically dehydrated, So [facility] consulted to admit   Clinical Impression  Patient demonstrates slow labored movement for sitting up at bedside requiring HOB raised and use of bed rail, labored movement for completing sit to stands and limited to a few side steps at bedside before having to sit due to c/o fatigue and BLE weakness.  Patient tolerated sitting up in chair after therapy with his son present.  Patient will benefit from continued skilled physical therapy in hospital and recommended venue below to increase strength, balance, endurance for safe ADLs and gait.           If plan is discharge home, recommend the following: A lot of help with bathing/dressing/bathroom;A lot of help with walking and/or transfers;Help  with stairs or ramp for entrance;Assistance with cooking/housework   Can travel by private vehicle   No    Equipment Recommendations None recommended by PT  Recommendations for Other Services       Functional Status Assessment Patient has had a recent decline in their functional status and demonstrates the ability to make significant improvements in function in a reasonable and predictable amount of time.     Precautions / Restrictions Precautions Precautions: Fall Restrictions Weight Bearing Restrictions: No      Mobility  Bed Mobility Overal bed mobility: Needs Assistance Bed Mobility: Supine to Sit     Supine to sit: Min assist, HOB elevated     General bed mobility comments: increased time, labored movement    Transfers Overall transfer level: Needs assistance Equipment used: Rolling walker (2 wheels) Transfers: Sit to/from Stand, Bed to chair/wheelchair/BSC Sit to Stand: Min assist, Mod assist   Step pivot transfers: Mod assist       General transfer comment: slow labored movement, easily fatigued    Ambulation/Gait Ambulation/Gait assistance: Mod assist Gait Distance (Feet): 4 Feet Assistive device: Rolling walker (2 wheels) Gait Pattern/deviations: Decreased step length - right, Decreased step length - left, Decreased stride length Gait velocity: slow     General Gait Details: limited to a few slow labored side steps before having to sit due to fatigue and SOB  Stairs            Wheelchair Mobility     Tilt Bed    Modified Rankin (Stroke Patients Only)       Balance Overall  balance assessment: Needs assistance Sitting-balance support: Feet supported, Bilateral upper extremity supported Sitting balance-Leahy Scale: Fair Sitting balance - Comments: fair/good seated at EOB   Standing balance support: Reliant on assistive device for balance, During functional activity, Bilateral upper extremity supported Standing balance-Leahy Scale:  Poor Standing balance comment: fair/poor using RW                             Pertinent Vitals/Pain Pain Assessment Pain Assessment: No/denies pain    Home Living Family/patient expects to be discharged to:: Private residence Living Arrangements: Children Available Help at Discharge: Family;Available 24 hours/day Type of Home: Mobile home Home Access: Stairs to enter Entrance Stairs-Rails: Right;Left;Can reach both Entrance Stairs-Number of Steps: 6   Home Layout: One level Home Equipment: Agricultural consultant (2 wheels);Shower seat;Cane - single point;Grab bars - toilet;Grab bars - tub/shower      Prior Function Prior Level of Function : Needs assist       Physical Assist : Mobility (physical);ADLs (physical)     Mobility Comments: household distances with RW ADLs Comments: Independent with basic ADLs, community ADLs     Extremity/Trunk Assessment   Upper Extremity Assessment Upper Extremity Assessment: Defer to OT evaluation    Lower Extremity Assessment Lower Extremity Assessment: Generalized weakness    Cervical / Trunk Assessment Cervical / Trunk Assessment: Kyphotic  Communication   Communication Communication: Hearing impairment Cueing Techniques: Verbal cues;Tactile cues  Cognition Arousal: Alert Behavior During Therapy: WFL for tasks assessed/performed Overall Cognitive Status: Within Functional Limits for tasks assessed                                          General Comments      Exercises     Assessment/Plan    PT Assessment Patient needs continued PT services  PT Problem List Decreased strength;Decreased activity tolerance;Decreased balance;Decreased mobility       PT Treatment Interventions DME instruction;Gait training;Stair training;Functional mobility training;Therapeutic activities;Therapeutic exercise;Balance training;Patient/family education    PT Goals (Current goals can be found in the Care Plan  section)  Acute Rehab PT Goals Patient Stated Goal: return home after rehab PT Goal Formulation: With patient/family Time For Goal Achievement: 04/01/23 Potential to Achieve Goals: Good    Frequency Min 3X/week     Co-evaluation               AM-PAC PT "6 Clicks" Mobility  Outcome Measure Help needed turning from your back to your side while in a flat bed without using bedrails?: A Little Help needed moving from lying on your back to sitting on the side of a flat bed without using bedrails?: A Lot Help needed moving to and from a bed to a chair (including a wheelchair)?: A Lot Help needed standing up from a chair using your arms (e.g., wheelchair or bedside chair)?: A Lot Help needed to walk in hospital room?: A Lot Help needed climbing 3-5 steps with a railing? : Total 6 Click Score: 12    End of Session Equipment Utilized During Treatment: Oxygen Activity Tolerance: Patient tolerated treatment well;Patient limited by fatigue Patient left: in chair;with call bell/phone within reach Nurse Communication: Mobility status PT Visit Diagnosis: Other abnormalities of gait and mobility (R26.89);Unsteadiness on feet (R26.81);Muscle weakness (generalized) (M62.81)    Time: 6045-4098 PT Time Calculation (min) (ACUTE ONLY): 25 min  Charges:   PT Evaluation $PT Eval Moderate Complexity: 1 Mod PT Treatments $Therapeutic Activity: 23-37 mins PT General Charges $$ ACUTE PT VISIT: 1 Visit         12:16 PM, 03/18/23 Ocie Bob, MPT Physical Therapist with [facility] [phone] office [phone] mobile phone

## 2023-03-18 NOTE — Plan of Care (Signed)

## 2023-03-18 NOTE — Care Management Important Message (Signed)
Important Message  Patient Details  Name: Stephen Fitzpatrick MRN: [id number] Date of Birth: 12/26/1945   Important Message Given:  Yes - Medicare IM     Corey Harold 03/18/2023, 11:29 AM

## 2023-03-18 NOTE — Plan of Care (Signed)
  Problem: Acute Rehab PT Goals(only PT should resolve) Goal: Pt Will Go Supine/Side To Sit Outcome: Progressing Flowsheets (Taken 03/18/2023 1217) Pt will go Supine/Side to Sit:  with supervision  with contact guard assist Goal: Patient Will Transfer Sit To/From Stand Outcome: Progressing Flowsheets (Taken 03/18/2023 1217) Patient will transfer sit to/from stand: with minimal assist Goal: Pt Will Transfer Bed To Chair/Chair To Bed Outcome: Progressing Flowsheets (Taken 03/18/2023 1217) Pt will Transfer Bed to Chair/Chair to Bed: with min assist Goal: Pt Will Ambulate Outcome: Progressing Flowsheets (Taken 03/18/2023 1217) Pt will Ambulate:  15 feet  with minimal assist  with moderate assist  with rolling walker   12:18 PM, 03/18/23 Ocie Bob, MPT Physical Therapist with [facility] [phone] office [phone] mobile phone

## 2023-03-18 NOTE — [place] FL2 (Signed)
[place] MEDICAID FL2 LEVEL OF CARE FORM     IDENTIFICATION  Patient Name: Stephen Fitzpatrick Birthdate: 1946-01-05 Sex: male Admission Date (Current Location): 03/15/2023  [facility] and [place] Number:  [employer] and Address:  [facility],  [address], [place] [address]      Provider Number: [id number]  Attending Physician Name and Address:  Vassie Loll, MD  Relative Name and Phone Number:       Current Level of Care: Hospital Recommended Level of Care: Skilled Nursing Facility Prior Approval Number:    Date Approved/Denied:   PASRR Number:    Discharge Plan: SNF    Current Diagnoses: Patient Active Problem List   Diagnosis Date Noted   AKI (acute kidney injury) (HCC) 03/15/2023   Hydronephrosis 01/04/2023   GERD (gastroesophageal reflux disease) 01/04/2023   Protein-calorie malnutrition, severe 01/04/2023   SIRS (systemic inflammatory response syndrome) (HCC) 01/03/2023   Closed left hip fracture (HCC) 10/13/2021   COPD (chronic obstructive pulmonary disease) (HCC) 10/13/2021   Anxiety 10/13/2021   Chronic respiratory failure with hypoxia (HCC) 10/13/2021   HTN (hypertension) 10/13/2021   Former tobacco use 10/13/2021   History of cerebrovascular accident (CVA) due to ischemia 10/13/2021   PNA (pneumonia) 02/21/2012   Leukocytosis 02/21/2012   Adenocarcinoma of Left Lower Lobe lung    Nodule of left lower lung     Orientation RESPIRATION BLADDER Height & Weight     Self, Time, Situation, Place  O2 (4L) Incontinent Weight: 142 lb 3.2 oz (64.5 kg) Height:  6\' 4"  (193 cm)  BEHAVIORAL SYMPTOMS/MOOD NEUROLOGICAL BOWEL NUTRITION STATUS      Incontinent Diet (Regular, mechanical soft)  AMBULATORY STATUS COMMUNICATION OF NEEDS Skin   Extensive Assist Verbally                         Personal Care Assistance Level of Assistance  Bathing, Feeding, Dressing Bathing Assistance: Maximum assistance Feeding assistance:  Independent Dressing Assistance: Maximum assistance     Functional Limitations Info  Sight, Hearing, Speech Sight Info: Adequate Hearing Info: Adequate Speech Info: Adequate    SPECIAL CARE FACTORS FREQUENCY  PT (By licensed PT), OT (By licensed OT)     PT Frequency: 5x week OT Frequency: 5x week            Contractures Contractures Info: Not present    Additional Factors Info  Code Status, Allergies Code Status Info: Full Allergies Info: NKA           Current Medications (03/18/2023):  This is the current hospital active medication list Current Facility-Administered Medications  Medication Dose Route Frequency Provider Last Rate Last Admin   0.9 %  sodium chloride infusion   Intravenous Continuous Regalado, Belkys A, MD 75 mL/hr at 03/17/23 2310 Infusion Verify at 03/17/23 2310   acetaminophen (TYLENOL) tablet 650 mg  650 mg Oral Q6H PRN Elgergawy, Leana Roe, MD   650 mg at 03/17/23 1711   Or   acetaminophen (TYLENOL) suppository 650 mg  650 mg Rectal Q6H PRN Elgergawy, Leana Roe, MD       ALPRAZolam Prudy Feeler) tablet 0.5 mg  0.5 mg Oral Q12H PRN Elgergawy, Leana Roe, MD   0.5 mg at 03/17/23 2229   alum & mag hydroxide-simeth (MAALOX/MYLANTA) 200-200-20 MG/5ML suspension 30 mL  30 mL Oral Q4H PRN Vassie Loll, MD   30 mL at 03/17/23 1142   cholecalciferol (VITAMIN D3) 25 MCG (1000 UNIT) tablet 2,000 Units  2,000 Units Oral Daily Elgergawy, Leana Roe, MD   2,000 Units at 03/18/23 6295   docusate sodium (COLACE) capsule 100 mg  100 mg Oral Daily PRN Elgergawy, Leana Roe, MD       feeding supplement (ENSURE ENLIVE / ENSURE PLUS) liquid 237 mL  237 mL Oral BID BM Regalado, Belkys A, MD   237 mL at 03/18/23 0813   fluticasone (FLONASE) 50 MCG/ACT nasal spray 1-2 spray  1-2 spray Each Nare Daily Elgergawy, Leana Roe, MD   1 spray at 03/18/23 0920   fluticasone furoate-vilanterol (BREO ELLIPTA) 100-25 MCG/ACT 1 puff  1 puff Inhalation Daily Elgergawy, Leana Roe, MD   1 puff at 03/18/23  0720   And   umeclidinium bromide (INCRUSE ELLIPTA) 62.5 MCG/ACT 1 puff  1 puff Inhalation Daily Elgergawy, Leana Roe, MD   1 puff at 03/18/23 0726   heparin injection 5,000 Units  5,000 Units Subcutaneous Q8H Elgergawy, Leana Roe, MD   5,000 Units at 03/18/23 0521   hydrALAZINE (APRESOLINE) injection 5 mg  5 mg Intravenous Q4H PRN Elgergawy, Leana Roe, MD       ipratropium-albuterol (DUONEB) 0.5-2.5 (3) MG/3ML nebulizer solution 3 mL  3 mL Nebulization Q6H PRN Elgergawy, Leana Roe, MD       ipratropium-albuterol (DUONEB) 0.5-2.5 (3) MG/3ML nebulizer solution 3 mL  3 mL Nebulization Q6H Vassie Loll, MD   3 mL at 03/18/23 0715   metoprolol tartrate (LOPRESSOR) tablet 50 mg  50 mg Oral BID Vassie Loll, MD   50 mg at 03/18/23 2841   mirtazapine (REMERON) tablet 30 mg  30 mg Oral QHS Elgergawy, Leana Roe, MD   30 mg at 03/17/23 2222   pantoprazole (PROTONIX) EC tablet 40 mg  40 mg Oral BID AC Regalado, Belkys A, MD   40 mg at 03/18/23 0821   polyethylene glycol (MIRALAX / GLYCOLAX) packet 17 g  17 g Oral BID Elgergawy, Leana Roe, MD   17 g at 03/18/23 3244   prochlorperazine (COMPAZINE) injection 10 mg  10 mg Intravenous Q6H PRN Vassie Loll, MD       sucralfate (CARAFATE) 1 GM/10ML suspension 1 g  1 g Oral TID Vassie Loll, MD   1 g at 03/18/23 0102   tamsulosin (FLOMAX) capsule 0.4 mg  0.4 mg Oral Daily Elgergawy, Leana Roe, MD   0.4 mg at 03/18/23 7253   theophylline (UNIPHYL) 400 MG 24 hr tablet 400 mg  400 mg Oral Daily Regalado, Belkys A, MD   400 mg at 03/17/23 6644     Discharge Medications: Please see discharge summary for a list of discharge medications.  Relevant Imaging Results:  Relevant Lab Results:   Additional Information    Elliot Gault, LCSW

## 2023-03-18 NOTE — Evaluation (Signed)
Clinical/Bedside Swallow Evaluation Patient Details  Name: Stephen Fitzpatrick MRN: [id number] Date of Birth: 1945/05/13  Today's Date: 03/18/2023 Time: SLP Start Time (ACUTE ONLY): 1106 SLP Stop Time (ACUTE ONLY): 1128 SLP Time Calculation (min) (ACUTE ONLY): 22 min  Past Medical History:  Past Medical History:  Diagnosis Date   Adenocarcinoma of lung (HCC)    CHF (congestive heart failure) (HCC)    COPD (chronic obstructive pulmonary disease) (HCC)    Mass of lung    Nodule of left lung    PONV (postoperative nausea and vomiting)    Past Surgical History:  Past Surgical History:  Procedure Laterality Date   BACK SURGERY     HIP PINNING,CANNULATED Left 10/13/2021   Procedure: CANNULATED HIP PINNING;  Surgeon: Oliver Barre, MD;  Location: [facility];  Service: Orthopedics;  Laterality: Left;   Left video-assisted thoracic surgery, resection  08/24/10   Dr Edwyna Shell   HPI:  77 year old with past medical history significant for carcinoma of the lung, CHF, COPD, GERD, anxiety, chronic hypoxic respiratory failure on 4 L of oxygen, undergoing workup at [facility] oncology for osteoblastic lesion with concern for lung versus prostate malignancy pending PET scan.  Presents from his facility with abnormal labs hemoglobin 7.3, white blood cell 17, progressive weakness and fatigue poor oral intake.  Evaluation in the ED he was found to have a creatinine of 3.19.    Assessment / Plan / Recommendation  Clinical Impression  Pt's oropharyngeal swallowing was found to be normal on clinical swallowing evaluation. Pt is on a full liquid diet but was assessed with thin, puree and regular with permission from MD and Pt consumed all textures and consistencies without overt s/sx of aspiration. From an oropharygneal standpoint recommend advance diet to regular/thin when medically appropriate. There are no further ST needs noted at this time, our service will sign off. Thank you for this referral SLP Visit Diagnosis:  Dysphagia, unspecified (R13.10)    Aspiration Risk  Mild aspiration risk    Diet Recommendation Regular;Thin liquid    Liquid Administration via: Cup;Straw Medication Administration: Whole meds with liquid Supervision: Patient able to self feed Compensations: Minimize environmental distractions Postural Changes: Seated upright at 90 degrees    Other  Recommendations Oral Care Recommendations: Oral care BID    Recommendations for follow up therapy are one component of a multi-disciplinary discharge planning process, led by the attending physician.  Recommendations may be updated based on patient status, additional functional criteria and insurance authorization.  Follow up Recommendations No SLP follow up        Swallow Study   General Date of Onset: 03/15/23 HPI: 77 year old with past medical history significant for carcinoma of the lung, CHF, COPD, GERD, anxiety, chronic hypoxic respiratory failure on 4 L of oxygen, undergoing workup at [facility] oncology for osteoblastic lesion with concern for lung versus prostate malignancy pending PET scan.  Presents from his facility with abnormal labs hemoglobin 7.3, white blood cell 17, progressive weakness and fatigue poor oral intake.  Evaluation in the ED he was found to have a creatinine of 3.19. Type of Study: Bedside Swallow Evaluation Previous Swallow Assessment: none in chart Diet Prior to this Study: Full liquid diet Temperature Spikes Noted: No Respiratory Status: Room air History of Recent Intubation: No Behavior/Cognition: Alert;Cooperative Oral Cavity Assessment: Within Functional Limits Oral Care Completed by SLP: No Oral Cavity - Dentition: Adequate natural dentition;Missing dentition Vision: Functional for self-feeding Self-Feeding Abilities: Able to feed self Patient Positioning: Upright in bed Baseline  Vocal Quality: Normal Volitional Cough: Strong Volitional Swallow: Able to elicit    Oral/Motor/Sensory Function  Overall Oral Motor/Sensory Function: Within functional limits   Ice Chips Ice chips: Within functional limits   Thin Liquid Thin Liquid: Within functional limits    Nectar Thick Nectar Thick Liquid: Not tested   Honey Thick Honey Thick Liquid: Not tested   Puree Puree: Within functional limits   Solid     Solid: Within functional limits     Leilana Mcquire H. Romie Levee, CCC-SLP Speech Language Pathologist  Georgetta Haber 03/18/2023,11:33 AM

## 2023-03-18 NOTE — TOC Transition Note (Addendum)
Transition of Care [facility]) - CM/SW Discharge Note   Patient Details  Name: Stephen Fitzpatrick MRN: [id number] Date of Birth: [birth year]  [facility]) CM/SW Contact:  Elliot Gault, LCSW Phone Number: 03/18/2023, 1:59 PM   Clinical Narrative:     Pt stable for dc today per MD. Whitney Post at [facility] states pt can return with SNF auth pending. Berkley Harvey has been started. Updated MD. Awaiting dc summary and orders. DC clinical will be sent electronically when available. RN to call report. Will arrange EMS when pt has orders.  No other TOC needs for dc.  EMS called [phone]  Final next level of care: Skilled Nursing Facility Barriers to Discharge: Insurance Authorization   Patient Goals and CMS Choice      Discharge Placement                         Discharge Plan and Services Additional resources added to the After Visit Summary for                                       Social Determinants of Health (SDOH) Interventions SDOH Screenings   Food Insecurity: No Food Insecurity (03/15/2023)  Housing: Patient Declined (03/15/2023)  Transportation Needs: No Transportation Needs (03/15/2023)  Utilities: Not At Risk (03/15/2023)  Financial Resource Strain: Patient Declined (02/19/2023)   Received from [facility]  Social Connections: Unknown (02/01/2023)   Received from [facility]  Stress: No Stress Concern Present (02/03/2023)   Received from [facility]  Tobacco Use: Medium Risk (03/15/2023)  Health Literacy: Low Risk  (12/06/2019)   Received from [facility]     Readmission Risk Interventions    01/07/2023    1:35 PM 10/16/2021    2:06 PM  Readmission Risk Prevention Plan  Post Dischage Appt Not Complete   Medication Screening Complete   Transportation Screening Complete Complete  Home Care Screening  Complete  Medication Review (RN CM)  Complete

## 2023-03-25 DIAGNOSIS — E559 Vitamin D deficiency, unspecified: Secondary | ICD-10-CM | POA: Insufficient documentation

## 2023-03-25 DIAGNOSIS — G4700 Insomnia, unspecified: Secondary | ICD-10-CM | POA: Insufficient documentation

## 2023-03-25 DIAGNOSIS — Z85118 Personal history of other malignant neoplasm of bronchus and lung: Secondary | ICD-10-CM | POA: Insufficient documentation

## 2023-03-25 DIAGNOSIS — G8929 Other chronic pain: Secondary | ICD-10-CM | POA: Insufficient documentation

## 2023-03-25 NOTE — Progress Notes (Unsigned)
Name: Stephen Fitzpatrick DOB: Jun 06, 1945 MRN: [id number]  History of Present Illness: Stephen Fitzpatrick is a 77 y.o. male who presents today as a new patient at [facility] [place]. All available relevant medical records have been reviewed. He is accompanied by his son Stephen Fitzpatrick.  Recent history:  > 02/01/2023: CT chest/abdomen/pelvis w/o contrast at [facility] showed significant constipation.   > 02/05/2023: RUS at [facility] showed no GU stones, masses, or hydronephrosis; bladder unremarkable.  > 03/15/2023 - 03/18/2023:  - Admitted for anemia, AKI, and failure to thrive. - AKI attributed to ACE use, hypovolemia secondary to poor oral intake, and Celebrex. - Creatinine elevated to 3.15 upon admission; improved to 1.20 on day of discharge with GFR >60. - Per discharge summary he is "undergoing workup at [facility] oncology for osteoblastic lesion with concern for lung versus prostate malignancy pending PET scan."  > 03/25/2023: PET scan showed PSMA avid osseous metastases - Intense uptake in the right lateral prostate gland, consistent with known disease. He has a history of lung cancer which was first diagnosed in 2004; underwent partial left pneumonectomy.   PSA values: - 02/09/2013: 1.9 - 02/06/2023: 10.1 - 02/19/2023: 21.8  Today: He presents as referred by his oncologist for prostate cancer. He reports urinary urgency. He denies increased urinary frequency, nocturia, incontinence, dysuria, gross hematuria, weak urinary stream, hesitancy, straining to void, or sensations of incomplete emptying. Currently taking Flomax 0.4 mg daily.  He denies flank pain or abdominal pain. He denies fevers or vomiting; reports nausea for which he takes Zofran. Denies constipation now but has a history of that; managed with Miralax and Colace per med list from SNF.   Fall Screening: Do you usually have a device to assist in your mobility? Yes  - wheelchair  Medications: Current  Outpatient Medications  Medication Sig Dispense Refill   acetaminophen (TYLENOL) 325 MG tablet Take 650 mg by mouth every 6 (six) hours as needed for mild pain.     albuterol (VENTOLIN HFA) 108 (90 Base) MCG/ACT inhaler Inhale 2 puffs into the lungs every 6 (six) hours as needed for wheezing or shortness of breath.     ALPRAZolam (XANAX) 0.5 MG tablet Take 1 tablet (0.5 mg total) by mouth every 12 (twelve) hours as needed for anxiety. For 14 days 15 tablet 0   Cholecalciferol 50 MCG (2000 UT) TABS Take 1 tablet by mouth daily.     Dextrose-Sodium Chloride (DEXTROSE 5 % AND 0.9% NACL) 5-0.9 % Inject 60 mLs into the vein once. 60 ml x2 liters via clysis. For 3 days.     docusate sodium (COLACE) 100 MG capsule Take 100 mg by mouth daily as needed for mild constipation.     feeding supplement (ENSURE ENLIVE / ENSURE PLUS) LIQD Take 237 mLs by mouth 2 (two) times daily between meals. 237 mL 12   fluticasone (FLONASE) 50 MCG/ACT nasal spray Place 1-2 sprays into both nostrils daily.     Fluticasone-Umeclidin-Vilant (TRELEGY ELLIPTA) 100-62.5-25 MCG/ACT AEPB Inhale 1 puff into the lungs daily.     ipratropium-albuterol (DUONEB) 0.5-2.5 (3) MG/3ML SOLN Take 3 mLs by nebulization in the morning and at bedtime.     ipratropium-albuterol (DUONEB) 0.5-2.5 (3) MG/3ML SOLN Take 3 mLs by nebulization every 6 (six) hours as needed (respiratory disease/disorder give as needed).     Menthol-Zinc Oxide 0.44-20.625 % OINT Apply 1 Application topically in the morning, at noon, and at bedtime. To buttocks     metoprolol tartrate (LOPRESSOR)  50 MG tablet Take 1 tablet (50 mg total) by mouth 2 (two) times daily. (Patient taking differently: Take 50 mg by mouth daily.) 60 tablet 1   mirtazapine (REMERON) 30 MG tablet Take 30 mg by mouth at bedtime.     Multiple Vitamin (MULTIVITAMIN WITH MINERALS) TABS tablet Take 1 tablet by mouth daily.     OXYGEN Inhale 4 L into the lungs continuous. Every hour     pantoprazole  (PROTONIX) 40 MG tablet Take 1 tablet (40 mg total) by mouth 2 (two) times daily. 60 tablet 1   polyethylene glycol (MIRALAX / GLYCOLAX) 17 g packet Take 17 g by mouth daily. 30 each 2   prochlorperazine (COMPAZINE) 10 MG tablet Take 1 tablet (10 mg total) by mouth every 6 (six) hours as needed for nausea or vomiting. 30 tablet 0   sucralfate (CARAFATE) 1 GM/10ML suspension Take 10 mLs (1 g total) by mouth 3 (three) times daily. 420 mL 0   tamsulosin (FLOMAX) 0.4 MG CAPS capsule Take 1 capsule (0.4 mg total) by mouth daily. 30 capsule 1   theophylline (THEODUR) 200 MG 12 hr tablet Take 200 mg by mouth 2 (two) times daily.     Current Facility-Administered Medications  Medication Dose Route Frequency Provider Last Rate Last Admin   degarelix (FIRMAGON) injection 240 mg  240 mg Subcutaneous Once         Allergies: No Known Allergies  Past Medical History:  Diagnosis Date   Adenocarcinoma of lung (HCC)    CHF (congestive heart failure) (HCC)    COPD (chronic obstructive pulmonary disease) (HCC)    Mass of lung    Nodule of left lung    PONV (postoperative nausea and vomiting)    Past Surgical History:  Procedure Laterality Date   BACK SURGERY     HIP PINNING,CANNULATED Left 10/13/2021   Procedure: CANNULATED HIP PINNING;  Surgeon: Oliver Barre, MD;  Location: [facility];  Service: Orthopedics;  Laterality: Left;   Left video-assisted thoracic surgery, resection  08/24/10   Dr Edwyna Shell   History reviewed. No pertinent family history. Social History   Socioeconomic History   Marital status: Married    Spouse name: Not on file   Number of children: Not on file   Years of education: Not on file   Highest education level: Not on file  Occupational History   Not on file  Tobacco Use   Smoking status: Former    Current packs/day: 0.00    Types: Cigarettes    Quit date: 08/24/2010    Years since quitting: 12.5   Smokeless tobacco: Never  Substance and Sexual Activity   Alcohol use:  No   Drug use: No   Sexual activity: Never  Other Topics Concern   Not on file  Social History Narrative   Not on file   Social Drivers of Health   Financial Resource Strain: Patient Declined (02/19/2023)   Received from [facility]   Overall Financial Resource Strain (CARDIA)    Difficulty of Paying Living Expenses: Patient declined  Food Insecurity: No Food Insecurity (03/15/2023)   Hunger Vital Sign    Worried About Running Out of Food in the Last Year: Never true    Ran Out of Food in the Last Year: Never true  Transportation Needs: No Transportation Needs (03/15/2023)   PRAPARE - Administrator, Civil Service (Medical): No    Lack of Transportation (Non-Medical): No  Physical Activity: Not on file  Stress: No Stress Concern Present (02/03/2023)   Received from [facility] - Occupational Stress Questionnaire    Feeling of Stress : Not at all  Social Connections: Unknown (02/01/2023)   [facility] from [facility]   Social Network    Social Network: Not on file  Intimate Partner Violence: Not At Risk (03/15/2023)   Humiliation, Afraid, Rape, and Kick questionnaire    Fear of Current or Ex-Partner: No    Emotionally Abused: No    Physically Abused: No    Sexually Abused: No    SUBJECTIVE  Review of Systems Constitutional: Patient denies any unintentional weight loss or change in strength lntegumentary: Patient denies any rashes or pruritus Cardiovascular: Patient denies chest pain or syncope Respiratory: Patient denies shortness of breath Gastrointestinal: Patient denies nausea, vomiting, constipation, or diarrhea Musculoskeletal: Patient reports left leg pain since fracture there Neurologic: Patient denies convulsions or seizures Allergic/Immunologic: Patient denies recent allergic reaction(s) Hematologic/Lymphatic: Patient denies bleeding tendencies Endocrine: Patient denies heat/cold intolerance  GU: As  per HPI.  OBJECTIVE Vitals:   03/27/23 0914  BP: (!) 143/80  Pulse: (!) 103  Temp: 98 F (36.7 C)   There is no height or weight on file to calculate BMI.  Physical Examination Constitutional: No obvious distress; patient is non-toxic appearing  Cardiovascular: No visible lower extremity edema.  Respiratory: The patient does not have audible wheezing/stridor; respirations appear mildly labored; he is on oxygen nasal cannula Gastrointestinal: Abdomen non-distended Musculoskeletal: Normal ROM of UEs  Skin: No obvious rashes/open sores  Neurologic: CN 2-12 grossly intact Psychiatric: Answered questions appropriately with normal affect  Hematologic/Lymphatic/Immunologic: No obvious bruises or sites of spontaneous bleeding  No UA; patient denied urge to void while here PVR: 3 ml  ASSESSMENT Prostate cancer (HCC) - Plan: degarelix (FIRMAGON) injection 240 mg, PSA, total and free  Elevated PSA - Plan: degarelix (FIRMAGON) injection 240 mg, PSA, total and free  Hospital discharge follow-up - Plan: Urinalysis, Routine w reflex microscopic, BLADDER SCAN AMB NON-IMAGING  AKI (acute kidney injury) (HCC) - Plan: Urinalysis, Routine w reflex microscopic, BLADDER SCAN AMB NON-IMAGING, Basic metabolic panel  History of hydronephrosis - Plan: Urinalysis, Routine w reflex microscopic, BLADDER SCAN AMB NON-IMAGING, Basic metabolic panel  Adenocarcinoma of left lung (HCC)  Cancer, metastatic to bone [facility])  We discussed his current situation in detail and PET scan results indicating that his lung cancer has metastasized to bones and prostate. No evidence of bladder outlet obstruction based on normal PVR today. Dr. Ronne Binning was consulted and advised initiation of androgen-deprivation therapy (ADT) with Firmagon 240 mg IM. We discussed the rationale for that to suppress the prostate cancer. Will proceed with Deborra Medina prior authorization and plan on nurse visit in 2 weeks to administer, followed by  visit 6 weeks from now with MD at which time he will receive Firmagon 80 mg IM. Agreed to recheck PSA & BMP today. He was advised to continue routine follow up with his PCP and Oncology. Pt & son verbalized understanding and agreement. All questions were answered.  PLAN Advised the following: Labs today (PSA & BMP).  Return in about 2 weeks (around 04/10/2023) for nurse visit for Firmagon 240 mg IM injection. Follow up with Urology MD in 6 weeks.  Orders Placed This Encounter  Procedures   Urinalysis, Routine w reflex microscopic   PSA, total and free   Basic metabolic panel   BLADDER SCAN AMB NON-IMAGING   Total time spent caring for  the patient today was over 45 minutes. This includes time spent on the date of the visit reviewing the patient's chart before the visit, time spent during the visit, and time spent after the visit on documentation. Over 50% of that time was spent in face-to-face time with this patient for direct counseling. E&M based on time and complexity of medical decision making.  It has been explained that the patient is to follow regularly with their PCP in addition to all other providers involved in their care and to follow instructions provided by these respective offices. Patient advised to contact urology clinic if any urologic-pertaining questions, concerns, new symptoms or problems arise in the interim period.  There are no Patient Instructions on file for this visit.  Electronically signed by:  Donnita Falls, MSN, FNP-C, CUNP 03/27/2023 11:34 AM

## 2023-03-27 ENCOUNTER — Ambulatory Visit (INDEPENDENT_AMBULATORY_CARE_PROVIDER_SITE_OTHER): Payer: Medicare HMO | Admitting: Urology

## 2023-03-27 ENCOUNTER — Encounter: Payer: Self-pay | Admitting: Urology

## 2023-03-27 VITALS — BP 143/80 | HR 103 | Temp 98.0°F

## 2023-03-27 DIAGNOSIS — C7951 Secondary malignant neoplasm of bone: Secondary | ICD-10-CM | POA: Diagnosis not present

## 2023-03-27 DIAGNOSIS — C61 Malignant neoplasm of prostate: Secondary | ICD-10-CM | POA: Insufficient documentation

## 2023-03-27 DIAGNOSIS — C3492 Malignant neoplasm of unspecified part of left bronchus or lung: Secondary | ICD-10-CM | POA: Diagnosis not present

## 2023-03-27 DIAGNOSIS — Z09 Encounter for follow-up examination after completed treatment for conditions other than malignant neoplasm: Secondary | ICD-10-CM

## 2023-03-27 DIAGNOSIS — Z87448 Personal history of other diseases of urinary system: Secondary | ICD-10-CM

## 2023-03-27 DIAGNOSIS — R972 Elevated prostate specific antigen [PSA]: Secondary | ICD-10-CM | POA: Diagnosis not present

## 2023-03-27 DIAGNOSIS — C7982 Secondary malignant neoplasm of genital organs: Secondary | ICD-10-CM

## 2023-03-27 DIAGNOSIS — N179 Acute kidney failure, unspecified: Secondary | ICD-10-CM

## 2023-03-27 LAB — BLADDER SCAN AMB NON-IMAGING: Scan Result: 3

## 2023-03-27 MED ORDER — DEGARELIX ACETATE(240 MG DOSE) 120 MG/VIAL ~~LOC~~ SOLR: 240.0000 mg | Freq: Once | SUBCUTANEOUS | Status: DC

## 2023-04-16 ENCOUNTER — Ambulatory Visit: Payer: Medicare HMO

## 2023-04-19 ENCOUNTER — Ambulatory Visit: Payer: Medicare HMO

## 2023-05-13 ENCOUNTER — Ambulatory Visit: Payer: Medicare HMO | Admitting: Urology

## 2023-05-13 DIAGNOSIS — C61 Malignant neoplasm of prostate: Secondary | ICD-10-CM

## 2023-08-08 DEATH — deceased
